# Patient Record
Sex: Female | Born: 1950 | Race: Black or African American | Hispanic: No | Marital: Married | Smoking: Never smoker
Health system: Southern US, Community
[De-identification: ages and names within clinical notes are randomized; demographics above are authoritative.]

## PROBLEM LIST (undated history)

## (undated) DIAGNOSIS — D649 Anemia, unspecified: Secondary | ICD-10-CM

## (undated) DIAGNOSIS — Z5189 Encounter for other specified aftercare: Secondary | ICD-10-CM

## (undated) DIAGNOSIS — L309 Dermatitis, unspecified: Secondary | ICD-10-CM

## (undated) DIAGNOSIS — S22000A Wedge compression fracture of unspecified thoracic vertebra, initial encounter for closed fracture: Secondary | ICD-10-CM

## (undated) DIAGNOSIS — R000 Tachycardia, unspecified: Secondary | ICD-10-CM

## (undated) DIAGNOSIS — Z9481 Bone marrow transplant status: Secondary | ICD-10-CM

## (undated) DIAGNOSIS — IMO0001 Reserved for inherently not codable concepts without codable children: Secondary | ICD-10-CM

## (undated) DIAGNOSIS — I2699 Other pulmonary embolism without acute cor pulmonale: Secondary | ICD-10-CM

## (undated) DIAGNOSIS — C801 Malignant (primary) neoplasm, unspecified: Secondary | ICD-10-CM

## (undated) HISTORY — DX: Reserved for inherently not codable concepts without codable children: IMO0001

## (undated) HISTORY — DX: Tachycardia, unspecified: R00.0

## (undated) HISTORY — DX: Bone marrow transplant status: Z94.81

## (undated) HISTORY — DX: Anemia, unspecified: D64.9

## (undated) HISTORY — DX: Dermatitis, unspecified: L30.9

## (undated) HISTORY — DX: Encounter for other specified aftercare: Z51.89

## (undated) HISTORY — DX: Malignant (primary) neoplasm, unspecified: C80.1

## (undated) HISTORY — DX: Other pulmonary embolism without acute cor pulmonale: I26.99

## (undated) HISTORY — DX: Wedge compression fracture of unspecified thoracic vertebra, initial encounter for closed fracture: S22.000A

---

## 2007-03-06 DIAGNOSIS — C801 Malignant (primary) neoplasm, unspecified: Secondary | ICD-10-CM

## 2007-03-06 HISTORY — DX: Malignant (primary) neoplasm, unspecified: C80.1

## 2007-10-04 DIAGNOSIS — S22000A Wedge compression fracture of unspecified thoracic vertebra, initial encounter for closed fracture: Secondary | ICD-10-CM

## 2007-10-04 HISTORY — DX: Wedge compression fracture of unspecified thoracic vertebra, initial encounter for closed fracture: S22.000A

## 2007-10-30 ENCOUNTER — Ambulatory Visit: Payer: Self-pay | Admitting: Oncology

## 2007-10-30 LAB — CBC WITH DIFFERENTIAL/PLATELET
Basophils Absolute: 0 10*3/uL (ref 0.0–0.1)
Eosinophils Absolute: 0.1 10*3/uL (ref 0.0–0.5)
HGB: 7.6 g/dL — ABNORMAL LOW (ref 11.6–15.9)
LYMPH%: 31.2 % (ref 14.0–48.0)
MCV: 88.7 fL (ref 81.0–101.0)
MONO#: 0.6 10*3/uL (ref 0.1–0.9)
NEUT#: 3.4 10*3/uL (ref 1.5–6.5)
Platelets: 342 10*3/uL (ref 145–400)
RBC: 2.54 10*6/uL — ABNORMAL LOW (ref 3.70–5.32)
RDW: 16.5 % — ABNORMAL HIGH (ref 11.3–14.5)
WBC: 6 10*3/uL (ref 3.9–10.0)

## 2007-10-30 LAB — CHCC SMEAR

## 2007-10-31 ENCOUNTER — Ambulatory Visit (HOSPITAL_COMMUNITY): Admission: RE | Admit: 2007-10-31 | Discharge: 2007-10-31 | Payer: Self-pay | Admitting: Oncology

## 2007-10-31 ENCOUNTER — Other Ambulatory Visit: Admission: RE | Admit: 2007-10-31 | Discharge: 2007-10-31 | Payer: Self-pay | Admitting: Oncology

## 2007-10-31 ENCOUNTER — Encounter: Payer: Self-pay | Admitting: Oncology

## 2007-10-31 LAB — URINALYSIS, MICROSCOPIC - CHCC
Bilirubin (Urine): NEGATIVE
Specific Gravity, Urine: 1.025 (ref 1.003–1.035)
pH: 6 (ref 4.6–8.0)

## 2007-10-31 LAB — COMPREHENSIVE METABOLIC PANEL
Alkaline Phosphatase: 46 U/L (ref 39–117)
Creatinine, Ser: 1.42 mg/dL — ABNORMAL HIGH (ref 0.40–1.20)
Glucose, Bld: 110 mg/dL — ABNORMAL HIGH (ref 70–99)
Sodium: 130 mEq/L — ABNORMAL LOW (ref 135–145)
Total Bilirubin: 0.6 mg/dL (ref 0.3–1.2)
Total Protein: 12.7 g/dL — ABNORMAL HIGH (ref 6.0–8.3)

## 2007-11-04 ENCOUNTER — Encounter (HOSPITAL_COMMUNITY): Admission: RE | Admit: 2007-11-04 | Discharge: 2007-11-22 | Payer: Self-pay | Admitting: Oncology

## 2007-11-04 LAB — TYPE & CROSSMATCH - CHCC

## 2007-11-04 LAB — SPEP & IFE WITH QIG
Alpha-2-Globulin: 7.6 % (ref 7.1–11.8)
Gamma Globulin: 51.9 % — ABNORMAL HIGH (ref 11.1–18.8)
IgG (Immunoglobin G), Serum: 10800 mg/dL — ABNORMAL HIGH (ref 694–1618)
IgM, Serum: 31 mg/dL — ABNORMAL LOW (ref 60–263)
M-Spike, %: 6.62 g/dL

## 2007-11-04 LAB — COMPREHENSIVE METABOLIC PANEL

## 2007-11-04 LAB — KAPPA/LAMBDA LIGHT CHAINS
Kappa free light chain: 141 mg/dL — ABNORMAL HIGH (ref 0.33–1.94)
Kappa:Lambda Ratio: 67.79 — ABNORMAL HIGH (ref 0.26–1.65)

## 2007-11-04 LAB — PREGNANCY, URINE: Preg Test, Ur: NEGATIVE

## 2007-11-04 LAB — BETA 2 MICROGLOBULIN, SERUM: Beta-2 Microglobulin: 4.07 mg/L — ABNORMAL HIGH (ref 1.01–1.73)

## 2007-11-04 LAB — WHOLE BLOOD GLUCOSE: HRS PC: 1 Hours

## 2007-11-13 LAB — CBC WITH DIFFERENTIAL/PLATELET
BASO%: 2.9 % — ABNORMAL HIGH (ref 0.0–2.0)
Basophils Absolute: 0.2 10*3/uL — ABNORMAL HIGH (ref 0.0–0.1)
EOS%: 2.6 % (ref 0.0–7.0)
HGB: 10.2 g/dL — ABNORMAL LOW (ref 11.6–15.9)
MCH: 28.1 pg (ref 26.0–34.0)
MCHC: 32.4 g/dL (ref 32.0–36.0)
MCV: 86.7 fL (ref 81.0–101.0)
MONO%: 9.4 % (ref 0.0–13.0)
NEUT%: 70.6 % (ref 39.6–76.8)
RDW: 16.7 % — ABNORMAL HIGH (ref 11.3–14.5)
lymph#: 1.2 10*3/uL (ref 0.9–3.3)

## 2007-11-20 ENCOUNTER — Ambulatory Visit (HOSPITAL_COMMUNITY): Admission: RE | Admit: 2007-11-20 | Discharge: 2007-11-20 | Payer: Self-pay | Admitting: Oncology

## 2007-11-20 DIAGNOSIS — I2699 Other pulmonary embolism without acute cor pulmonale: Secondary | ICD-10-CM

## 2007-11-20 HISTORY — DX: Other pulmonary embolism without acute cor pulmonale: I26.99

## 2007-11-20 LAB — CBC WITH DIFFERENTIAL/PLATELET
BASO%: 1 % (ref 0.0–2.0)
EOS%: 2.5 % (ref 0.0–7.0)
LYMPH%: 21.6 % (ref 14.0–48.0)
MCHC: 32.8 g/dL (ref 32.0–36.0)
MCV: 86.5 fL (ref 81.0–101.0)
MONO%: 8 % (ref 0.0–13.0)
Platelets: 299 10*3/uL (ref 145–400)
RBC: 3.6 10*6/uL — ABNORMAL LOW (ref 3.70–5.32)
RDW: 16.8 % — ABNORMAL HIGH (ref 11.3–14.5)
WBC: 6 10*3/uL (ref 3.9–10.0)

## 2007-11-20 LAB — COMPREHENSIVE METABOLIC PANEL
ALT: 9 U/L (ref 0–35)
BUN: 11 mg/dL (ref 6–23)
CO2: 20 mEq/L (ref 19–32)
Creatinine, Ser: 0.91 mg/dL (ref 0.40–1.20)
Total Bilirubin: 0.4 mg/dL (ref 0.3–1.2)

## 2007-11-20 LAB — TSH: TSH: 1.32 u[IU]/mL (ref 0.350–4.500)

## 2007-11-21 ENCOUNTER — Encounter: Payer: Self-pay | Admitting: Oncology

## 2007-11-21 ENCOUNTER — Ambulatory Visit: Payer: Self-pay | Admitting: *Deleted

## 2007-11-21 ENCOUNTER — Ambulatory Visit: Admission: RE | Admit: 2007-11-21 | Discharge: 2007-11-21 | Payer: Self-pay | Admitting: Oncology

## 2007-11-25 LAB — CBC WITH DIFFERENTIAL/PLATELET
Basophils Absolute: 0.1 10*3/uL (ref 0.0–0.1)
EOS%: 6 % (ref 0.0–7.0)
Eosinophils Absolute: 0.3 10*3/uL (ref 0.0–0.5)
HCT: 31.1 % — ABNORMAL LOW (ref 34.8–46.6)
HGB: 10.1 g/dL — ABNORMAL LOW (ref 11.6–15.9)
MCH: 28.5 pg (ref 26.0–34.0)
MONO#: 0.4 10*3/uL (ref 0.1–0.9)
NEUT#: 3.4 10*3/uL (ref 1.5–6.5)
NEUT%: 61.1 % (ref 39.6–76.8)
RDW: 16.7 % — ABNORMAL HIGH (ref 11.3–14.5)
lymph#: 1.4 10*3/uL (ref 0.9–3.3)

## 2007-11-25 LAB — PROTIME-INR: INR: 1.2 — ABNORMAL LOW (ref 2.00–3.50)

## 2007-11-27 ENCOUNTER — Encounter: Payer: Self-pay | Admitting: Family Medicine

## 2007-11-27 LAB — PROTIME-INR
INR: 1.8 — ABNORMAL LOW (ref 2.00–3.50)
Protime: 21.6 Seconds — ABNORMAL HIGH (ref 10.6–13.4)

## 2007-11-28 LAB — PROTIME-INR: Protime: 28.8 Seconds — ABNORMAL HIGH (ref 10.6–13.4)

## 2007-12-01 LAB — PROTIME-INR: Protime: 26.4 Seconds — ABNORMAL HIGH (ref 10.6–13.4)

## 2007-12-05 ENCOUNTER — Encounter (INDEPENDENT_AMBULATORY_CARE_PROVIDER_SITE_OTHER): Payer: Self-pay | Admitting: Interventional Radiology

## 2007-12-05 ENCOUNTER — Ambulatory Visit (HOSPITAL_COMMUNITY): Admission: RE | Admit: 2007-12-05 | Discharge: 2007-12-05 | Payer: Self-pay | Admitting: Interventional Radiology

## 2007-12-05 HISTORY — PX: FIXATION KYPHOPLASTY THORACIC SPINE: SHX1643

## 2007-12-05 LAB — PROTIME-INR

## 2007-12-09 LAB — PROTIME-INR

## 2007-12-09 LAB — PREGNANCY, URINE: Preg Test, Ur: NEGATIVE

## 2007-12-11 LAB — COMPREHENSIVE METABOLIC PANEL
ALT: 11 U/L (ref 0–35)
AST: 12 U/L (ref 0–37)
Alkaline Phosphatase: 79 U/L (ref 39–117)
Chloride: 104 mEq/L (ref 96–112)
Creatinine, Ser: 0.81 mg/dL (ref 0.40–1.20)
Total Bilirubin: 0.3 mg/dL (ref 0.3–1.2)

## 2007-12-11 LAB — PROTIME-INR
INR: 1.4 — ABNORMAL LOW (ref 2.00–3.50)
Protime: 16.8 Seconds — ABNORMAL HIGH (ref 10.6–13.4)

## 2007-12-11 LAB — CBC WITH DIFFERENTIAL/PLATELET
BASO%: 1 % (ref 0.0–2.0)
EOS%: 0.7 % (ref 0.0–7.0)
HCT: 35.8 % (ref 34.8–46.6)
MCH: 28.4 pg (ref 26.0–34.0)
MCHC: 32.6 g/dL (ref 32.0–36.0)
NEUT%: 72.6 % (ref 39.6–76.8)
RBC: 4.11 10*6/uL (ref 3.70–5.32)
WBC: 5.5 10*3/uL (ref 3.9–10.0)
lymph#: 1.1 10*3/uL (ref 0.9–3.3)

## 2007-12-17 ENCOUNTER — Ambulatory Visit: Payer: Self-pay | Admitting: Oncology

## 2007-12-18 LAB — CBC WITH DIFFERENTIAL/PLATELET
BASO%: 0.9 % (ref 0.0–2.0)
Basophils Absolute: 0.1 10*3/uL (ref 0.0–0.1)
EOS%: 3.5 % (ref 0.0–7.0)
HCT: 35.7 % (ref 34.8–46.6)
LYMPH%: 29.4 % (ref 14.0–48.0)
MCH: 28.3 pg (ref 26.0–34.0)
MCHC: 32.6 g/dL (ref 32.0–36.0)
MCV: 86.9 fL (ref 81.0–101.0)
NEUT%: 60.3 % (ref 39.6–76.8)
Platelets: 257 10*3/uL (ref 145–400)

## 2007-12-19 ENCOUNTER — Encounter: Payer: Self-pay | Admitting: Interventional Radiology

## 2007-12-25 LAB — CBC WITH DIFFERENTIAL/PLATELET
BASO%: 1.5 % (ref 0.0–2.0)
Basophils Absolute: 0.1 10*3/uL (ref 0.0–0.1)
EOS%: 10.6 % — ABNORMAL HIGH (ref 0.0–7.0)
HCT: 32.9 % — ABNORMAL LOW (ref 34.8–46.6)
HGB: 11.1 g/dL — ABNORMAL LOW (ref 11.6–15.9)
LYMPH%: 32.6 % (ref 14.0–48.0)
MCH: 28.4 pg (ref 26.0–34.0)
MCHC: 33.6 g/dL (ref 32.0–36.0)
MONO#: 0.7 10*3/uL (ref 0.1–0.9)
NEUT%: 41.6 % (ref 39.6–76.8)
Platelets: 310 10*3/uL (ref 145–400)

## 2007-12-25 LAB — PROTIME-INR

## 2007-12-30 LAB — CBC WITH DIFFERENTIAL/PLATELET
BASO%: 0.2 % (ref 0.0–2.0)
Basophils Absolute: 0 10*3/uL (ref 0.0–0.1)
HCT: 32.9 % — ABNORMAL LOW (ref 34.8–46.6)
HGB: 11 g/dL — ABNORMAL LOW (ref 11.6–15.9)
LYMPH%: 27.5 % (ref 14.0–48.0)
MCHC: 33.6 g/dL (ref 32.0–36.0)
MONO#: 0.7 10*3/uL (ref 0.1–0.9)
NEUT%: 55.3 % (ref 39.6–76.8)
Platelets: 259 10*3/uL (ref 145–400)
WBC: 6.1 10*3/uL (ref 3.9–10.0)
lymph#: 1.7 10*3/uL (ref 0.9–3.3)

## 2008-01-01 LAB — PROTEIN ELECTROPHORESIS, SERUM
Albumin ELP: 46.2 % — ABNORMAL LOW (ref 55.8–66.1)
Alpha-1-Globulin: 5.4 % — ABNORMAL HIGH (ref 2.9–4.9)
Alpha-2-Globulin: 13.4 % — ABNORMAL HIGH (ref 7.1–11.8)
M-Spike, %: 2.02 g/dL
Total Protein, Serum Electrophoresis: 7.9 g/dL (ref 6.0–8.3)

## 2008-01-01 LAB — COMPREHENSIVE METABOLIC PANEL
ALT: 11 U/L (ref 0–35)
BUN: 9 mg/dL (ref 6–23)
CO2: 23 mEq/L (ref 19–32)
Calcium: 8.2 mg/dL — ABNORMAL LOW (ref 8.4–10.5)
Chloride: 106 mEq/L (ref 96–112)
Creatinine, Ser: 0.81 mg/dL (ref 0.40–1.20)
Glucose, Bld: 77 mg/dL (ref 70–99)

## 2008-01-01 LAB — KAPPA/LAMBDA LIGHT CHAINS
Kappa free light chain: 14.3 mg/dL — ABNORMAL HIGH (ref 0.33–1.94)
Kappa:Lambda Ratio: 8.72 — ABNORMAL HIGH (ref 0.26–1.65)
Lambda Free Lght Chn: 1.64 mg/dL (ref 0.57–2.63)

## 2008-01-08 LAB — CBC WITH DIFFERENTIAL/PLATELET
BASO%: 2.9 % — ABNORMAL HIGH (ref 0.0–2.0)
EOS%: 1.7 % (ref 0.0–7.0)
HCT: 35.8 % (ref 34.8–46.6)
HGB: 11.6 g/dL (ref 11.6–15.9)
MCH: 28.2 pg (ref 26.0–34.0)
MCHC: 32.5 g/dL (ref 32.0–36.0)
MONO#: 0.7 10*3/uL (ref 0.1–0.9)
RDW: 16.7 % — ABNORMAL HIGH (ref 11.3–14.5)
WBC: 5.6 10*3/uL (ref 3.9–10.0)
lymph#: 1.9 10*3/uL (ref 0.9–3.3)

## 2008-01-08 LAB — PROTIME-INR: INR: 3.3 (ref 2.00–3.50)

## 2008-01-08 LAB — PREGNANCY, URINE: Preg Test, Ur: NEGATIVE

## 2008-01-08 LAB — BASIC METABOLIC PANEL
Calcium: 8.4 mg/dL (ref 8.4–10.5)
Creatinine, Ser: 0.75 mg/dL (ref 0.40–1.20)
Sodium: 137 mEq/L (ref 135–145)

## 2008-01-15 LAB — CBC WITH DIFFERENTIAL/PLATELET
Eosinophils Absolute: 0.3 10*3/uL (ref 0.0–0.5)
HGB: 11.8 g/dL (ref 11.6–15.9)
MONO#: 0.3 10*3/uL (ref 0.1–0.9)
MONO%: 5 % (ref 0.0–13.0)
NEUT#: 3.9 10*3/uL (ref 1.5–6.5)
RBC: 4.2 10*6/uL (ref 3.70–5.32)
RDW: 16.1 % — ABNORMAL HIGH (ref 11.3–14.5)
WBC: 6.2 10*3/uL (ref 3.9–10.0)
lymph#: 1.6 10*3/uL (ref 0.9–3.3)

## 2008-01-22 LAB — CBC WITH DIFFERENTIAL/PLATELET
BASO%: 1 % (ref 0.0–2.0)
EOS%: 4.2 % (ref 0.0–7.0)
HCT: 33.5 % — ABNORMAL LOW (ref 34.8–46.6)
LYMPH%: 17.6 % (ref 14.0–48.0)
MCH: 28.8 pg (ref 26.0–34.0)
MCHC: 33.3 g/dL (ref 32.0–36.0)
MONO%: 7.1 % (ref 0.0–13.0)
NEUT%: 70.2 % (ref 39.6–76.8)
Platelets: 243 10*3/uL (ref 145–400)

## 2008-01-27 LAB — CBC WITH DIFFERENTIAL/PLATELET
BASO%: 0.4 % (ref 0.0–2.0)
EOS%: 7.3 % — ABNORMAL HIGH (ref 0.0–7.0)
HCT: 34.9 % (ref 34.8–46.6)
LYMPH%: 29.3 % (ref 14.0–48.0)
MCH: 29.2 pg (ref 26.0–34.0)
MCHC: 33.3 g/dL (ref 32.0–36.0)
MONO#: 0.6 10*3/uL (ref 0.1–0.9)
NEUT%: 52.5 % (ref 39.6–76.8)
Platelets: 218 10*3/uL (ref 145–400)

## 2008-01-27 LAB — COMPREHENSIVE METABOLIC PANEL
Albumin: 3.7 g/dL (ref 3.5–5.2)
CO2: 25 mEq/L (ref 19–32)
Glucose, Bld: 93 mg/dL (ref 70–99)
Sodium: 142 mEq/L (ref 135–145)
Total Bilirubin: 0.4 mg/dL (ref 0.3–1.2)
Total Protein: 7.4 g/dL (ref 6.0–8.3)

## 2008-02-03 ENCOUNTER — Ambulatory Visit: Payer: Self-pay | Admitting: Oncology

## 2008-02-05 LAB — CBC WITH DIFFERENTIAL/PLATELET
BASO%: 2.3 % — ABNORMAL HIGH (ref 0.0–2.0)
HCT: 35.4 % (ref 34.8–46.6)
HGB: 11.7 g/dL (ref 11.6–15.9)
MCHC: 33.1 g/dL (ref 32.0–36.0)
MONO#: 0.7 10*3/uL (ref 0.1–0.9)
NEUT#: 3.3 10*3/uL (ref 1.5–6.5)
NEUT%: 54.6 % (ref 39.6–76.8)
WBC: 6 10*3/uL (ref 3.9–10.0)
lymph#: 1.7 10*3/uL (ref 0.9–3.3)

## 2008-02-05 LAB — PREGNANCY, URINE: Preg Test, Ur: NEGATIVE

## 2008-02-05 LAB — PROTIME-INR

## 2008-02-12 LAB — CBC WITH DIFFERENTIAL/PLATELET
BASO%: 1.3 % (ref 0.0–2.0)
EOS%: 4.6 % (ref 0.0–7.0)
MCH: 28.4 pg (ref 26.0–34.0)
MCHC: 32.9 g/dL (ref 32.0–36.0)
MCV: 86.3 fL (ref 81.0–101.0)
MONO%: 6 % (ref 0.0–13.0)
RBC: 4.61 10*6/uL (ref 3.70–5.32)
RDW: 16.3 % — ABNORMAL HIGH (ref 11.3–14.5)

## 2008-02-12 LAB — PROTIME-INR
INR: 4.5 — ABNORMAL HIGH (ref 2.00–3.50)
Protime: 54 Seconds — ABNORMAL HIGH (ref 10.6–13.4)

## 2008-02-13 LAB — PROTIME-INR

## 2008-02-16 LAB — PROTIME-INR

## 2008-02-19 LAB — CBC WITH DIFFERENTIAL/PLATELET
BASO%: 1.8 % (ref 0.0–2.0)
HCT: 36.5 % (ref 34.8–46.6)
HGB: 12.1 g/dL (ref 11.6–15.9)
MCHC: 33.1 g/dL (ref 32.0–36.0)
MONO#: 0.7 10*3/uL (ref 0.1–0.9)
NEUT#: 3.4 10*3/uL (ref 1.5–6.5)
NEUT%: 57.8 % (ref 39.6–76.8)
WBC: 5.9 10*3/uL (ref 3.9–10.0)
lymph#: 1.4 10*3/uL (ref 0.9–3.3)

## 2008-02-19 LAB — PROTIME-INR

## 2008-03-11 LAB — CBC WITH DIFFERENTIAL/PLATELET
Basophils Absolute: 0.1 10*3/uL (ref 0.0–0.1)
EOS%: 1.4 % (ref 0.0–7.0)
HCT: 38.4 % (ref 34.8–46.6)
HGB: 12.8 g/dL (ref 11.6–15.9)
LYMPH%: 36.5 % (ref 14.0–48.0)
MCH: 29 pg (ref 26.0–34.0)
MCHC: 33.4 g/dL (ref 32.0–36.0)
NEUT%: 46.9 % (ref 39.6–76.8)
Platelets: 241 10*3/uL (ref 145–400)
lymph#: 1.4 10*3/uL (ref 0.9–3.3)

## 2008-03-11 LAB — COMPREHENSIVE METABOLIC PANEL
Alkaline Phosphatase: 79 U/L (ref 39–117)
BUN: 13 mg/dL (ref 6–23)
Glucose, Bld: 111 mg/dL — ABNORMAL HIGH (ref 70–99)
Sodium: 140 mEq/L (ref 135–145)
Total Bilirubin: 0.5 mg/dL (ref 0.3–1.2)

## 2008-03-11 LAB — PROTIME-INR

## 2008-03-11 LAB — PREGNANCY, URINE: Preg Test, Ur: NEGATIVE

## 2008-03-15 LAB — KAPPA/LAMBDA LIGHT CHAINS
Kappa free light chain: 10.2 mg/dL — ABNORMAL HIGH (ref 0.33–1.94)
Lambda Free Lght Chn: 1.45 mg/dL (ref 0.57–2.63)

## 2008-03-15 LAB — PROTEIN ELECTROPHORESIS, SERUM
Albumin ELP: 53.7 % — ABNORMAL LOW (ref 55.8–66.1)
Alpha-1-Globulin: 5.1 % — ABNORMAL HIGH (ref 2.9–4.9)
Alpha-2-Globulin: 14.6 % — ABNORMAL HIGH (ref 7.1–11.8)
Beta 2: 3.5 % (ref 3.2–6.5)

## 2008-03-15 LAB — IGG, IGA, IGM
IgG (Immunoglobin G), Serum: 1510 mg/dL (ref 694–1618)
IgM, Serum: 69 mg/dL (ref 60–263)

## 2008-03-18 ENCOUNTER — Ambulatory Visit: Payer: Self-pay | Admitting: Oncology

## 2008-03-18 LAB — CBC WITH DIFFERENTIAL/PLATELET
Basophils Absolute: 0.1 10*3/uL (ref 0.0–0.1)
Eosinophils Absolute: 0.2 10*3/uL (ref 0.0–0.5)
HGB: 12.4 g/dL (ref 11.6–15.9)
LYMPH%: 24 % (ref 14.0–48.0)
MCV: 86 fL (ref 81.0–101.0)
MONO%: 5.6 % (ref 0.0–13.0)
NEUT#: 3.8 10*3/uL (ref 1.5–6.5)
NEUT%: 65.7 % (ref 39.6–76.8)
Platelets: 196 10*3/uL (ref 145–400)

## 2008-03-25 LAB — PROTIME-INR

## 2008-04-08 LAB — CBC WITH DIFFERENTIAL/PLATELET
Basophils Absolute: 0 10*3/uL (ref 0.0–0.1)
EOS%: 0.5 % (ref 0.0–7.0)
Eosinophils Absolute: 0 10*3/uL (ref 0.0–0.5)
HCT: 38.8 % (ref 34.8–46.6)
HGB: 13.2 g/dL (ref 11.6–15.9)
MCH: 29 pg (ref 26.0–34.0)
MCV: 85.6 fL (ref 81.0–101.0)
MONO%: 3 % (ref 0.0–13.0)
NEUT#: 4.9 10*3/uL (ref 1.5–6.5)
NEUT%: 81.4 % — ABNORMAL HIGH (ref 39.6–76.8)
RDW: 18 % — ABNORMAL HIGH (ref 11.3–14.5)
lymph#: 0.9 10*3/uL (ref 0.9–3.3)

## 2008-04-08 LAB — PREGNANCY, URINE: Preg Test, Ur: NEGATIVE

## 2008-04-08 LAB — PROTIME-INR: INR: 1.7 — ABNORMAL LOW (ref 2.00–3.50)

## 2008-04-08 LAB — COMPREHENSIVE METABOLIC PANEL
ALT: 12 U/L (ref 0–35)
AST: 13 U/L (ref 0–37)
Albumin: 4.4 g/dL (ref 3.5–5.2)
CO2: 19 mEq/L (ref 19–32)
Calcium: 9.5 mg/dL (ref 8.4–10.5)
Chloride: 105 mEq/L (ref 96–112)
Potassium: 4 mEq/L (ref 3.5–5.3)

## 2008-04-22 LAB — CBC WITH DIFFERENTIAL/PLATELET
BASO%: 1.2 % (ref 0.0–2.0)
Basophils Absolute: 0.1 10*3/uL (ref 0.0–0.1)
EOS%: 3.2 % (ref 0.0–7.0)
HCT: 36.3 % (ref 34.8–46.6)
HGB: 12.4 g/dL (ref 11.6–15.9)
LYMPH%: 19.8 % (ref 14.0–48.0)
MCH: 29.8 pg (ref 26.0–34.0)
MCHC: 34.2 g/dL (ref 32.0–36.0)
MCV: 87.4 fL (ref 81.0–101.0)
MONO%: 7.4 % (ref 0.0–13.0)
NEUT%: 68.4 % (ref 39.6–76.8)
Platelets: 201 10*3/uL (ref 145–400)
lymph#: 1.2 10*3/uL (ref 0.9–3.3)

## 2008-04-22 LAB — PROTIME-INR

## 2008-05-04 ENCOUNTER — Ambulatory Visit: Payer: Self-pay | Admitting: Oncology

## 2008-05-06 LAB — CBC WITH DIFFERENTIAL/PLATELET
BASO%: 1.6 % (ref 0.0–2.0)
EOS%: 1.6 % (ref 0.0–7.0)
HCT: 37.2 % (ref 34.8–46.6)
HGB: 12.3 g/dL (ref 11.6–15.9)
MCHC: 33.1 g/dL (ref 31.5–36.0)
MONO#: 0.8 10*3/uL (ref 0.1–0.9)
NEUT%: 47.7 % (ref 38.4–76.8)
RDW: 16.9 % — ABNORMAL HIGH (ref 11.2–14.5)
WBC: 5 10*3/uL (ref 3.9–10.3)
lymph#: 1.7 10*3/uL (ref 0.9–3.3)

## 2008-05-06 LAB — PROTIME-INR
INR: 2 (ref 2.00–3.50)
Protime: 24 Seconds — ABNORMAL HIGH (ref 10.6–13.4)

## 2008-05-06 LAB — PREGNANCY, URINE: Preg Test, Ur: NEGATIVE

## 2008-05-10 LAB — COMPREHENSIVE METABOLIC PANEL
ALT: 11 U/L (ref 0–35)
AST: 10 U/L (ref 0–37)
Alkaline Phosphatase: 51 U/L (ref 39–117)
BUN: 15 mg/dL (ref 6–23)
Calcium: 9 mg/dL (ref 8.4–10.5)
Chloride: 106 mEq/L (ref 96–112)
Creatinine, Ser: 0.89 mg/dL (ref 0.40–1.20)
Total Bilirubin: 0.5 mg/dL (ref 0.3–1.2)

## 2008-05-10 LAB — PROTEIN ELECTROPHORESIS, SERUM
Alpha-1-Globulin: 5.4 % — ABNORMAL HIGH (ref 2.9–4.9)
Alpha-2-Globulin: 14.4 % — ABNORMAL HIGH (ref 7.1–11.8)
Beta Globulin: 5.4 % (ref 4.7–7.2)
Gamma Globulin: 17.5 % (ref 11.1–18.8)
M-Spike, %: 1 g/dL

## 2008-05-10 LAB — KAPPA/LAMBDA LIGHT CHAINS: Lambda Free Lght Chn: 2 mg/dL (ref 0.57–2.63)

## 2008-05-13 LAB — CBC WITH DIFFERENTIAL/PLATELET
BASO%: 0.4 % (ref 0.0–2.0)
Eosinophils Absolute: 0.2 10*3/uL (ref 0.0–0.5)
HCT: 37.5 % (ref 34.8–46.6)
LYMPH%: 28.5 % (ref 14.0–49.7)
MCHC: 32.5 g/dL (ref 31.5–36.0)
MONO#: 0.3 10*3/uL (ref 0.1–0.9)
NEUT#: 3 10*3/uL (ref 1.5–6.5)
Platelets: 190 10*3/uL (ref 145–400)
RBC: 4.44 10*6/uL (ref 3.70–5.45)
WBC: 4.9 10*3/uL (ref 3.9–10.3)
lymph#: 1.4 10*3/uL (ref 0.9–3.3)
nRBC: 0 % (ref 0–0)

## 2008-05-13 LAB — PROTIME-INR: INR: 1.6 — ABNORMAL LOW (ref 2.00–3.50)

## 2008-05-20 LAB — PROTIME-INR: INR: 2.1 (ref 2.00–3.50)

## 2008-05-20 LAB — CBC WITH DIFFERENTIAL/PLATELET
BASO%: 0.3 % (ref 0.0–2.0)
Basophils Absolute: 0 10*3/uL (ref 0.0–0.1)
EOS%: 2.1 % (ref 0.0–7.0)
HCT: 37.2 % (ref 34.8–46.6)
HGB: 12.1 g/dL (ref 11.6–15.9)
LYMPH%: 15.8 % (ref 14.0–49.7)
MCH: 27.5 pg (ref 25.1–34.0)
MCHC: 32.5 g/dL (ref 31.5–36.0)
MCV: 84.5 fL (ref 79.5–101.0)
MONO%: 13.3 % (ref 0.0–14.0)
NEUT%: 68.5 % (ref 38.4–76.8)
Platelets: 220 10*3/uL (ref 145–400)

## 2008-06-03 LAB — CBC WITH DIFFERENTIAL/PLATELET
BASO%: 1.5 % (ref 0.0–2.0)
HCT: 36.8 % (ref 34.8–46.6)
LYMPH%: 20.6 % (ref 14.0–49.7)
MCH: 27.4 pg (ref 25.1–34.0)
MCHC: 32.3 g/dL (ref 31.5–36.0)
MCV: 84.8 fL (ref 79.5–101.0)
MONO#: 0.5 10*3/uL (ref 0.1–0.9)
MONO%: 7.2 % (ref 0.0–14.0)
NEUT%: 69.7 % (ref 38.4–76.8)
Platelets: 397 10*3/uL (ref 145–400)
RBC: 4.34 10*6/uL (ref 3.70–5.45)
WBC: 7.3 10*3/uL (ref 3.9–10.3)
nRBC: 0 % (ref 0–0)

## 2008-06-03 LAB — PROTIME-INR

## 2008-06-22 ENCOUNTER — Ambulatory Visit: Payer: Self-pay | Admitting: Oncology

## 2008-06-24 LAB — CBC WITH DIFFERENTIAL/PLATELET
BASO%: 0.2 % (ref 0.0–2.0)
Eosinophils Absolute: 0.1 10*3/uL (ref 0.0–0.5)
HCT: 36.3 % (ref 34.8–46.6)
MCHC: 33.1 g/dL (ref 31.5–36.0)
MONO#: 0.6 10*3/uL (ref 0.1–0.9)
NEUT#: 1.9 10*3/uL (ref 1.5–6.5)
RBC: 4.28 10*6/uL (ref 3.70–5.45)
WBC: 4.4 10*3/uL (ref 3.9–10.3)
lymph#: 1.7 10*3/uL (ref 0.9–3.3)
nRBC: 0 % (ref 0–0)

## 2008-06-24 LAB — PROTIME-INR

## 2008-06-28 LAB — CBC WITH DIFFERENTIAL/PLATELET
Basophils Absolute: 0 10*3/uL (ref 0.0–0.1)
EOS%: 1.6 % (ref 0.0–7.0)
HCT: 35.2 % (ref 34.8–46.6)
HGB: 11.8 g/dL (ref 11.6–15.9)
LYMPH%: 41.3 % (ref 14.0–49.7)
MCH: 28.6 pg (ref 25.1–34.0)
MCV: 85.1 fL (ref 79.5–101.0)
MONO%: 11.9 % (ref 0.0–14.0)
NEUT%: 44.7 % (ref 38.4–76.8)
Platelets: 278 10*3/uL (ref 145–400)

## 2008-06-28 LAB — COMPREHENSIVE METABOLIC PANEL
AST: 19 U/L (ref 0–37)
Alkaline Phosphatase: 68 U/L (ref 39–117)
BUN: 13 mg/dL (ref 6–23)
Creatinine, Ser: 0.94 mg/dL (ref 0.40–1.20)
Glucose, Bld: 165 mg/dL — ABNORMAL HIGH (ref 70–99)

## 2008-06-28 LAB — PROTIME-INR
INR: 2 (ref 2.00–3.50)
Protime: 24 s — ABNORMAL HIGH (ref 10.6–13.4)

## 2008-07-01 ENCOUNTER — Other Ambulatory Visit: Admission: RE | Admit: 2008-07-01 | Discharge: 2008-07-01 | Payer: Self-pay | Admitting: Oncology

## 2008-07-01 ENCOUNTER — Encounter: Payer: Self-pay | Admitting: Oncology

## 2008-07-01 LAB — PROTIME-INR: INR: 1.3 — ABNORMAL LOW (ref 2.00–3.50)

## 2008-07-09 LAB — CBC WITH DIFFERENTIAL/PLATELET
BASO%: 0.5 % (ref 0.0–2.0)
Basophils Absolute: 0 10*3/uL (ref 0.0–0.1)
EOS%: 0.7 % (ref 0.0–7.0)
HCT: 35.5 % (ref 34.8–46.6)
HGB: 11.9 g/dL (ref 11.6–15.9)
MCH: 28.6 pg (ref 25.1–34.0)
MCHC: 33.5 g/dL (ref 31.5–36.0)
MCV: 85.5 fL (ref 79.5–101.0)
MONO%: 13.1 % (ref 0.0–14.0)
NEUT%: 43.9 % (ref 38.4–76.8)
RDW: 16.5 % — ABNORMAL HIGH (ref 11.2–14.5)

## 2008-07-09 LAB — BASIC METABOLIC PANEL
BUN: 13 mg/dL (ref 6–23)
CO2: 22 mEq/L (ref 19–32)
Chloride: 108 mEq/L (ref 96–112)
Creatinine, Ser: 0.87 mg/dL (ref 0.40–1.20)

## 2008-08-03 ENCOUNTER — Ambulatory Visit: Payer: Self-pay | Admitting: Oncology

## 2008-08-05 LAB — PROTIME-INR: Protime: 28.8 Seconds — ABNORMAL HIGH (ref 10.6–13.4)

## 2008-08-16 LAB — CBC WITH DIFFERENTIAL/PLATELET
BASO%: 0.5 % (ref 0.0–2.0)
Basophils Absolute: 0 10*3/uL (ref 0.0–0.1)
EOS%: 0.7 % (ref 0.0–7.0)
HCT: 34.9 % (ref 34.8–46.6)
HGB: 11.5 g/dL — ABNORMAL LOW (ref 11.6–15.9)
LYMPH%: 19.6 % (ref 14.0–49.7)
MCH: 27.8 pg (ref 25.1–34.0)
MCHC: 33 g/dL (ref 31.5–36.0)
MCV: 84.3 fL (ref 79.5–101.0)
MONO%: 0.8 % (ref 0.0–14.0)
NEUT%: 78.4 % — ABNORMAL HIGH (ref 38.4–76.8)
Platelets: 100 10*3/uL — ABNORMAL LOW (ref 145–400)
lymph#: 1.2 10*3/uL (ref 0.9–3.3)

## 2008-08-18 LAB — CBC WITH DIFFERENTIAL/PLATELET
EOS%: 4.2 % (ref 0.0–7.0)
Eosinophils Absolute: 0.1 10*3/uL (ref 0.0–0.5)
LYMPH%: 88.9 % — ABNORMAL HIGH (ref 14.0–49.7)
MCH: 28.8 pg (ref 25.1–34.0)
MCV: 85 fL (ref 79.5–101.0)
MONO%: 3.4 % (ref 0.0–14.0)
NEUT#: 0 10*3/uL — CL (ref 1.5–6.5)
Platelets: 49 10*3/uL — ABNORMAL LOW (ref 145–400)
RBC: 4.07 10*6/uL (ref 3.70–5.45)
RDW: 15.4 % — ABNORMAL HIGH (ref 11.2–14.5)

## 2008-08-18 LAB — PROTHROMBIN TIME: Prothrombin Time: 19.9 seconds — ABNORMAL HIGH (ref 11.6–15.2)

## 2008-09-10 DIAGNOSIS — Z9481 Bone marrow transplant status: Secondary | ICD-10-CM

## 2008-09-10 HISTORY — DX: Bone marrow transplant status: Z94.81

## 2008-10-08 ENCOUNTER — Ambulatory Visit: Payer: Self-pay | Admitting: Oncology

## 2008-10-12 LAB — CBC WITH DIFFERENTIAL/PLATELET
Basophils Absolute: 0.1 10*3/uL (ref 0.0–0.1)
Eosinophils Absolute: 0.7 10*3/uL — ABNORMAL HIGH (ref 0.0–0.5)
HCT: 35.2 % (ref 34.8–46.6)
LYMPH%: 25.9 % (ref 14.0–49.7)
MCV: 85.7 fL (ref 79.5–101.0)
MONO#: 1.6 10*3/uL — ABNORMAL HIGH (ref 0.1–0.9)
MONO%: 19.4 % — ABNORMAL HIGH (ref 0.0–14.0)
NEUT#: 3.8 10*3/uL (ref 1.5–6.5)
NEUT%: 45.8 % (ref 38.4–76.8)
Platelets: 183 10*3/uL (ref 145–400)
RBC: 4.11 10*6/uL (ref 3.70–5.45)
WBC: 8.2 10*3/uL (ref 3.9–10.3)

## 2008-10-26 LAB — CBC WITH DIFFERENTIAL/PLATELET
BASO%: 0.5 % (ref 0.0–2.0)
EOS%: 6 % (ref 0.0–7.0)
HCT: 34.6 % — ABNORMAL LOW (ref 34.8–46.6)
LYMPH%: 31.8 % (ref 14.0–49.7)
MCH: 29.4 pg (ref 25.1–34.0)
MCHC: 34.2 g/dL (ref 31.5–36.0)
MCV: 86.1 fL (ref 79.5–101.0)
MONO%: 10.2 % (ref 0.0–14.0)
NEUT%: 51.5 % (ref 38.4–76.8)
Platelets: 216 10*3/uL (ref 145–400)
RBC: 4.02 10*6/uL (ref 3.70–5.45)
WBC: 6.5 10*3/uL (ref 3.9–10.3)

## 2008-10-26 LAB — COMPREHENSIVE METABOLIC PANEL
ALT: 21 U/L (ref 0–35)
Alkaline Phosphatase: 90 U/L (ref 39–117)
CO2: 20 mEq/L (ref 19–32)
Creatinine, Ser: 1.12 mg/dL (ref 0.40–1.20)
Sodium: 140 mEq/L (ref 135–145)
Total Bilirubin: 0.5 mg/dL (ref 0.3–1.2)
Total Protein: 7.1 g/dL (ref 6.0–8.3)

## 2008-10-26 LAB — LACTATE DEHYDROGENASE: LDH: 183 U/L (ref 94–250)

## 2008-11-23 ENCOUNTER — Ambulatory Visit: Payer: Self-pay | Admitting: Oncology

## 2008-11-25 LAB — CBC WITH DIFFERENTIAL/PLATELET
Eosinophils Absolute: 0.1 10*3/uL (ref 0.0–0.5)
MCV: 87 fL (ref 79.5–101.0)
MONO#: 0.5 10*3/uL (ref 0.1–0.9)
MONO%: 9.4 % (ref 0.0–14.0)
NEUT#: 2.1 10*3/uL (ref 1.5–6.5)
RBC: 3.89 10*6/uL (ref 3.70–5.45)
RDW: 16.7 % — ABNORMAL HIGH (ref 11.2–14.5)
WBC: 4.9 10*3/uL (ref 3.9–10.3)

## 2008-12-27 ENCOUNTER — Ambulatory Visit: Payer: Self-pay | Admitting: Oncology

## 2009-01-20 LAB — BASIC METABOLIC PANEL
BUN: 10 mg/dL (ref 6–23)
Calcium: 9.3 mg/dL (ref 8.4–10.5)
Glucose, Bld: 114 mg/dL — ABNORMAL HIGH (ref 70–99)

## 2009-01-26 ENCOUNTER — Ambulatory Visit: Payer: Self-pay | Admitting: Oncology

## 2009-01-26 LAB — PREGNANCY, URINE: Preg Test, Ur: NEGATIVE

## 2009-01-26 LAB — CBC WITH DIFFERENTIAL/PLATELET
BASO%: 1 % (ref 0.0–2.0)
MCHC: 32.7 g/dL (ref 31.5–36.0)
MONO#: 0.5 10*3/uL (ref 0.1–0.9)
RBC: 4.23 10*6/uL (ref 3.70–5.45)
RDW: 13.9 % (ref 11.2–14.5)
WBC: 3.8 10*3/uL — ABNORMAL LOW (ref 3.9–10.3)
lymph#: 1.8 10*3/uL (ref 0.9–3.3)

## 2009-02-02 LAB — CBC WITH DIFFERENTIAL/PLATELET
Basophils Absolute: 0.1 10*3/uL (ref 0.0–0.1)
EOS%: 4.6 % (ref 0.0–7.0)
HGB: 12.9 g/dL (ref 11.6–15.9)
MCH: 28.9 pg (ref 25.1–34.0)
NEUT#: 1.4 10*3/uL — ABNORMAL LOW (ref 1.5–6.5)
RBC: 4.46 10*6/uL (ref 3.70–5.45)
RDW: 13.6 % (ref 11.2–14.5)
lymph#: 2 10*3/uL (ref 0.9–3.3)
nRBC: 0 % (ref 0–0)

## 2009-02-10 LAB — CBC WITH DIFFERENTIAL/PLATELET
BASO%: 2 % (ref 0.0–2.0)
Eosinophils Absolute: 0.3 10*3/uL (ref 0.0–0.5)
MCHC: 33.1 g/dL (ref 31.5–36.0)
MONO#: 0.7 10*3/uL (ref 0.1–0.9)
NEUT#: 1.2 10*3/uL — ABNORMAL LOW (ref 1.5–6.5)
Platelets: 221 10*3/uL (ref 145–400)
RBC: 4.79 10*6/uL (ref 3.70–5.45)
RDW: 13.5 % (ref 11.2–14.5)
WBC: 4.9 10*3/uL (ref 3.9–10.3)
lymph#: 2.7 10*3/uL (ref 0.9–3.3)

## 2009-02-18 LAB — CBC WITH DIFFERENTIAL/PLATELET
BASO%: 1.6 % (ref 0.0–2.0)
Eosinophils Absolute: 0.2 10*3/uL (ref 0.0–0.5)
HCT: 42.5 % (ref 34.8–46.6)
HGB: 14.1 g/dL (ref 11.6–15.9)
LYMPH%: 42.8 % (ref 14.0–49.7)
MONO#: 0.5 10*3/uL (ref 0.1–0.9)
NEUT#: 1.6 10*3/uL (ref 1.5–6.5)
NEUT%: 38.2 % — ABNORMAL LOW (ref 38.4–76.8)
Platelets: 245 10*3/uL (ref 145–400)
WBC: 4.3 10*3/uL (ref 3.9–10.3)
lymph#: 1.8 10*3/uL (ref 0.9–3.3)

## 2009-03-24 ENCOUNTER — Ambulatory Visit: Payer: Self-pay | Admitting: Oncology

## 2009-03-24 LAB — CBC WITH DIFFERENTIAL/PLATELET
BASO%: 1.4 % (ref 0.0–2.0)
EOS%: 7.1 % — ABNORMAL HIGH (ref 0.0–7.0)
LYMPH%: 43.8 % (ref 14.0–49.7)
MCH: 28.3 pg (ref 25.1–34.0)
MCHC: 32.7 g/dL (ref 31.5–36.0)
MONO#: 0.6 10*3/uL (ref 0.1–0.9)
RBC: 4.6 10*6/uL (ref 3.70–5.45)
WBC: 4.2 10*3/uL (ref 3.9–10.3)
lymph#: 1.9 10*3/uL (ref 0.9–3.3)

## 2009-03-24 LAB — PREGNANCY, URINE: Preg Test, Ur: NEGATIVE

## 2009-04-14 LAB — CBC WITH DIFFERENTIAL/PLATELET
BASO%: 1.7 % (ref 0.0–2.0)
EOS%: 9.9 % — ABNORMAL HIGH (ref 0.0–7.0)
HGB: 12.9 g/dL (ref 11.6–15.9)
MCH: 28.1 pg (ref 25.1–34.0)
MCHC: 32.6 g/dL (ref 31.5–36.0)
RDW: 14.7 % — ABNORMAL HIGH (ref 11.2–14.5)
lymph#: 1.6 10*3/uL (ref 0.9–3.3)

## 2009-04-14 LAB — BASIC METABOLIC PANEL
BUN: 11 mg/dL (ref 6–23)
Calcium: 9.3 mg/dL (ref 8.4–10.5)
Creatinine, Ser: 0.9 mg/dL (ref 0.40–1.20)

## 2009-05-18 ENCOUNTER — Ambulatory Visit: Payer: Self-pay | Admitting: Oncology

## 2009-05-20 LAB — CBC WITH DIFFERENTIAL/PLATELET
BASO%: 1.3 % (ref 0.0–2.0)
LYMPH%: 31.5 % (ref 14.0–49.7)
MCHC: 33.2 g/dL (ref 31.5–36.0)
MONO#: 0.5 10*3/uL (ref 0.1–0.9)
MONO%: 11.4 % (ref 0.0–14.0)
Platelets: 277 10*3/uL (ref 145–400)
RBC: 4.28 10*6/uL (ref 3.70–5.45)
WBC: 4.2 10*3/uL (ref 3.9–10.3)

## 2009-05-20 LAB — PREGNANCY, URINE: Preg Test, Ur: NEGATIVE

## 2009-06-16 LAB — CBC WITH DIFFERENTIAL/PLATELET
BASO%: 2.9 % — ABNORMAL HIGH (ref 0.0–2.0)
EOS%: 9.4 % — ABNORMAL HIGH (ref 0.0–7.0)
HCT: 40.1 % (ref 34.8–46.6)
LYMPH%: 51.7 % — ABNORMAL HIGH (ref 14.0–49.7)
MCH: 28.6 pg (ref 25.1–34.0)
MCHC: 32.9 g/dL (ref 31.5–36.0)
MCV: 87 fL (ref 79.5–101.0)
MONO%: 15.3 % — ABNORMAL HIGH (ref 0.0–14.0)
NEUT%: 20.7 % — ABNORMAL LOW (ref 38.4–76.8)
Platelets: 203 10*3/uL (ref 145–400)

## 2009-06-21 ENCOUNTER — Ambulatory Visit: Payer: Self-pay | Admitting: Oncology

## 2009-06-23 LAB — CBC WITH DIFFERENTIAL/PLATELET
EOS%: 10 % — ABNORMAL HIGH (ref 0.0–7.0)
Eosinophils Absolute: 0.3 10*3/uL (ref 0.0–0.5)
LYMPH%: 50.3 % — ABNORMAL HIGH (ref 14.0–49.7)
MCH: 28.4 pg (ref 25.1–34.0)
MCHC: 32.7 g/dL (ref 31.5–36.0)
MCV: 86.7 fL (ref 79.5–101.0)
MONO%: 12.4 % (ref 0.0–14.0)
NEUT#: 0.9 10*3/uL — ABNORMAL LOW (ref 1.5–6.5)
Platelets: 231 10*3/uL (ref 145–400)
RBC: 4.51 10*6/uL (ref 3.70–5.45)

## 2009-07-08 LAB — CBC WITH DIFFERENTIAL/PLATELET
BASO%: 1.2 % (ref 0.0–2.0)
Eosinophils Absolute: 0.3 10*3/uL (ref 0.0–0.5)
HCT: 41.8 % (ref 34.8–46.6)
HGB: 13.7 g/dL (ref 11.6–15.9)
LYMPH%: 49.2 % (ref 14.0–49.7)
MCHC: 32.8 g/dL (ref 31.5–36.0)
MONO#: 0.5 10*3/uL (ref 0.1–0.9)
NEUT#: 0.9 10*3/uL — ABNORMAL LOW (ref 1.5–6.5)
NEUT%: 26.3 % — ABNORMAL LOW (ref 38.4–76.8)
Platelets: 216 10*3/uL (ref 145–400)
WBC: 3.2 10*3/uL — ABNORMAL LOW (ref 3.9–10.3)
lymph#: 1.6 10*3/uL (ref 0.9–3.3)

## 2009-07-08 LAB — BASIC METABOLIC PANEL
BUN: 9 mg/dL (ref 6–23)
CO2: 25 mEq/L (ref 19–32)
Chloride: 108 mEq/L (ref 96–112)
Potassium: 3.6 mEq/L (ref 3.5–5.3)

## 2009-07-21 ENCOUNTER — Ambulatory Visit: Payer: Self-pay | Admitting: Oncology

## 2009-07-21 LAB — CBC WITH DIFFERENTIAL/PLATELET
Basophils Absolute: 0.1 10*3/uL (ref 0.0–0.1)
EOS%: 7.4 % — ABNORMAL HIGH (ref 0.0–7.0)
Eosinophils Absolute: 0.3 10*3/uL (ref 0.0–0.5)
HGB: 12.9 g/dL (ref 11.6–15.9)
LYMPH%: 44.8 % (ref 14.0–49.7)
MCH: 28.6 pg (ref 25.1–34.0)
MCV: 86.5 fL (ref 79.5–101.0)
MONO%: 14.6 % — ABNORMAL HIGH (ref 0.0–14.0)
NEUT#: 1.1 10*3/uL — ABNORMAL LOW (ref 1.5–6.5)
Platelets: 220 10*3/uL (ref 145–400)
RBC: 4.51 10*6/uL (ref 3.70–5.45)

## 2009-08-05 LAB — CBC WITH DIFFERENTIAL/PLATELET
BASO%: 1.4 % (ref 0.0–2.0)
EOS%: 6.5 % (ref 0.0–7.0)
LYMPH%: 42.5 % (ref 14.0–49.7)
MCH: 28.7 pg (ref 25.1–34.0)
MCHC: 33.4 g/dL (ref 31.5–36.0)
MONO#: 0.5 10*3/uL (ref 0.1–0.9)
Platelets: 202 10*3/uL (ref 145–400)
RBC: 4.49 10*6/uL (ref 3.70–5.45)
WBC: 3.7 10*3/uL — ABNORMAL LOW (ref 3.9–10.3)
lymph#: 1.6 10*3/uL (ref 0.9–3.3)

## 2009-08-06 LAB — PREGNANCY, URINE: Preg Test, Ur: NEGATIVE

## 2009-09-01 ENCOUNTER — Ambulatory Visit: Payer: Self-pay | Admitting: Oncology

## 2009-09-06 LAB — CBC WITH DIFFERENTIAL/PLATELET
Basophils Absolute: 0.1 10*3/uL (ref 0.0–0.1)
EOS%: 8 % — ABNORMAL HIGH (ref 0.0–7.0)
HCT: 40 % (ref 34.8–46.6)
HGB: 13.1 g/dL (ref 11.6–15.9)
LYMPH%: 47.5 % (ref 14.0–49.7)
MCH: 28.7 pg (ref 25.1–34.0)
MCV: 87.5 fL (ref 79.5–101.0)
MONO%: 14.6 % — ABNORMAL HIGH (ref 0.0–14.0)
NEUT%: 27.4 % — ABNORMAL LOW (ref 38.4–76.8)

## 2009-09-20 LAB — CBC WITH DIFFERENTIAL/PLATELET
BASO%: 2.1 % — ABNORMAL HIGH (ref 0.0–2.0)
LYMPH%: 49.2 % (ref 14.0–49.7)
MCHC: 33.7 g/dL (ref 31.5–36.0)
MCV: 86.3 fL (ref 79.5–101.0)
MONO%: 13.8 % (ref 0.0–14.0)
Platelets: 209 10*3/uL (ref 145–400)
RBC: 4.68 10*6/uL (ref 3.70–5.45)

## 2009-10-05 ENCOUNTER — Ambulatory Visit: Payer: Self-pay | Admitting: Oncology

## 2009-10-07 LAB — BASIC METABOLIC PANEL
CO2: 22 mEq/L (ref 19–32)
Chloride: 106 mEq/L (ref 96–112)
Glucose, Bld: 98 mg/dL (ref 70–99)
Potassium: 3.9 mEq/L (ref 3.5–5.3)
Sodium: 141 mEq/L (ref 135–145)

## 2009-10-07 LAB — CBC WITH DIFFERENTIAL/PLATELET
BASO%: 1.6 % (ref 0.0–2.0)
HCT: 41 % (ref 34.8–46.6)
LYMPH%: 55.1 % — ABNORMAL HIGH (ref 14.0–49.7)
MCHC: 32.9 g/dL (ref 31.5–36.0)
MONO#: 0.4 10*3/uL (ref 0.1–0.9)
NEUT%: 25.1 % — ABNORMAL LOW (ref 38.4–76.8)
Platelets: 219 10*3/uL (ref 145–400)
WBC: 3.9 10*3/uL (ref 3.9–10.3)

## 2009-11-03 ENCOUNTER — Ambulatory Visit: Payer: Self-pay | Admitting: Oncology

## 2009-11-03 ENCOUNTER — Inpatient Hospital Stay (HOSPITAL_COMMUNITY): Admission: AD | Admit: 2009-11-03 | Discharge: 2009-11-11 | Payer: Self-pay | Admitting: Oncology

## 2009-11-03 ENCOUNTER — Ambulatory Visit: Payer: Self-pay | Admitting: Internal Medicine

## 2009-11-03 LAB — URINALYSIS, MICROSCOPIC - CHCC
Ketones: NEGATIVE mg/dL
Nitrite: NEGATIVE
Protein: 300 mg/dL
Specific Gravity, Urine: 1.02 (ref 1.003–1.035)

## 2009-11-03 LAB — CBC WITH DIFFERENTIAL/PLATELET
Basophils Absolute: 0 10*3/uL (ref 0.0–0.1)
Eosinophils Absolute: 0.1 10*3/uL (ref 0.0–0.5)
HCT: 37.2 % (ref 34.8–46.6)
HGB: 12.3 g/dL (ref 11.6–15.9)
LYMPH%: 20.6 % (ref 14.0–49.7)
MCV: 87.3 fL (ref 79.5–101.0)
MONO#: 0.2 10*3/uL (ref 0.1–0.9)
MONO%: 6 % (ref 0.0–14.0)
NEUT#: 2.7 10*3/uL (ref 1.5–6.5)
NEUT%: 70.6 % (ref 38.4–76.8)
Platelets: 180 10*3/uL (ref 145–400)
RBC: 4.26 10*6/uL (ref 3.70–5.45)
WBC: 3.8 10*3/uL — ABNORMAL LOW (ref 3.9–10.3)
nRBC: 0 % (ref 0–0)

## 2009-11-05 ENCOUNTER — Ambulatory Visit: Payer: Self-pay | Admitting: Infectious Disease

## 2009-11-05 LAB — PREGNANCY, URINE: Preg Test, Ur: NEGATIVE

## 2009-11-05 LAB — URINE CULTURE

## 2009-11-14 ENCOUNTER — Ambulatory Visit: Payer: Self-pay | Admitting: Oncology

## 2009-11-17 LAB — CBC WITH DIFFERENTIAL/PLATELET
Basophils Absolute: 0.1 10*3/uL (ref 0.0–0.1)
Eosinophils Absolute: 0.1 10*3/uL (ref 0.0–0.5)
HCT: 37.1 % (ref 34.8–46.6)
HGB: 11.9 g/dL (ref 11.6–15.9)
LYMPH%: 45 % (ref 14.0–49.7)
MONO#: 0.5 10*3/uL (ref 0.1–0.9)
NEUT#: 1.9 10*3/uL (ref 1.5–6.5)
NEUT%: 41.8 % (ref 38.4–76.8)
Platelets: 466 10*3/uL — ABNORMAL HIGH (ref 145–400)
WBC: 4.5 10*3/uL (ref 3.9–10.3)
nRBC: 0 % (ref 0–0)

## 2009-12-09 LAB — CBC WITH DIFFERENTIAL/PLATELET
Basophils Absolute: 0 10*3/uL (ref 0.0–0.1)
EOS%: 3.1 % (ref 0.0–7.0)
HGB: 11.6 g/dL (ref 11.6–15.9)
MCH: 28.2 pg (ref 25.1–34.0)
MCV: 87.1 fL (ref 79.5–101.0)
MONO%: 10.3 % (ref 0.0–14.0)
NEUT#: 1.1 10*3/uL — ABNORMAL LOW (ref 1.5–6.5)
RBC: 4.12 10*6/uL (ref 3.70–5.45)
RDW: 16.2 % — ABNORMAL HIGH (ref 11.2–14.5)
lymph#: 3.1 10*3/uL (ref 0.9–3.3)

## 2009-12-20 ENCOUNTER — Ambulatory Visit: Payer: Self-pay | Admitting: Oncology

## 2009-12-22 LAB — CBC WITH DIFFERENTIAL/PLATELET
Basophils Absolute: 0 10*3/uL (ref 0.0–0.1)
Eosinophils Absolute: 0.2 10*3/uL (ref 0.0–0.5)
HGB: 12.7 g/dL (ref 11.6–15.9)
MCV: 87.6 fL (ref 79.5–101.0)
MONO#: 0.4 10*3/uL (ref 0.1–0.9)
MONO%: 7.7 % (ref 0.0–14.0)
NEUT#: 1.3 10*3/uL — ABNORMAL LOW (ref 1.5–6.5)
RBC: 4.5 10*6/uL (ref 3.70–5.45)
RDW: 16.9 % — ABNORMAL HIGH (ref 11.2–14.5)
WBC: 4.8 10*3/uL (ref 3.9–10.3)
lymph#: 2.9 10*3/uL (ref 0.9–3.3)

## 2010-01-12 LAB — PREGNANCY, URINE: Preg Test, Ur: NEGATIVE

## 2010-01-18 LAB — PROTEIN ELECTROPHORESIS, SERUM
Alpha-2-Globulin: 12.3 % — ABNORMAL HIGH (ref 7.1–11.8)
Gamma Globulin: 17.7 % (ref 11.1–18.8)
Total Protein, Serum Electrophoresis: 7.4 g/dL (ref 6.0–8.3)

## 2010-01-18 LAB — BASIC METABOLIC PANEL
BUN: 11 mg/dL (ref 6–23)
Calcium: 9.2 mg/dL (ref 8.4–10.5)
Creatinine, Ser: 0.92 mg/dL (ref 0.40–1.20)
Glucose, Bld: 112 mg/dL — ABNORMAL HIGH (ref 70–99)
Potassium: 3.8 mEq/L (ref 3.5–5.3)

## 2010-01-18 LAB — KAPPA/LAMBDA LIGHT CHAINS
Kappa free light chain: 1.71 mg/dL (ref 0.33–1.94)
Lambda Free Lght Chn: 2.4 mg/dL (ref 0.57–2.63)

## 2010-02-10 LAB — CBC WITH DIFFERENTIAL/PLATELET
BASO%: 1.3 % (ref 0.0–2.0)
Eosinophils Absolute: 0.2 10*3/uL (ref 0.0–0.5)
LYMPH%: 56.5 % — ABNORMAL HIGH (ref 14.0–49.7)
MCHC: 33.2 g/dL (ref 31.5–36.0)
MONO#: 0.4 10*3/uL (ref 0.1–0.9)
MONO%: 9.4 % (ref 0.0–14.0)
NEUT#: 1.1 10*3/uL — ABNORMAL LOW (ref 1.5–6.5)
RBC: 4.79 10*6/uL (ref 3.70–5.45)
RDW: 15.4 % — ABNORMAL HIGH (ref 11.2–14.5)
WBC: 4 10*3/uL (ref 3.9–10.3)

## 2010-02-10 LAB — PREGNANCY, URINE: Preg Test, Ur: NEGATIVE

## 2010-02-15 ENCOUNTER — Ambulatory Visit (HOSPITAL_BASED_OUTPATIENT_CLINIC_OR_DEPARTMENT_OTHER): Payer: BC Managed Care – PPO | Admitting: Oncology

## 2010-03-09 LAB — CBC WITH DIFFERENTIAL/PLATELET
BASO%: 1.4 % (ref 0.0–2.0)
Basophils Absolute: 0.1 10*3/uL (ref 0.0–0.1)
EOS%: 5.6 % (ref 0.0–7.0)
Eosinophils Absolute: 0.2 10*3/uL (ref 0.0–0.5)
HCT: 40.7 % (ref 34.8–46.6)
HGB: 13.6 g/dL (ref 11.6–15.9)
LYMPH%: 53.4 % — ABNORMAL HIGH (ref 14.0–49.7)
MCH: 29.1 pg (ref 25.1–34.0)
MCHC: 33.4 g/dL (ref 31.5–36.0)
MCV: 87.2 fL (ref 79.5–101.0)
MONO#: 0.4 10*3/uL (ref 0.1–0.9)
MONO%: 9.8 % (ref 0.0–14.0)
NEUT#: 1.1 10*3/uL — ABNORMAL LOW (ref 1.5–6.5)
NEUT%: 29.8 % — ABNORMAL LOW (ref 38.4–76.8)
Platelets: 240 10*3/uL (ref 145–400)
RBC: 4.67 10*6/uL (ref 3.70–5.45)
RDW: 15 % — ABNORMAL HIGH (ref 11.2–14.5)
WBC: 3.6 10*3/uL — ABNORMAL LOW (ref 3.9–10.3)
lymph#: 1.9 10*3/uL (ref 0.9–3.3)
nRBC: 0 % (ref 0–0)

## 2010-03-09 LAB — PREGNANCY, URINE: Preg Test, Ur: NEGATIVE

## 2010-04-06 ENCOUNTER — Ambulatory Visit (HOSPITAL_BASED_OUTPATIENT_CLINIC_OR_DEPARTMENT_OTHER): Payer: BC Managed Care – PPO | Admitting: Oncology

## 2010-04-06 DIAGNOSIS — D638 Anemia in other chronic diseases classified elsewhere: Secondary | ICD-10-CM

## 2010-04-06 DIAGNOSIS — Z86718 Personal history of other venous thrombosis and embolism: Secondary | ICD-10-CM

## 2010-04-06 DIAGNOSIS — C9000 Multiple myeloma not having achieved remission: Secondary | ICD-10-CM

## 2010-04-06 DIAGNOSIS — D709 Neutropenia, unspecified: Secondary | ICD-10-CM

## 2010-04-06 LAB — CBC WITH DIFFERENTIAL/PLATELET
BASO%: 2 % (ref 0.0–2.0)
EOS%: 13.8 % — ABNORMAL HIGH (ref 0.0–7.0)
HGB: 14.3 g/dL (ref 11.6–15.9)
MCH: 28.8 pg (ref 25.1–34.0)
MCHC: 33.3 g/dL (ref 31.5–36.0)
MONO#: 0.4 10*3/uL (ref 0.1–0.9)
RDW: 14.4 % (ref 11.2–14.5)
WBC: 4 10*3/uL (ref 3.9–10.3)
lymph#: 2 10*3/uL (ref 0.9–3.3)

## 2010-04-06 LAB — BASIC METABOLIC PANEL
Chloride: 105 mEq/L (ref 96–112)
Potassium: 4 mEq/L (ref 3.5–5.3)
Sodium: 138 mEq/L (ref 135–145)

## 2010-05-04 ENCOUNTER — Encounter (HOSPITAL_BASED_OUTPATIENT_CLINIC_OR_DEPARTMENT_OTHER): Payer: BC Managed Care – PPO | Admitting: Oncology

## 2010-05-04 ENCOUNTER — Other Ambulatory Visit: Payer: Self-pay | Admitting: Oncology

## 2010-05-04 DIAGNOSIS — D709 Neutropenia, unspecified: Secondary | ICD-10-CM

## 2010-05-04 DIAGNOSIS — C9000 Multiple myeloma not having achieved remission: Secondary | ICD-10-CM

## 2010-05-04 DIAGNOSIS — Z86718 Personal history of other venous thrombosis and embolism: Secondary | ICD-10-CM

## 2010-05-04 DIAGNOSIS — D638 Anemia in other chronic diseases classified elsewhere: Secondary | ICD-10-CM

## 2010-05-04 LAB — CBC WITH DIFFERENTIAL/PLATELET
BASO%: 2 % (ref 0.0–2.0)
EOS%: 7.7 % — ABNORMAL HIGH (ref 0.0–7.0)
MCH: 28.6 pg (ref 25.1–34.0)
MCHC: 33.2 g/dL (ref 31.5–36.0)
MONO#: 0.6 10*3/uL (ref 0.1–0.9)
NEUT%: 23.3 % — ABNORMAL LOW (ref 38.4–76.8)
RBC: 4.79 10*6/uL (ref 3.70–5.45)
RDW: 14.8 % — ABNORMAL HIGH (ref 11.2–14.5)
WBC: 4.4 10*3/uL (ref 3.9–10.3)
lymph#: 2.4 10*3/uL (ref 0.9–3.3)
nRBC: 0 % (ref 0–0)

## 2010-05-04 LAB — PREGNANCY, URINE: Preg Test, Ur: NEGATIVE

## 2010-05-18 LAB — GLUCOSE, CAPILLARY
Glucose-Capillary: 117 mg/dL — ABNORMAL HIGH (ref 70–99)
Glucose-Capillary: 119 mg/dL — ABNORMAL HIGH (ref 70–99)
Glucose-Capillary: 119 mg/dL — ABNORMAL HIGH (ref 70–99)
Glucose-Capillary: 119 mg/dL — ABNORMAL HIGH (ref 70–99)
Glucose-Capillary: 120 mg/dL — ABNORMAL HIGH (ref 70–99)
Glucose-Capillary: 135 mg/dL — ABNORMAL HIGH (ref 70–99)
Glucose-Capillary: 136 mg/dL — ABNORMAL HIGH (ref 70–99)
Glucose-Capillary: 140 mg/dL — ABNORMAL HIGH (ref 70–99)
Glucose-Capillary: 142 mg/dL — ABNORMAL HIGH (ref 70–99)
Glucose-Capillary: 143 mg/dL — ABNORMAL HIGH (ref 70–99)
Glucose-Capillary: 144 mg/dL — ABNORMAL HIGH (ref 70–99)
Glucose-Capillary: 145 mg/dL — ABNORMAL HIGH (ref 70–99)
Glucose-Capillary: 145 mg/dL — ABNORMAL HIGH (ref 70–99)
Glucose-Capillary: 147 mg/dL — ABNORMAL HIGH (ref 70–99)
Glucose-Capillary: 158 mg/dL — ABNORMAL HIGH (ref 70–99)
Glucose-Capillary: 159 mg/dL — ABNORMAL HIGH (ref 70–99)
Glucose-Capillary: 166 mg/dL — ABNORMAL HIGH (ref 70–99)
Glucose-Capillary: 167 mg/dL — ABNORMAL HIGH (ref 70–99)
Glucose-Capillary: 173 mg/dL — ABNORMAL HIGH (ref 70–99)
Glucose-Capillary: 179 mg/dL — ABNORMAL HIGH (ref 70–99)
Glucose-Capillary: 179 mg/dL — ABNORMAL HIGH (ref 70–99)
Glucose-Capillary: 192 mg/dL — ABNORMAL HIGH (ref 70–99)
Glucose-Capillary: 201 mg/dL — ABNORMAL HIGH (ref 70–99)

## 2010-05-18 LAB — BLOOD GAS, ARTERIAL
Acid-base deficit: 7.3 mmol/L — ABNORMAL HIGH (ref 0.0–2.0)
Acid-base deficit: 8 mmol/L — ABNORMAL HIGH (ref 0.0–2.0)
Bicarbonate: 15.9 mEq/L — ABNORMAL LOW (ref 20.0–24.0)
Bicarbonate: 16.1 mEq/L — ABNORMAL LOW (ref 20.0–24.0)
Bicarbonate: 16.1 mEq/L — ABNORMAL LOW (ref 20.0–24.0)
Drawn by: 129801
Drawn by: 326301
FIO2: 0.4 %
FIO2: 1 %
MECHVT: 490 mL
MECHVT: 490 mL
PEEP: 5 cmH2O
PEEP: 5 cmH2O
Patient temperature: 103
Patient temperature: 98.6
RATE: 20 resp/min
TCO2: 14.4 mmol/L (ref 0–100)
TCO2: 14.9 mmol/L (ref 0–100)
TCO2: 14.9 mmol/L (ref 0–100)
pCO2 arterial: 24.6 mmHg — ABNORMAL LOW (ref 35.0–45.0)
pCO2 arterial: 28.2 mmHg — ABNORMAL LOW (ref 35.0–45.0)
pH, Arterial: 7.319 — ABNORMAL LOW (ref 7.350–7.400)
pH, Arterial: 7.4 (ref 7.350–7.400)
pH, Arterial: 7.432 — ABNORMAL HIGH (ref 7.350–7.400)
pO2, Arterial: 115 mmHg — ABNORMAL HIGH (ref 80.0–100.0)
pO2, Arterial: 59.3 mmHg — ABNORMAL LOW (ref 80.0–100.0)

## 2010-05-18 LAB — COMPREHENSIVE METABOLIC PANEL
ALT: 34 U/L (ref 0–35)
ALT: 45 U/L — ABNORMAL HIGH (ref 0–35)
AST: 80 U/L — ABNORMAL HIGH (ref 0–37)
Albumin: 1.7 g/dL — ABNORMAL LOW (ref 3.5–5.2)
Albumin: 1.7 g/dL — ABNORMAL LOW (ref 3.5–5.2)
Albumin: 2.8 g/dL — ABNORMAL LOW (ref 3.5–5.2)
Alkaline Phosphatase: 39 U/L (ref 39–117)
Alkaline Phosphatase: 54 U/L (ref 39–117)
Alkaline Phosphatase: 63 U/L (ref 39–117)
Alkaline Phosphatase: 80 U/L (ref 39–117)
Alkaline Phosphatase: 97 U/L (ref 39–117)
BUN: 10 mg/dL (ref 6–23)
BUN: 11 mg/dL (ref 6–23)
BUN: 16 mg/dL (ref 6–23)
BUN: 17 mg/dL (ref 6–23)
CO2: 18 mEq/L — ABNORMAL LOW (ref 19–32)
CO2: 22 mEq/L (ref 19–32)
Calcium: 6.2 mg/dL — CL (ref 8.4–10.5)
Chloride: 112 mEq/L (ref 96–112)
Chloride: 99 mEq/L (ref 96–112)
Creatinine, Ser: 0.88 mg/dL (ref 0.4–1.2)
GFR calc Af Amer: 51 mL/min — ABNORMAL LOW (ref >60)
GFR calc non Af Amer: 42 mL/min — ABNORMAL LOW (ref >60)
GFR calc non Af Amer: >60 mL/min (ref >60)
Glucose, Bld: 122 mg/dL — ABNORMAL HIGH (ref 70–99)
Glucose, Bld: 133 mg/dL — ABNORMAL HIGH (ref 70–99)
Glucose, Bld: 175 mg/dL — ABNORMAL HIGH (ref 70–99)
Glucose, Bld: 197 mg/dL — ABNORMAL HIGH (ref 70–99)
Potassium: 3.1 mEq/L — ABNORMAL LOW (ref 3.5–5.1)
Potassium: 3.2 mEq/L — ABNORMAL LOW (ref 3.5–5.1)
Potassium: 3.5 mEq/L (ref 3.5–5.1)
Potassium: 3.5 mEq/L (ref 3.5–5.1)
Potassium: 3.7 mEq/L (ref 3.5–5.1)
Sodium: 131 mEq/L — ABNORMAL LOW (ref 135–145)
Sodium: 139 mEq/L (ref 135–145)
Total Bilirubin: 1.6 mg/dL — ABNORMAL HIGH (ref 0.3–1.2)
Total Bilirubin: 1.8 mg/dL — ABNORMAL HIGH (ref 0.3–1.2)
Total Bilirubin: 3.5 mg/dL — ABNORMAL HIGH (ref 0.3–1.2)
Total Protein: 5.4 g/dL — ABNORMAL LOW (ref 6.0–8.3)
Total Protein: 5.6 g/dL — ABNORMAL LOW (ref 6.0–8.3)
Total Protein: 6 g/dL (ref 6.0–8.3)

## 2010-05-18 LAB — CULTURE, BAL-QUANTITATIVE W GRAM STAIN
Colony Count: NO GROWTH
Culture: NO GROWTH
Culture: NO GROWTH

## 2010-05-18 LAB — URINE CULTURE
Culture: NO GROWTH
Special Requests: NEGATIVE

## 2010-05-18 LAB — EHRLICHIA ANTIBODY PANEL
E chaffeensis (HGE) Ab, IgG: <1:64 {titer}
E chaffeensis (HGE) Ab, IgM: <1:20 {titer}

## 2010-05-18 LAB — URINALYSIS, ROUTINE W REFLEX MICROSCOPIC
Ketones, ur: NEGATIVE mg/dL
Nitrite: NEGATIVE
Protein, ur: 100 mg/dL — AB
Urobilinogen, UA: 0.2 mg/dL (ref 0.0–1.0)

## 2010-05-18 LAB — CBC
HCT: 25.8 % — ABNORMAL LOW (ref 36.0–46.0)
HCT: 27.1 % — ABNORMAL LOW (ref 36.0–46.0)
HCT: 27.7 % — ABNORMAL LOW (ref 36.0–46.0)
HCT: 27.8 % — ABNORMAL LOW (ref 36.0–46.0)
HCT: 31.3 % — ABNORMAL LOW (ref 36.0–46.0)
Hemoglobin: 10.6 g/dL — ABNORMAL LOW (ref 12.0–15.0)
Hemoglobin: 9.5 g/dL — ABNORMAL LOW (ref 12.0–15.0)
Hemoglobin: 9.7 g/dL — ABNORMAL LOW (ref 12.0–15.0)
MCH: 29.6 pg (ref 26.0–34.0)
MCH: 29.6 pg (ref 26.0–34.0)
MCH: 29.8 pg (ref 26.0–34.0)
MCHC: 33.7 g/dL (ref 30.0–36.0)
MCHC: 33.9 g/dL (ref 30.0–36.0)
MCHC: 33.9 g/dL (ref 30.0–36.0)
MCV: 87.3 fL (ref 78.0–100.0)
MCV: 87.6 fL (ref 78.0–100.0)
MCV: 88.1 fL (ref 78.0–100.0)
MCV: 88.6 fL (ref 78.0–100.0)
Platelets: 279 10*3/uL (ref 150–400)
RBC: 3.19 MIL/uL — ABNORMAL LOW (ref 3.87–5.11)
RBC: 3.26 MIL/uL — ABNORMAL LOW (ref 3.87–5.11)
RDW: 16.2 % — ABNORMAL HIGH (ref 11.5–15.5)
RDW: 17.2 % — ABNORMAL HIGH (ref 11.5–15.5)
RDW: 17.3 % — ABNORMAL HIGH (ref 11.5–15.5)
RDW: 17.5 % — ABNORMAL HIGH (ref 11.5–15.5)
WBC: 2.8 10*3/uL — ABNORMAL LOW (ref 4.0–10.5)
WBC: 3.1 10*3/uL — ABNORMAL LOW (ref 4.0–10.5)
WBC: 3.4 10*3/uL — ABNORMAL LOW (ref 4.0–10.5)
WBC: 4.3 10*3/uL (ref 4.0–10.5)

## 2010-05-18 LAB — CLOSTRIDIUM DIFFICILE EIA
C difficile Toxins A+B, EIA: NEGATIVE
C difficile Toxins A+B, EIA: NEGATIVE
C difficile Toxins A+B, EIA: NEGATIVE

## 2010-05-18 LAB — BASIC METABOLIC PANEL
BUN: 16 mg/dL (ref 6–23)
CO2: 24 mEq/L (ref 19–32)
Calcium: 6.8 mg/dL — ABNORMAL LOW (ref 8.4–10.5)
Calcium: 7.1 mg/dL — ABNORMAL LOW (ref 8.4–10.5)
Chloride: 110 mEq/L (ref 96–112)
Creatinine, Ser: 0.83 mg/dL (ref 0.4–1.2)
GFR calc non Af Amer: 32 mL/min — ABNORMAL LOW (ref >60)
Glucose, Bld: 143 mg/dL — ABNORMAL HIGH (ref 70–99)
Glucose, Bld: 178 mg/dL — ABNORMAL HIGH (ref 70–99)
Potassium: 3.1 mEq/L — ABNORMAL LOW (ref 3.5–5.1)
Sodium: 136 mEq/L (ref 135–145)
Sodium: 141 mEq/L (ref 135–145)

## 2010-05-18 LAB — FUNGUS CULTURE W SMEAR

## 2010-05-18 LAB — STOOL CULTURE

## 2010-05-18 LAB — AFB CULTURE WITH SMEAR (NOT AT ARMC): Acid Fast Smear: NONE SEEN

## 2010-05-18 LAB — DIFFERENTIAL
Basophils Absolute: 0 10*3/uL (ref 0.0–0.1)
Basophils Absolute: 0 10*3/uL (ref 0.0–0.1)
Basophils Relative: 0 % (ref 0–1)
Basophils Relative: 0 % (ref 0–1)
Basophils Relative: 0 % (ref 0–1)
Eosinophils Absolute: 0.3 10*3/uL (ref 0.0–0.7)
Eosinophils Relative: 5 % (ref 0–5)
Lymphocytes Relative: 21 % (ref 12–46)
Lymphs Abs: 0.3 10*3/uL — ABNORMAL LOW (ref 0.7–4.0)
Monocytes Relative: 3 % (ref 3–12)
Monocytes Relative: 5 % (ref 3–12)
Neutro Abs: 2.1 10*3/uL (ref 1.7–7.7)
Neutro Abs: 3 10*3/uL (ref 1.7–7.7)
Neutrophils Relative %: 71 % (ref 43–77)
Neutrophils Relative %: 75 % (ref 43–77)
Neutrophils Relative %: 85 % — ABNORMAL HIGH (ref 43–77)

## 2010-05-18 LAB — CORTISOL: Cortisol, Plasma: 55.9 ug/dL

## 2010-05-18 LAB — MRSA PCR SCREENING: MRSA by PCR: NEGATIVE

## 2010-05-18 LAB — VIRUS CULTURE

## 2010-05-18 LAB — PROTIME-INR
INR: 1.49 (ref 0.00–1.49)
Prothrombin Time: 17.6 seconds — ABNORMAL HIGH (ref 11.6–15.2)

## 2010-05-18 LAB — HEPATIC FUNCTION PANEL
ALT: 37 U/L — ABNORMAL HIGH (ref 0–35)
AST: 86 U/L — ABNORMAL HIGH (ref 0–37)
Albumin: 2.1 g/dL — ABNORMAL LOW (ref 3.5–5.2)
Indirect Bilirubin: 1.1 mg/dL — ABNORMAL HIGH (ref 0.3–0.9)

## 2010-05-18 LAB — URINE MICROSCOPIC-ADD ON

## 2010-05-18 LAB — CLOSTRIDIUM DIFFICILE BY PCR: Toxigenic C. Difficile by PCR: NOT DETECTED

## 2010-05-18 LAB — MAGNESIUM: Magnesium: 1.8 mg/dL (ref 1.5–2.5)

## 2010-05-18 LAB — ROCKY MTN SPOTTED FVR AB, IGM-BLOOD: RMSF IgM: 0.07 IV (ref 0.00–0.89)

## 2010-05-18 LAB — CULTURE, BLOOD (ROUTINE X 2): Culture: NO GROWTH

## 2010-05-18 LAB — PNEUMOCYSTIS JIROVECI SMEAR BY DFA: Pneumocystis jiroveci Ag: NOT DETECTED

## 2010-05-18 LAB — CREATININE, URINE, RANDOM
Creatinine, Urine: 115.35 mg/dL
Creatinine, Urine: 119.8 mg/dL

## 2010-05-18 LAB — ROCKY MTN SPOTTED FVR AB, IGG-BLOOD: RMSF IgG: 0.06 IV

## 2010-05-18 LAB — APTT: aPTT: 43 seconds — ABNORMAL HIGH (ref 24–37)

## 2010-05-18 LAB — PROCALCITONIN: Procalcitonin: 19.58 ng/mL

## 2010-05-18 LAB — BRAIN NATRIURETIC PEPTIDE: Pro B Natriuretic peptide (BNP): <30 pg/mL (ref 0.0–100.0)

## 2010-05-18 LAB — SODIUM, URINE, RANDOM: Sodium, Ur: <10 mEq/L

## 2010-06-01 ENCOUNTER — Encounter (HOSPITAL_BASED_OUTPATIENT_CLINIC_OR_DEPARTMENT_OTHER): Payer: BC Managed Care – PPO | Admitting: Oncology

## 2010-06-01 ENCOUNTER — Other Ambulatory Visit: Payer: Self-pay | Admitting: Oncology

## 2010-06-01 DIAGNOSIS — D638 Anemia in other chronic diseases classified elsewhere: Secondary | ICD-10-CM

## 2010-06-01 DIAGNOSIS — D709 Neutropenia, unspecified: Secondary | ICD-10-CM

## 2010-06-01 DIAGNOSIS — Z86718 Personal history of other venous thrombosis and embolism: Secondary | ICD-10-CM

## 2010-06-01 DIAGNOSIS — C9000 Multiple myeloma not having achieved remission: Secondary | ICD-10-CM

## 2010-06-01 LAB — CBC WITH DIFFERENTIAL/PLATELET
BASO%: 1.3 % (ref 0.0–2.0)
EOS%: 8.9 % — ABNORMAL HIGH (ref 0.0–7.0)
HGB: 12.8 g/dL (ref 11.6–15.9)
MCH: 29.2 pg (ref 25.1–34.0)
MCHC: 33.9 g/dL (ref 31.5–36.0)
MONO#: 0.4 10*3/uL (ref 0.1–0.9)
RDW: 15.2 % — ABNORMAL HIGH (ref 11.2–14.5)
WBC: 3.8 10*3/uL — ABNORMAL LOW (ref 3.9–10.3)
lymph#: 1.7 10*3/uL (ref 0.9–3.3)

## 2010-06-01 LAB — PREGNANCY, URINE: Preg Test, Ur: NEGATIVE

## 2010-06-14 LAB — BONE MARROW EXAM

## 2010-06-29 ENCOUNTER — Other Ambulatory Visit: Payer: Self-pay | Admitting: Oncology

## 2010-06-29 ENCOUNTER — Encounter (HOSPITAL_BASED_OUTPATIENT_CLINIC_OR_DEPARTMENT_OTHER): Payer: BC Managed Care – PPO | Admitting: Oncology

## 2010-06-29 DIAGNOSIS — C9000 Multiple myeloma not having achieved remission: Secondary | ICD-10-CM

## 2010-06-29 DIAGNOSIS — D709 Neutropenia, unspecified: Secondary | ICD-10-CM

## 2010-06-29 DIAGNOSIS — D638 Anemia in other chronic diseases classified elsewhere: Secondary | ICD-10-CM

## 2010-06-29 DIAGNOSIS — D649 Anemia, unspecified: Secondary | ICD-10-CM

## 2010-06-29 DIAGNOSIS — Z86718 Personal history of other venous thrombosis and embolism: Secondary | ICD-10-CM

## 2010-06-29 LAB — CBC WITH DIFFERENTIAL/PLATELET
BASO%: 1.6 % (ref 0.0–2.0)
EOS%: 8.1 % — ABNORMAL HIGH (ref 0.0–7.0)
MCH: 28.5 pg (ref 25.1–34.0)
MCHC: 33.1 g/dL (ref 31.5–36.0)
RBC: 4.81 10*6/uL (ref 3.70–5.45)
RDW: 15.2 % — ABNORMAL HIGH (ref 11.2–14.5)
lymph#: 1.8 10*3/uL (ref 0.9–3.3)

## 2010-06-29 LAB — PREGNANCY, URINE: Preg Test, Ur: NEGATIVE

## 2010-07-03 LAB — BASIC METABOLIC PANEL
BUN: 11 mg/dL (ref 6–23)
Potassium: 3.8 mEq/L (ref 3.5–5.3)
Sodium: 140 mEq/L (ref 135–145)

## 2010-07-03 LAB — KAPPA/LAMBDA LIGHT CHAINS
Kappa free light chain: 2.78 mg/dL — ABNORMAL HIGH (ref 0.33–1.94)
Kappa:Lambda Ratio: 0.72 (ref 0.26–1.65)
Lambda Free Lght Chn: 3.88 mg/dL — ABNORMAL HIGH (ref 0.57–2.63)

## 2010-07-03 LAB — PROTEIN ELECTROPHORESIS, SERUM
Albumin ELP: 49.9 % — ABNORMAL LOW (ref 55.8–66.1)
Beta 2: 7.4 % — ABNORMAL HIGH (ref 3.2–6.5)
Total Protein, Serum Electrophoresis: 7.6 g/dL (ref 6.0–8.3)

## 2010-07-18 NOTE — Consult Note (Signed)
NAMEJASPER, RUMINSKI               ACCOUNT NO.:  [id number]   MEDICAL RECORD NO.:  [id number]          PATIENT TYPE:  OUT   LOCATION:  XRAY                         FACILITY:  [facility]   PHYSICIAN:  Sanjeev K. Deveshwar, M.D.DATE OF BIRTH:  05-24-1950   DATE OF CONSULTATION:  12/19/2007  DATE OF DISCHARGE:                                 CONSULTATION   CHIEF COMPLAINT:  Status post kyphoplasties at the T9 and T11 levels  performed on December 05, 2007.   BRIEF HISTORY:  This is a very pleasant 60 year old female, who was  referred to Dr. Corliss Skains through the courtesy of Dr. Eula Listen  for evaluation of multiple compression fractures.  The patient does have  a diagnosis of multiple myeloma and is currently undergoing  chemotherapy.  An MRI had been attempted, although the patient was  unable to lie flat secondary to her pain.  A CT scan was performed on  October 29, 2007, that showed innumerable lytic lesions with multiple  pathologic compression fractures at T3, T7, T8, T9, and T11.  The  patient was experiencing severe pain and spasm which significantly  limited her mobility.  She was having great difficulty even walking  short distances.  She was unable to care for herself.  Her husband had  to do much of her self-care.  The patient had been wearing a back brace  with little relief.   The patient did meet with Dr. Corliss Skains on November 27, 2007, treatment  options were discussed and on December 05, 2007, the patient underwent a  kyphoplasty at the T9 and T11 levels.  Anesthesia was standing by in  case the patient would require general anesthesia.  However, she was  able to have the procedures performed under conscious sedation.  The  patient returns today accompanied by her husband to be seen in followup.   PAST MEDICAL HISTORY:  Significant for anxiety and claustrophobia.  She  has also had some tachycardia and labile blood pressure.  She was  diagnosed as having multiple  myeloma and is currently receiving  chemotherapy.  Prior to this most recent event, the patient had been  very healthy.   SURGICAL HISTORY:  The patient has had no major surgeries.   ALLERGIES:  The patient developed a rash with RELAFEN.   CURRENT MEDICATIONS:  Coumadin as DVT prophylaxis.  She is on Decadron  40 mg each week and Revlimid and Velcade as her chemotherapy.  She is  not taking any pain medications at this time.   SOCIAL HISTORY:  The patient is married.  She and her husband have three  children.  The patient and her husband live in [place].  She has  never been a smoker.  She does not use alcohol to any significant  degree.  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  She has not been to work since November 24, 2007, due to her pain.   FAMILY HISTORY:  The patient's mother is alive and well at age 38.  Her  father died at age 36  from cardiovascular disease.  He was a heavy  smoker.   IMPRESSION AND PLAN:  As noted, the patient returns today to be seen in  followup after kyphoplasties at the T9 and T11 levels performed on  December 05, 2007, by Dr. Corliss Skains.  She is currently being treated for  multiple myeloma, which was felt to be the cause of the fractures.  The  patient was very pleased to report that she has had no further pain or  spasm.  She is now able to ambulate with a walker and she is thinking  about resuming physical therapy.  As noted, she has not been back to  work since November 24, 2007.  She would like to return to work in  early November 2009.  We did fill out a leave of absence form to allow  her to return to work on January 05, 2008, per her request.   As noted, the patient has at least three other fractures that were not  treated, although her pain has significantly improved.  Dr. Corliss Skains  did caution her to not do anything strenuous as the other fractures may  become worse and might require  treatment in the future.  He did  recommend that the patient use a walker or a cane for as long as she  will tolerate.  She may resume physical therapy, although she should not  do anything strenuous and she should avoid extreme bending or twisting  of her spine.  Dr. Corliss Skains also felt that she might benefit from water  aerobics.  It was also recommended that she participate in some of these  support groups offered to the cancer center.   Overall, the patient appears to be doing well at this time.  Dr.  Corliss Skains did review the previous CT scan images with the patient and  her husband.  All of their questions were answered.   Greater than 25 minutes was spent on this followup visit.  Further  followup would be on a p.r.n. basis.      Delton See, P.A.    ______________________________  Grandville Silos. Corliss Skains, M.D.    DR/MEDQ  D:  12/19/2007  T:  12/20/2007  Job:  [id number]   cc:   Merlene Laughter. Renae Gloss, M.D.  Leighton Roach. Truett Perna, M.D.  Otho Darner, M.D.

## 2010-07-18 NOTE — Consult Note (Signed)
Carrie Huber, Carrie Huber               ACCOUNT NO.:  [id number]   MEDICAL RECORD NO.:  [id number]          PATIENT TYPE:  OUT   LOCATION:  XRAY                         FACILITY:  [facility]   PHYSICIAN:  Sanjeev K. Deveshwar, M.D.DATE OF BIRTH:  May 19, 1950   DATE OF CONSULTATION:  11/27/2007  DATE OF DISCHARGE:                                 CONSULTATION   CHIEF COMPLAINT:  Multiple compression fractures.   HISTORY OF PRESENT ILLNESS:  This is a very pleasant 60 year old female  who was referred to Dr. Corliss Skains through the courtesy of Dr. Eula Listen for evaluation of compression fractures.  The patient developed  back pain in June of this year after participating in a yoga class.  At  first, she thought she had just drained her back.  She was evaluated by  Dr. Eula Listen and treated for her back pain.  An MRI was  attempted, however, the patient was unable to lie flat for the MRI.  A  CT scan was performed of the thoracic spine on October 29, 2007 at [facility] which showed innumerable lytic lesions with multiple pathologic  compression fractures including T3, T7, T8, T9, and T11.  The patient  has had significant problem with back pain and spasm since that time.  She has been very sedentary due to the discomfort.  She has been  participating in physical therapy.  She is also wearing a back brace and  taking muscle relaxants; however, there has been no significant  improvement in her symptoms.  She presents today accompanied by her  husband to discuss further treatment options.   Past medical history is significant for anxiety and claustrophobia.  She  reports a recent tachycardia.  She has labile blood pressure.  She has  been diagnosed as having multiple myeloma and is receiving chemotherapy.  Prior to this most recent event, she had been very healthy.   SURGICAL HISTORY:  The patient has had no major surgeries.   ALLERGIES:  She developed a rash with RELAFEN.   Current medications include Flexeril.  She is on Coumadin as DVT  prophylaxis.  She is on Decadron 40 mg each week.  She is on Revlimid  and Velcade.   SOCIAL HISTORY:  The patient is married.  She has 3 children.  The  patient and her husband live in [place].  She has never been a  smoker.  She does not use any significant amount of alcohol.  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   FAMILY HISTORY:  Her mother is alive and well at age 24.  Her father  died at age 55 from cardiovascular disease, he was also a heavy smoker.   IMPRESSION AND PLAN:  As noted, the patient presents today for further  evaluation of her compression fractures and to discuss treatment  options.  Compression fractures were described in detail as well as  vertebroplasties, kyphoplasties, and a coblation procedure.  The patient  and her husband were also shown a video describing compression fractures  and the kyphoplasty procedure.  The procedures were described in detail  along with risks and benefits.   Dr. Corliss Skains reviewed the results of the CT scan with the patient and  her husband.  He recommended that treatment be performed under general  anesthesia as the procedure would no doubt be very uncomfortable for the  patient as she probably does not have severe osteoporosis.  Dr.  Corliss Skains recommended treating T9 and T11 first as these seem to be the  most severe fractures.  He felt that this might lead to significant  improvement where she would not have to have further treatment.  If she  continued to have symptoms after treatment at these 2 levels, further  intervention could be considered.   As noted, the patient is on Coumadin.  The Coumadin would need to be  stopped at least 5 days prior to the procedure.  All of the patient's  and her husband's questions were answered.  Greater than 1 hour was  spent on this consult.  We plan to schedule her procedure as  soon as  anesthesia is available, and as noted, she will need to come off the  Coumadin.      Delton See, P.A.    ______________________________  Grandville Silos. Corliss Skains, M.D.    DR/MEDQ  D:  11/28/2007  T:  11/28/2007  Job:  [id number]   cc:   Jillyn Hidden B. Truett Perna, M.D.  Otho Darner, M.D.  Merlene Laughter. Renae Gloss, M.D.

## 2010-07-27 ENCOUNTER — Other Ambulatory Visit: Payer: Self-pay | Admitting: Oncology

## 2010-07-27 ENCOUNTER — Encounter (HOSPITAL_BASED_OUTPATIENT_CLINIC_OR_DEPARTMENT_OTHER): Payer: BC Managed Care – PPO | Admitting: Oncology

## 2010-07-27 DIAGNOSIS — D709 Neutropenia, unspecified: Secondary | ICD-10-CM

## 2010-07-27 DIAGNOSIS — Z86718 Personal history of other venous thrombosis and embolism: Secondary | ICD-10-CM

## 2010-07-27 DIAGNOSIS — D638 Anemia in other chronic diseases classified elsewhere: Secondary | ICD-10-CM

## 2010-07-27 DIAGNOSIS — C9000 Multiple myeloma not having achieved remission: Secondary | ICD-10-CM

## 2010-07-27 LAB — CBC WITH DIFFERENTIAL/PLATELET
BASO%: 1.1 % (ref 0.0–2.0)
Basophils Absolute: 0 10*3/uL (ref 0.0–0.1)
EOS%: 6.3 % (ref 0.0–7.0)
HGB: 13.4 g/dL (ref 11.6–15.9)
MCH: 28.6 pg (ref 25.1–34.0)
MCHC: 33.3 g/dL (ref 31.5–36.0)
RDW: 15 % — ABNORMAL HIGH (ref 11.2–14.5)
lymph#: 2 10*3/uL (ref 0.9–3.3)

## 2010-07-27 LAB — PREGNANCY, URINE: Preg Test, Ur: NEGATIVE

## 2010-08-24 ENCOUNTER — Other Ambulatory Visit: Payer: Self-pay | Admitting: Oncology

## 2010-08-24 ENCOUNTER — Encounter (HOSPITAL_BASED_OUTPATIENT_CLINIC_OR_DEPARTMENT_OTHER): Payer: BC Managed Care – PPO | Admitting: Oncology

## 2010-08-24 DIAGNOSIS — Z86718 Personal history of other venous thrombosis and embolism: Secondary | ICD-10-CM

## 2010-08-24 DIAGNOSIS — D709 Neutropenia, unspecified: Secondary | ICD-10-CM

## 2010-08-24 DIAGNOSIS — C9000 Multiple myeloma not having achieved remission: Secondary | ICD-10-CM

## 2010-08-24 DIAGNOSIS — D638 Anemia in other chronic diseases classified elsewhere: Secondary | ICD-10-CM

## 2010-08-24 LAB — CBC WITH DIFFERENTIAL/PLATELET
BASO%: 1.7 % (ref 0.0–2.0)
MCHC: 33.1 g/dL (ref 31.5–36.0)
MONO#: 0.3 10*3/uL (ref 0.1–0.9)
RBC: 4.8 10*6/uL (ref 3.70–5.45)
RDW: 14.4 % (ref 11.2–14.5)
WBC: 3.5 10*3/uL — ABNORMAL LOW (ref 3.9–10.3)
lymph#: 1.7 10*3/uL (ref 0.9–3.3)
nRBC: 0 % (ref 0–0)

## 2010-09-21 ENCOUNTER — Encounter (HOSPITAL_BASED_OUTPATIENT_CLINIC_OR_DEPARTMENT_OTHER): Payer: BC Managed Care – PPO | Admitting: Oncology

## 2010-09-21 ENCOUNTER — Other Ambulatory Visit: Payer: Self-pay | Admitting: Oncology

## 2010-09-21 DIAGNOSIS — Z86718 Personal history of other venous thrombosis and embolism: Secondary | ICD-10-CM

## 2010-09-21 DIAGNOSIS — C9000 Multiple myeloma not having achieved remission: Secondary | ICD-10-CM

## 2010-09-21 DIAGNOSIS — Z7901 Long term (current) use of anticoagulants: Secondary | ICD-10-CM

## 2010-09-21 DIAGNOSIS — D709 Neutropenia, unspecified: Secondary | ICD-10-CM

## 2010-09-21 LAB — CBC WITH DIFFERENTIAL/PLATELET
BASO%: 1.6 % (ref 0.0–2.0)
Eosinophils Absolute: 0.2 10*3/uL (ref 0.0–0.5)
MONO#: 0.5 10*3/uL (ref 0.1–0.9)
MONO%: 10.9 % (ref 0.0–14.0)
NEUT#: 1.3 10*3/uL — ABNORMAL LOW (ref 1.5–6.5)
RBC: 4.91 10*6/uL (ref 3.70–5.45)
RDW: 14.7 % — ABNORMAL HIGH (ref 11.2–14.5)
WBC: 4.4 10*3/uL (ref 3.9–10.3)
nRBC: 0 % (ref 0–0)

## 2010-09-21 LAB — PREGNANCY, URINE: Preg Test, Ur: NEGATIVE

## 2010-11-23 ENCOUNTER — Other Ambulatory Visit: Payer: Self-pay | Admitting: Oncology

## 2010-11-23 ENCOUNTER — Encounter (HOSPITAL_BASED_OUTPATIENT_CLINIC_OR_DEPARTMENT_OTHER): Payer: BC Managed Care – PPO | Admitting: Oncology

## 2010-11-23 DIAGNOSIS — D709 Neutropenia, unspecified: Secondary | ICD-10-CM

## 2010-11-23 DIAGNOSIS — D649 Anemia, unspecified: Secondary | ICD-10-CM

## 2010-11-23 DIAGNOSIS — D638 Anemia in other chronic diseases classified elsewhere: Secondary | ICD-10-CM

## 2010-11-23 DIAGNOSIS — C9000 Multiple myeloma not having achieved remission: Secondary | ICD-10-CM

## 2010-11-23 LAB — CBC WITH DIFFERENTIAL/PLATELET
BASO%: 0.9 % (ref 0.0–2.0)
Eosinophils Absolute: 0.1 10*3/uL (ref 0.0–0.5)
MCHC: 33.5 g/dL (ref 31.5–36.0)
MONO#: 0.5 10*3/uL (ref 0.1–0.9)
NEUT#: 1.9 10*3/uL (ref 1.5–6.5)
Platelets: 220 10*3/uL (ref 145–400)
RBC: 4.45 10*6/uL (ref 3.70–5.45)
RDW: 14.8 % — ABNORMAL HIGH (ref 11.2–14.5)
WBC: 4.2 10*3/uL (ref 3.9–10.3)
lymph#: 1.7 10*3/uL (ref 0.9–3.3)
nRBC: 0 % (ref 0–0)

## 2010-12-05 LAB — CBC
MCV: 87.4
RBC: 3.69 — ABNORMAL LOW
WBC: 6.5

## 2010-12-05 LAB — DIFFERENTIAL
Eosinophils Absolute: 0
Lymphocytes Relative: 14
Lymphs Abs: 0.9
Monocytes Relative: 9
Neutrophils Relative %: 77

## 2010-12-05 LAB — APTT: aPTT: 24

## 2010-12-05 LAB — BASIC METABOLIC PANEL
Chloride: 104
GFR calc Af Amer: >60
GFR calc non Af Amer: >60
Potassium: 3.5
Sodium: 132 — ABNORMAL LOW

## 2010-12-05 LAB — PROTIME-INR: INR: 1.1

## 2010-12-06 LAB — CROSSMATCH
ABO/RH(D): A POS
Antibody Screen: NEGATIVE

## 2010-12-06 LAB — ABO/RH: ABO/RH(D): A POS

## 2010-12-07 ENCOUNTER — Ambulatory Visit (HOSPITAL_COMMUNITY)
Admission: RE | Admit: 2010-12-07 | Discharge: 2010-12-07 | Disposition: A | Discharge status: Home / Self Care | Payer: BC Managed Care – PPO | Source: Ambulatory Visit | Attending: Oncology | Admitting: Oncology

## 2010-12-07 DIAGNOSIS — M5030 Other cervical disc degeneration, unspecified cervical region: Secondary | ICD-10-CM | POA: Insufficient documentation

## 2010-12-07 DIAGNOSIS — C9000 Multiple myeloma not having achieved remission: Secondary | ICD-10-CM

## 2010-12-07 DIAGNOSIS — M948X9 Other specified disorders of cartilage, unspecified sites: Secondary | ICD-10-CM | POA: Insufficient documentation

## 2010-12-14 ENCOUNTER — Other Ambulatory Visit: Payer: Self-pay | Admitting: Oncology

## 2010-12-14 ENCOUNTER — Encounter (HOSPITAL_BASED_OUTPATIENT_CLINIC_OR_DEPARTMENT_OTHER): Payer: BC Managed Care – PPO | Admitting: Oncology

## 2010-12-14 DIAGNOSIS — C9000 Multiple myeloma not having achieved remission: Secondary | ICD-10-CM

## 2010-12-14 DIAGNOSIS — D638 Anemia in other chronic diseases classified elsewhere: Secondary | ICD-10-CM

## 2010-12-14 DIAGNOSIS — D649 Anemia, unspecified: Secondary | ICD-10-CM

## 2010-12-14 DIAGNOSIS — D709 Neutropenia, unspecified: Secondary | ICD-10-CM

## 2010-12-14 LAB — COMPREHENSIVE METABOLIC PANEL
AST: 22 U/L (ref 0–37)
Albumin: 3.7 g/dL (ref 3.5–5.2)
Alkaline Phosphatase: 86 U/L (ref 39–117)
BUN: 10 mg/dL (ref 6–23)
Glucose, Bld: 85 mg/dL (ref 70–99)
Potassium: 3.6 mEq/L (ref 3.5–5.3)
Sodium: 137 mEq/L (ref 135–145)
Total Bilirubin: 0.5 mg/dL (ref 0.3–1.2)
Total Protein: 8.3 g/dL (ref 6.0–8.3)

## 2010-12-14 LAB — CBC WITH DIFFERENTIAL/PLATELET
BASO%: 0.8 % (ref 0.0–2.0)
EOS%: 3.8 % (ref 0.0–7.0)
MCH: 28.9 pg (ref 25.1–34.0)
MCHC: 33.3 g/dL (ref 31.5–36.0)
MCV: 86.8 fL (ref 79.5–101.0)
MONO%: 10.8 % (ref 0.0–14.0)
RBC: 4.78 10*6/uL (ref 3.70–5.45)
RDW: 14.7 % — ABNORMAL HIGH (ref 11.2–14.5)
lymph#: 1.6 10*3/uL (ref 0.9–3.3)

## 2010-12-18 LAB — PROTEIN ELECTROPHORESIS, SERUM
Albumin ELP: 49.2 % — ABNORMAL LOW (ref 55.8–66.1)
Alpha-1-Globulin: 4.9 % (ref 2.9–4.9)
Beta 2: 7.5 % — ABNORMAL HIGH (ref 3.2–6.5)
Beta Globulin: 5.9 % (ref 4.7–7.2)
Gamma Globulin: 19.1 % — ABNORMAL HIGH (ref 11.1–18.8)

## 2010-12-18 LAB — KAPPA/LAMBDA LIGHT CHAINS: Kappa:Lambda Ratio: 1.08 (ref 0.26–1.65)

## 2011-01-08 ENCOUNTER — Telehealth: Payer: Self-pay | Admitting: *Deleted

## 2011-01-08 NOTE — Telephone Encounter (Signed)
RECEIVED A FAX FROM [facility] CONCERNING A PRESCRIPTION REFILL REQUEST FOR REVLIMID. THIS REQUEST WAS GIVEN TO DR.SHERRILL'S NURSE, SUSAN COWARD,RN. 

## 2011-01-09 ENCOUNTER — Encounter: Payer: Self-pay | Admitting: *Deleted

## 2011-01-09 NOTE — Progress Notes (Signed)
RECEIVED A FAX FROM [facility] CONCERNING A CONFIRMATION OF FACSIMILE RECEIPT. THIS NOTICE WAS GIVEN TO DR.SHERRILL'S NURSE, SUSAN COWARD,RN. 

## 2011-01-13 ENCOUNTER — Other Ambulatory Visit: Payer: Self-pay | Admitting: *Deleted

## 2011-01-13 ENCOUNTER — Encounter: Payer: Self-pay | Admitting: *Deleted

## 2011-01-13 MED ORDER — LENALIDOMIDE 5 MG PO CAPS: 5.0000 mg | ORAL_CAPSULE | Freq: Every day | ORAL | Status: DC

## 2011-01-22 ENCOUNTER — Other Ambulatory Visit: Payer: Self-pay | Admitting: Oncology

## 2011-01-22 ENCOUNTER — Other Ambulatory Visit (HOSPITAL_BASED_OUTPATIENT_CLINIC_OR_DEPARTMENT_OTHER): Payer: BC Managed Care – PPO | Admitting: Lab

## 2011-01-22 DIAGNOSIS — D709 Neutropenia, unspecified: Secondary | ICD-10-CM

## 2011-01-22 DIAGNOSIS — D649 Anemia, unspecified: Secondary | ICD-10-CM

## 2011-01-22 DIAGNOSIS — C9000 Multiple myeloma not having achieved remission: Secondary | ICD-10-CM

## 2011-01-22 DIAGNOSIS — D638 Anemia in other chronic diseases classified elsewhere: Secondary | ICD-10-CM

## 2011-01-22 LAB — CBC WITH DIFFERENTIAL/PLATELET
Basophils Absolute: 0 10*3/uL (ref 0.0–0.1)
Eosinophils Absolute: 0.2 10*3/uL (ref 0.0–0.5)
HCT: 40.5 % (ref 34.8–46.6)
HGB: 13.5 g/dL (ref 11.6–15.9)
LYMPH%: 44.6 % (ref 14.0–49.7)
MONO#: 0.4 10*3/uL (ref 0.1–0.9)
NEUT%: 41.1 % (ref 38.4–76.8)
Platelets: 231 10*3/uL (ref 145–400)
WBC: 4.2 10*3/uL (ref 3.9–10.3)
lymph#: 1.9 10*3/uL (ref 0.9–3.3)

## 2011-01-29 ENCOUNTER — Telehealth: Payer: Self-pay | Admitting: Oncology

## 2011-01-29 NOTE — Telephone Encounter (Signed)
Pt called  To schedule her jan2013 appts

## 2011-02-05 ENCOUNTER — Other Ambulatory Visit: Payer: Self-pay | Admitting: *Deleted

## 2011-02-05 NOTE — Telephone Encounter (Signed)
RECEIVED A FAX FROM [facility] CONCERNING A PRESCRIPTION REFILL REQUEST FOR REVLIMID. THIS REQUEST WAS GIVEN TO DR.SHERRILL'S NURSE, SUSAN COWARD,RN. 

## 2011-02-06 ENCOUNTER — Other Ambulatory Visit: Payer: Self-pay | Admitting: *Deleted

## 2011-02-06 MED ORDER — LENALIDOMIDE 5 MG PO CAPS
5.0000 mg | ORAL_CAPSULE | Freq: Every day | ORAL | Status: DC
Start: 2011-02-06 — End: 2011-03-12

## 2011-03-05 ENCOUNTER — Encounter: Payer: Self-pay | Admitting: *Deleted

## 2011-03-05 NOTE — Progress Notes (Signed)
RX refill request from [facility] for Revlimid given to Dr. Truett Perna for signature.

## 2011-03-12 ENCOUNTER — Other Ambulatory Visit: Payer: Self-pay | Admitting: *Deleted

## 2011-03-12 MED ORDER — LENALIDOMIDE 5 MG PO CAPS: 5.0000 mg | ORAL_CAPSULE | Freq: Every day | ORAL | Status: DC

## 2011-03-26 ENCOUNTER — Other Ambulatory Visit: Payer: Self-pay | Admitting: *Deleted

## 2011-03-26 DIAGNOSIS — C9000 Multiple myeloma not having achieved remission: Secondary | ICD-10-CM | POA: Insufficient documentation

## 2011-03-27 ENCOUNTER — Ambulatory Visit (HOSPITAL_BASED_OUTPATIENT_CLINIC_OR_DEPARTMENT_OTHER): Payer: BC Managed Care – PPO

## 2011-03-27 ENCOUNTER — Ambulatory Visit (HOSPITAL_BASED_OUTPATIENT_CLINIC_OR_DEPARTMENT_OTHER): Payer: BC Managed Care – PPO | Admitting: Oncology

## 2011-03-27 ENCOUNTER — Other Ambulatory Visit: Payer: Self-pay | Admitting: Oncology

## 2011-03-27 ENCOUNTER — Other Ambulatory Visit: Payer: BC Managed Care – PPO | Admitting: Lab

## 2011-03-27 DIAGNOSIS — C9000 Multiple myeloma not having achieved remission: Secondary | ICD-10-CM

## 2011-03-27 DIAGNOSIS — D709 Neutropenia, unspecified: Secondary | ICD-10-CM

## 2011-03-27 LAB — CBC WITH DIFFERENTIAL/PLATELET
Basophils Absolute: 0.1 10*3/uL (ref 0.0–0.1)
Eosinophils Absolute: 0.2 10*3/uL (ref 0.0–0.5)
HCT: 40.7 % (ref 34.8–46.6)
HGB: 13.6 g/dL (ref 11.6–15.9)
LYMPH%: 52.4 % — ABNORMAL HIGH (ref 14.0–49.7)
MCH: 28.9 pg (ref 25.1–34.0)
MCV: 86.6 fL (ref 79.5–101.0)
MONO%: 8.3 % (ref 0.0–14.0)
NEUT#: 1.5 10*3/uL (ref 1.5–6.5)
NEUT%: 33 % — ABNORMAL LOW (ref 38.4–76.8)
Platelets: 224 10*3/uL (ref 145–400)
RDW: 14.9 % — ABNORMAL HIGH (ref 11.2–14.5)

## 2011-03-27 MED ORDER — SODIUM CHLORIDE 0.9 % IV SOLN
4.0000 mg | Freq: Once | INTRAVENOUS | Status: AC
  Administered 2011-03-27: 4 mg via INTRAVENOUS
  Filled 2011-03-27: qty 5

## 2011-03-27 MED ORDER — SODIUM CHLORIDE 0.9 % IV SOLN
Freq: Once | INTRAVENOUS | Status: AC
  Administered 2011-03-27: 11:00:00 via INTRAVENOUS

## 2011-03-27 NOTE — Patient Instructions (Signed)
Pt aware of future appts, no complaints, ambulatory.  SLJ 

## 2011-03-27 NOTE — Progress Notes (Signed)
Declines flu vaccine.

## 2011-03-27 NOTE — Progress Notes (Signed)
OFFICE PROGRESS NOTE   INTERVAL HISTORY:   She returns as scheduled. She continues maintenance Revlimid. She reports tolerating the Revlimid well. She denies neuropathy symptoms. She reports no recent infection. She denies back pain.  Objective:  Vital signs in last 24 hours:  Blood pressure 146/90, pulse 94, temperature 98 F (36.7 C), temperature source Oral, height 5' 5.5" (1.664 m), weight 208 lb 8 oz (94.575 kg).    HEENT: No thrush Resp: Lungs clear bilaterally Cardio: Regular rate and rhythm GI: No hepatosplenomegaly Vascular: No leg edema    Lab Results:  Lab Results  Component Value Date   WBC 4.6 03/27/2011   HGB 13.6 03/27/2011   HCT 40.7 03/27/2011   MCV 86.6 03/27/2011   PLT 224 03/27/2011   ANC 1.5   Medications: I have reviewed the patient's current medications.  Assessment/Plan: 1. Multiple myeloma, status post 7 cycles of Revlimid/Decadron and Velcade with marked clinical and laboratory improvement.  She is status post melphalan conditioning, followed by an autologous stem cell infusion 09/10/2008.  She began maintenance Revlimid 01/04/2009.  The serum M-spike was not detected on an SPEP in October of 2012.  She continues maintenance Revlimid.  2. Neutropenia while on Revlimid at a dose of 10 mg daily.  The Revlimid dose was reduced to 5 mg in September 2011 due to persistent neutropenia and a hospital admission with pneumonia/sepsis.  3. Admission 11/03/2009 with left lung pneumonia and sepsis syndrome.  No specific organism was cultured during the hospital admission.  She completed outpatient Levaquin therapy.  4. 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  5. Thoracic spine compression fracture, status post a kyphoplasty 12/05/2007. A metastatic bone survey was unchanged in October of 2012. 6. Tiny pulmonary embolism on a CT 11/20/2007, now maintained off of Coumadin.  7. Chronic tachycardia on  repeat office visits at the [facility].    Disposition:  She appears stable. She will continue maintenance Revlimid and every three-month Zometa. We will check a serum protein electrophoresis and serum free light chain analysis today. She is scheduled to see Dr. Vicente Serene in February.  She will return for an office visit and Zometa in 3 months. Ms. Kettlewell declined an influenza vaccine today.   Lucile Shutters, MD  03/27/2011  10:15 AM

## 2011-04-04 ENCOUNTER — Encounter: Payer: Self-pay | Admitting: *Deleted

## 2011-04-04 ENCOUNTER — Other Ambulatory Visit: Payer: Self-pay

## 2011-04-04 MED ORDER — LENALIDOMIDE 5 MG PO CAPS: 5.0000 mg | ORAL_CAPSULE | Freq: Every day | ORAL | Status: DC

## 2011-04-04 NOTE — Progress Notes (Signed)
[facility] faxed Revlimid refill request.  Request to MF for review.

## 2011-04-04 NOTE — Progress Notes (Signed)
Received fax from [facility] stating that pt's Revlimid script was received and delivery arrangements will be made pending insurance verification. dph

## 2011-05-02 ENCOUNTER — Encounter: Payer: Self-pay | Admitting: *Deleted

## 2011-05-02 NOTE — Progress Notes (Signed)
[facility] faxed Revlimid refill request.  Request to MD for review.  

## 2011-05-03 ENCOUNTER — Other Ambulatory Visit: Payer: Self-pay

## 2011-05-03 MED ORDER — LENALIDOMIDE 5 MG PO CAPS
5.0000 mg | ORAL_CAPSULE | Freq: Every day | ORAL | Status: DC
Start: 2011-05-03 — End: 2011-05-29

## 2011-05-29 ENCOUNTER — Other Ambulatory Visit: Payer: Self-pay

## 2011-05-29 MED ORDER — LENALIDOMIDE 5 MG PO CAPS: 5.0000 mg | ORAL_CAPSULE | Freq: Every day | ORAL | Status: DC

## 2011-06-18 ENCOUNTER — Other Ambulatory Visit: Payer: Self-pay | Admitting: Oncology

## 2011-06-19 ENCOUNTER — Ambulatory Visit: Payer: BC Managed Care – PPO | Admitting: Nurse Practitioner

## 2011-06-19 ENCOUNTER — Other Ambulatory Visit (HOSPITAL_BASED_OUTPATIENT_CLINIC_OR_DEPARTMENT_OTHER): Payer: BC Managed Care – PPO

## 2011-06-19 ENCOUNTER — Ambulatory Visit (HOSPITAL_BASED_OUTPATIENT_CLINIC_OR_DEPARTMENT_OTHER): Payer: BC Managed Care – PPO

## 2011-06-19 ENCOUNTER — Telehealth: Payer: Self-pay | Admitting: Oncology

## 2011-06-19 DIAGNOSIS — C9000 Multiple myeloma not having achieved remission: Secondary | ICD-10-CM

## 2011-06-19 LAB — CBC WITH DIFFERENTIAL/PLATELET
BASO%: 0.8 % (ref 0.0–2.0)
Eosinophils Absolute: 0.2 10*3/uL (ref 0.0–0.5)
HCT: 41.6 % (ref 34.8–46.6)
LYMPH%: 46.8 % (ref 14.0–49.7)
MCHC: 33.2 g/dL (ref 31.5–36.0)
MCV: 87.2 fL (ref 79.5–101.0)
MONO#: 0.3 10*3/uL (ref 0.1–0.9)
MONO%: 8.4 % (ref 0.0–14.0)
NEUT%: 39.5 % (ref 38.4–76.8)
Platelets: 254 10*3/uL (ref 145–400)
WBC: 3.8 10*3/uL — ABNORMAL LOW (ref 3.9–10.3)

## 2011-06-19 LAB — BASIC METABOLIC PANEL
CO2: 25 mEq/L (ref 19–32)
Calcium: 9.7 mg/dL (ref 8.4–10.5)
Creatinine, Ser: 0.95 mg/dL (ref 0.50–1.10)
Glucose, Bld: 92 mg/dL (ref 70–99)

## 2011-06-19 MED ORDER — ZOLEDRONIC ACID 4 MG/100ML IV SOLN
4.0000 mg | Freq: Once | INTRAVENOUS | Status: AC
  Administered 2011-06-19: 4 mg via INTRAVENOUS
  Filled 2011-06-19: qty 100

## 2011-06-19 MED ORDER — SODIUM CHLORIDE 0.9 % IV SOLN
Freq: Once | INTRAVENOUS | Status: AC
  Administered 2011-06-19: 11:00:00 via INTRAVENOUS

## 2011-06-19 NOTE — Progress Notes (Signed)
OFFICE PROGRESS NOTE  Interval history:  Carrie Huber returns as scheduled. She continues Revlimid 5 mg daily. She feels well. No interim illnesses or infections. She denies pain. No fevers or sweats. No cough or shortness of breath. She has a good appetite.   Objective: Blood pressure 144/92, pulse 84, temperature 98.2 F (36.8 C), temperature source Oral, height 5' 5.5" (1.664 m), weight 208 lb 14.4 oz (94.756 kg).  Oropharynx is without thrush or ulceration. No palpable cervical, supraclavicular or axillary lymph nodes. Lungs are clear. No wheezes or rales. Regular cardiac rhythm. Abdomen is soft and nontender. No organomegaly. Extremities are without edema.  Lab Results: Lab Results  Component Value Date   WBC 3.8* 06/19/2011   HGB 13.8 06/19/2011   HCT 41.6 06/19/2011   MCV 87.2 06/19/2011   PLT 254 06/19/2011    Chemistry:    Chemistry      Component Value Date/Time   NA 137 12/14/2010 1147   K 3.6 12/14/2010 1147   CL 103 12/14/2010 1147   CO2 26 12/14/2010 1147   BUN 10 12/14/2010 1147   CREATININE 0.86 12/14/2010 1147   GLU 143* 11/04/2007 0918      Component Value Date/Time   CALCIUM 9.7 12/14/2010 1147   ALKPHOS 86 12/14/2010 1147   AST 22 12/14/2010 1147   ALT 22 12/14/2010 1147   BILITOT 0.5 12/14/2010 1147       Studies/Results: No results found.  Medications: I have reviewed the patient's current medications.  Assessment/Plan:  1. Multiple myeloma, status post 7 cycles of Revlimid/Decadron and Velcade with marked clinical and laboratory improvement. She is status post melphalan conditioning, followed by an autologous stem cell infusion 09/10/2008. She began maintenance Revlimid 01/04/2009. The serum M-spike was not detected on an SPEP in October of 2012. She continues maintenance Revlimid.  2. Neutropenia while on Revlimid at a dose of 10 mg daily. The Revlimid dose was reduced to 5 mg in September 2011 due to persistent neutropenia and a hospital admission  with pneumonia/sepsis.  3. Admission 11/03/2009 with left lung pneumonia and sepsis syndrome. No specific organism was cultured during the hospital admission. She completed outpatient Levaquin therapy.  4. 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  5. Thoracic spine compression fracture, status post a kyphoplasty 12/05/2007. A metastatic bone survey was unchanged in October of 2012. 6. Tiny pulmonary embolism on a CT 11/20/2007, now maintained off of Coumadin.  7. Chronic tachycardia on repeat office visits at the [facility].   Disposition-[name] appears stable. She will continue maintenance Revlimid and every 3 month Zometa. We are obtaining a serum protein electrophoresis and serum free light chain analysis today. We will contact her with the results. She will return for an office visit and Zometa in 3 months. She will contact the office in the interim with any problems.  Plan reviewed with Dr. Truett Huber.  Carrie Huber ANP/GNP-BC

## 2011-06-19 NOTE — Telephone Encounter (Signed)
Gv pt appt for july2013.  pt stated that she will not be able to schedule appt with Dr. Felicity Coyer until this summer. I provided phone number to pt to call and scheduled

## 2011-06-21 LAB — PROTEIN ELECTROPHORESIS, SERUM
Albumin ELP: 48.7 % — ABNORMAL LOW (ref 55.8–66.1)
Alpha-1-Globulin: 4.8 % (ref 2.9–4.9)
Beta 2: 7.7 % — ABNORMAL HIGH (ref 3.2–6.5)
Gamma Globulin: 19.5 % — ABNORMAL HIGH (ref 11.1–18.8)

## 2011-06-21 LAB — KAPPA/LAMBDA LIGHT CHAINS
Kappa free light chain: 3.06 mg/dL — ABNORMAL HIGH (ref 0.33–1.94)
Kappa:Lambda Ratio: 0.87 (ref 0.26–1.65)

## 2011-06-25 ENCOUNTER — Telehealth: Payer: Self-pay | Admitting: *Deleted

## 2011-06-25 NOTE — Telephone Encounter (Signed)
Patient notified via VM that No  M-spike and light chains are stable.

## 2011-06-25 NOTE — Telephone Encounter (Signed)
Message copied by Wandalee Ferdinand on Mon Jun 25, 2011  6:35 PM ------      Message from: Thornton Papas B      Created: Thu Jun 21, 2011  8:58 PM       Please call patient, no m-spike, light chains stable

## 2011-06-27 ENCOUNTER — Other Ambulatory Visit: Payer: Self-pay | Admitting: *Deleted

## 2011-06-27 MED ORDER — LENALIDOMIDE 5 MG PO CAPS: 5.0000 mg | ORAL_CAPSULE | Freq: Every day | ORAL | Status: DC

## 2011-06-29 ENCOUNTER — Encounter: Payer: Self-pay | Admitting: *Deleted

## 2011-06-29 NOTE — Progress Notes (Signed)
RECEIVED A FAX FROM [facility] CONCERNING A CONFIRMATION OF PRESCRIPTION SHIPMENT FOR REVLIMID. 

## 2011-07-25 ENCOUNTER — Telehealth: Payer: Self-pay | Admitting: *Deleted

## 2011-07-25 NOTE — Telephone Encounter (Signed)
[facility] faxed request for revlimid refill.  Request to Md for review.

## 2011-07-26 ENCOUNTER — Other Ambulatory Visit: Payer: Self-pay | Admitting: *Deleted

## 2011-07-26 ENCOUNTER — Encounter: Payer: Self-pay | Admitting: *Deleted

## 2011-07-26 MED ORDER — LENALIDOMIDE 5 MG PO CAPS: 5.0000 mg | ORAL_CAPSULE | Freq: Every day | ORAL | Status: DC

## 2011-07-26 NOTE — Progress Notes (Signed)
Fax confirmation from [facility] that referral has been received and in insurance verification process.

## 2011-07-27 ENCOUNTER — Encounter: Payer: Self-pay | Admitting: *Deleted

## 2011-07-27 NOTE — Progress Notes (Signed)
Fax confirmation that Revlimid was shipped on 07/26/11

## 2011-08-22 ENCOUNTER — Telehealth: Payer: Self-pay | Admitting: *Deleted

## 2011-08-22 ENCOUNTER — Other Ambulatory Visit: Payer: Self-pay | Admitting: *Deleted

## 2011-08-22 DIAGNOSIS — C9000 Multiple myeloma not having achieved remission: Secondary | ICD-10-CM

## 2011-08-22 MED ORDER — LENALIDOMIDE 5 MG PO CAPS: 5.0000 mg | ORAL_CAPSULE | Freq: Every day | ORAL | Status: DC

## 2011-08-22 NOTE — Telephone Encounter (Signed)
[facility] faxed Revlimid refill request.  This facsimile given to MD to review.

## 2011-08-24 ENCOUNTER — Encounter: Payer: Self-pay | Admitting: *Deleted

## 2011-08-24 NOTE — Progress Notes (Signed)
RECEIVED A FAX FROM [facility] CONCERNING A CONFIRMATION OF PRESCRIPTION SHIPMENT FOR REVLIMID. 

## 2011-09-18 ENCOUNTER — Other Ambulatory Visit (HOSPITAL_BASED_OUTPATIENT_CLINIC_OR_DEPARTMENT_OTHER): Payer: BC Managed Care – PPO

## 2011-09-18 ENCOUNTER — Telehealth: Payer: Self-pay | Admitting: Oncology

## 2011-09-18 ENCOUNTER — Ambulatory Visit (HOSPITAL_BASED_OUTPATIENT_CLINIC_OR_DEPARTMENT_OTHER): Payer: BC Managed Care – PPO | Admitting: Oncology

## 2011-09-18 ENCOUNTER — Ambulatory Visit (HOSPITAL_BASED_OUTPATIENT_CLINIC_OR_DEPARTMENT_OTHER): Payer: BC Managed Care – PPO

## 2011-09-18 VITALS — BP 144/86 | HR 86 | Temp 98.3°F | Ht 65.5 in | Wt 211.6 lb

## 2011-09-18 DIAGNOSIS — C9000 Multiple myeloma not having achieved remission: Secondary | ICD-10-CM

## 2011-09-18 DIAGNOSIS — D709 Neutropenia, unspecified: Secondary | ICD-10-CM

## 2011-09-18 DIAGNOSIS — Z86718 Personal history of other venous thrombosis and embolism: Secondary | ICD-10-CM

## 2011-09-18 LAB — CBC WITH DIFFERENTIAL/PLATELET
Eosinophils Absolute: 0.2 10*3/uL (ref 0.0–0.5)
HCT: 39.9 % (ref 34.8–46.6)
LYMPH%: 44.9 % (ref 14.0–49.7)
MCHC: 33.6 g/dL (ref 31.5–36.0)
MCV: 86.9 fL (ref 79.5–101.0)
MONO%: 14.2 % — ABNORMAL HIGH (ref 0.0–14.0)
NEUT%: 33.9 % — ABNORMAL LOW (ref 38.4–76.8)
Platelets: 202 10*3/uL (ref 145–400)
RBC: 4.59 10*6/uL (ref 3.70–5.45)
nRBC: 0 % (ref 0–0)

## 2011-09-18 LAB — BASIC METABOLIC PANEL
BUN: 11 mg/dL (ref 6–23)
CO2: 23 mEq/L (ref 19–32)
Chloride: 103 mEq/L (ref 96–112)
Glucose, Bld: 98 mg/dL (ref 70–99)
Potassium: 3.4 mEq/L — ABNORMAL LOW (ref 3.5–5.3)
Sodium: 137 mEq/L (ref 135–145)

## 2011-09-18 MED ORDER — ZOLEDRONIC ACID 4 MG/100ML IV SOLN
4.0000 mg | Freq: Once | INTRAVENOUS | Status: AC
  Administered 2011-09-18: 4 mg via INTRAVENOUS
  Filled 2011-09-18: qty 100

## 2011-09-18 MED ORDER — SODIUM CHLORIDE 0.9 % IV SOLN
Freq: Once | INTRAVENOUS | Status: AC
  Administered 2011-09-18: 10:00:00 via INTRAVENOUS

## 2011-09-18 NOTE — Progress Notes (Signed)
   [facility]    OFFICE PROGRESS NOTE   INTERVAL HISTORY:   She returns as scheduled. She continues Revlimid. No recent infection. No pain. No complaint. She reports receiving an influenza vaccine at [facility] in February of this year. She tolerated the vaccine well.  Objective:  Vital signs in last 24 hours:  Blood pressure 144/86, pulse 86, temperature 98.3 F (36.8 C), temperature source Oral, height 5' 5.5" (1.664 m), weight 211 lb 9.6 oz (95.981 kg).    HEENT: No thrush or ulcers Resp: Lungs clear bilaterally Cardio: Regular rate and rhythm GI: No hepatosplenic Vascular: The left lower leg is larger than the right side, no edema or erythema   Lab Results:  Lab Results  Component Value Date   WBC 4.0 09/18/2011   HGB 13.4 09/18/2011   HCT 39.9 09/18/2011   MCV 86.9 09/18/2011   PLT 202 09/18/2011   ANC 1.4 Kappa free light chains 3.06 on 06/19/2011 Serum protein electrophoresis-no M spike detected on 06/19/2011 Medications: I have reviewed the patient's current medications.  Assessment/Plan: 1. Multiple myeloma, status post 7 cycles of Revlimid/Decadron and Velcade with marked clinical and laboratory improvement. She is status post melphalan conditioning, followed by an autologous stem cell infusion 09/10/2008. She began maintenance Revlimid 01/04/2009. The serum M-spike was not detected on an SPEP in April of 2013. She continues maintenance Revlimid.  2. Neutropenia while on Revlimid at a dose of 10 mg daily. The Revlimid dose was reduced to 5 mg in September 2011 due to persistent neutropenia and a hospital admission with pneumonia/sepsis. Mild neutropenia persists. 3. Admission 11/03/2009 with left lung pneumonia and sepsis syndrome. No specific organism was cultured during the hospital admission. She completed outpatient Levaquin therapy.  4. 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  5. Thoracic  spine compression fracture, status post a kyphoplasty 12/05/2007. A metastatic bone survey was unchanged in October of 2012. 6. Tiny pulmonary embolism on a CT 11/20/2007, now maintained off of Coumadin.  7. Chronic tachycardia on repeat office visits at the [facility]    Disposition:  She appears stable. She will continue maintenance Revlimid and every 3 month Zometa. She will return for an office visit in 3 months. She will receive an influenza vaccine at the next office visit.   Thornton Papas, MD  09/18/2011  3:04 PM

## 2011-09-18 NOTE — Telephone Encounter (Signed)
appts made and printed for pt aom °

## 2011-09-18 NOTE — Patient Instructions (Signed)
[facility] Discharge Instructions for Patients Receiving Chemotherapy  Today you received the following chemotherapy agents: Zometa  To help prevent nausea and vomiting after your treatment, we encourage you to take your nausea medication. Take it as often as prescribed for the next 48-72 hours.   If you develop nausea and vomiting that is not controlled by your nausea medication, call the clinic. If it is after clinic hours your family physician or the after hours number for the clinic or go to the Emergency Department.   BELOW ARE SYMPTOMS THAT SHOULD BE REPORTED IMMEDIATELY:  *FEVER GREATER THAN 100.5 F  *CHILLS WITH OR WITHOUT FEVER  NAUSEA AND VOMITING THAT IS NOT CONTROLLED WITH YOUR NAUSEA MEDICATION  *UNUSUAL SHORTNESS OF BREATH  *UNUSUAL BRUISING OR BLEEDING  TENDERNESS IN MOUTH AND THROAT WITH OR WITHOUT PRESENCE OF ULCERS  *URINARY PROBLEMS  *BOWEL PROBLEMS  UNUSUAL RASH Items with * indicate a potential emergency and should be followed up as soon as possible.  Feel free to call the clinic you have any questions or concerns. The clinic phone number is [phone].   I have been informed and understand all the instructions given to me. I know to contact the clinic, my physician, or go to the Emergency Department if any problems should occur. I do not have any questions at this time, but understand that I may call the clinic during office hours   should I have any questions or need assistance in obtaining follow up care.    __________________________________________  _____________  __________ Signature of Patient or Authorized Representative            Date                   Time    __________________________________________ Nurse's Signature    

## 2011-09-19 ENCOUNTER — Other Ambulatory Visit: Payer: Self-pay | Admitting: *Deleted

## 2011-09-19 ENCOUNTER — Telehealth: Payer: Self-pay | Admitting: *Deleted

## 2011-09-19 DIAGNOSIS — C9000 Multiple myeloma not having achieved remission: Secondary | ICD-10-CM

## 2011-09-19 MED ORDER — LENALIDOMIDE 5 MG PO CAPS: 5.0000 mg | ORAL_CAPSULE | Freq: Every day | ORAL | Status: DC

## 2011-09-19 NOTE — Telephone Encounter (Signed)
[facility] faxed Revlimid refill request.  Request to MD for review.  

## 2011-09-20 ENCOUNTER — Telehealth: Payer: Self-pay | Admitting: *Deleted

## 2011-09-20 ENCOUNTER — Encounter: Payer: Self-pay | Admitting: *Deleted

## 2011-09-20 LAB — PROTEIN ELECTROPHORESIS, SERUM
Alpha-1-Globulin: 4.6 % (ref 2.9–4.9)
Alpha-2-Globulin: 12.4 % — ABNORMAL HIGH (ref 7.1–11.8)
Beta 2: 8.3 % — ABNORMAL HIGH (ref 3.2–6.5)
Gamma Globulin: 19.4 % — ABNORMAL HIGH (ref 11.1–18.8)

## 2011-09-20 LAB — KAPPA/LAMBDA LIGHT CHAINS: Kappa free light chain: 2.17 mg/dL — ABNORMAL HIGH (ref 0.33–1.94)

## 2011-09-20 NOTE — Telephone Encounter (Signed)
Called pt with light chains results. She voiced understanding.

## 2011-09-20 NOTE — Progress Notes (Signed)
RECEIVED A FAX FROM [facility] CONCERNING A CONFIRMATION OF FACSIMILE RECEIPT FOR PT.'S REFERRAL. 

## 2011-09-20 NOTE — Telephone Encounter (Signed)
Message copied by Caleb Popp on Thu Sep 20, 2011 12:53 PM ------      Message from: Thornton Papas B      Created: Wed Sep 19, 2011  7:52 PM       Please call patient, light chains are stable, f/u as scheduled

## 2011-09-21 NOTE — Progress Notes (Signed)
RECEIVED A FAX FROM [facility] CONCERNING A CONFIRMATION OF PRESCRIPTION SHIPMENT FOR REVLIMID. 

## 2011-09-26 ENCOUNTER — Encounter: Payer: Self-pay | Admitting: Oncology

## 2011-09-26 NOTE — Progress Notes (Signed)
[facility] @ [id number] approved revlimid 5mg  from 09/26/11-09/25/12.

## 2011-10-17 ENCOUNTER — Other Ambulatory Visit: Payer: Self-pay

## 2011-10-17 DIAGNOSIS — C9000 Multiple myeloma not having achieved remission: Secondary | ICD-10-CM

## 2011-10-17 MED ORDER — LENALIDOMIDE 5 MG PO CAPS: 5.0000 mg | ORAL_CAPSULE | Freq: Every day | ORAL | Status: DC

## 2011-10-24 NOTE — Telephone Encounter (Signed)
[facility] faxed confirmation of prescription shipment.  Revlimid was shipped 10-23-2011 with next business day delivery.

## 2011-11-15 ENCOUNTER — Other Ambulatory Visit: Payer: Self-pay | Admitting: *Deleted

## 2011-11-15 DIAGNOSIS — C9000 Multiple myeloma not having achieved remission: Secondary | ICD-10-CM

## 2011-11-15 MED ORDER — LENALIDOMIDE 5 MG PO CAPS: 5.0000 mg | ORAL_CAPSULE | Freq: Every day | ORAL | Status: DC

## 2011-11-15 NOTE — Addendum Note (Signed)
Addended by: Arvilla Meres on: 11/15/2011 05:28 PM   Modules accepted: Orders

## 2011-11-15 NOTE — Telephone Encounter (Signed)
THIS REFILL REQUEST FOR REVLIMID WAS GIVEN TO DR.SHERRILL'S NURSE, AMY HORTON,RN. 

## 2011-11-20 NOTE — Telephone Encounter (Signed)
RECEIVED A FAX FROM [facility] CONCERNING A CONFIRMATION OF PRESCRIPTION SHIPMENT FOR REVLIMID ON 11/19/11.

## 2011-12-12 ENCOUNTER — Telehealth: Payer: Self-pay | Admitting: *Deleted

## 2011-12-12 NOTE — Telephone Encounter (Signed)
Refill request received from [facility] for revlimid 5mg  capsules.  Request to MD for review.

## 2011-12-14 ENCOUNTER — Other Ambulatory Visit: Payer: Self-pay | Admitting: *Deleted

## 2011-12-14 DIAGNOSIS — C9000 Multiple myeloma not having achieved remission: Secondary | ICD-10-CM

## 2011-12-14 MED ORDER — LENALIDOMIDE 5 MG PO CAPS: 5.0000 mg | ORAL_CAPSULE | Freq: Every day | ORAL | Status: DC

## 2011-12-17 ENCOUNTER — Other Ambulatory Visit: Payer: Self-pay | Admitting: *Deleted

## 2011-12-17 NOTE — Telephone Encounter (Addendum)
RECEIVED A CONFIRMATION OF FACSIMILE RECEIPT FOR PT.'S REFERRAL.

## 2011-12-19 NOTE — Telephone Encounter (Signed)
[facility] faxed confirmation of prescription shipment.  Revlimid was shipped 12-18-2011 with next business day delivery.

## 2011-12-20 ENCOUNTER — Ambulatory Visit (HOSPITAL_BASED_OUTPATIENT_CLINIC_OR_DEPARTMENT_OTHER): Payer: BC Managed Care – PPO

## 2011-12-20 ENCOUNTER — Ambulatory Visit (HOSPITAL_BASED_OUTPATIENT_CLINIC_OR_DEPARTMENT_OTHER): Payer: BC Managed Care – PPO | Admitting: Nurse Practitioner

## 2011-12-20 ENCOUNTER — Other Ambulatory Visit (HOSPITAL_BASED_OUTPATIENT_CLINIC_OR_DEPARTMENT_OTHER): Payer: BC Managed Care – PPO

## 2011-12-20 ENCOUNTER — Telehealth: Payer: Self-pay | Admitting: Oncology

## 2011-12-20 VITALS — BP 155/99 | HR 85 | Temp 97.7°F | Resp 20 | Ht 65.5 in | Wt 204.8 lb

## 2011-12-20 DIAGNOSIS — Z23 Encounter for immunization: Secondary | ICD-10-CM

## 2011-12-20 DIAGNOSIS — C9000 Multiple myeloma not having achieved remission: Secondary | ICD-10-CM

## 2011-12-20 DIAGNOSIS — R000 Tachycardia, unspecified: Secondary | ICD-10-CM

## 2011-12-20 DIAGNOSIS — Z86711 Personal history of pulmonary embolism: Secondary | ICD-10-CM

## 2011-12-20 DIAGNOSIS — D702 Other drug-induced agranulocytosis: Secondary | ICD-10-CM

## 2011-12-20 LAB — CBC WITH DIFFERENTIAL/PLATELET
EOS%: 5.1 % (ref 0.0–7.0)
MCH: 29.5 pg (ref 25.1–34.0)
MCV: 89.2 fL (ref 79.5–101.0)
MONO%: 11.4 % (ref 0.0–14.0)
NEUT#: 1.2 10*3/uL — ABNORMAL LOW (ref 1.5–6.5)
RBC: 4.65 10*6/uL (ref 3.70–5.45)
RDW: 14.8 % — ABNORMAL HIGH (ref 11.2–14.5)
lymph#: 1.7 10*3/uL (ref 0.9–3.3)
nRBC: 0 % (ref 0–0)

## 2011-12-20 LAB — BASIC METABOLIC PANEL (CC13)
BUN: 11 mg/dL (ref 7.0–26.0)
Calcium: 9.5 mg/dL (ref 8.4–10.4)
Chloride: 108 mEq/L — ABNORMAL HIGH (ref 98–107)
Creatinine: 0.9 mg/dL (ref 0.6–1.1)

## 2011-12-20 MED ORDER — INFLUENZA VIRUS VACC SPLIT PF IM SUSP
0.5000 mL | Freq: Once | INTRAMUSCULAR | Status: AC
Start: 2011-12-20 — End: 2011-12-20
  Administered 2011-12-20: 0.5 mL via INTRAMUSCULAR
  Filled 2011-12-20: qty 0.5

## 2011-12-20 MED ORDER — ZOLEDRONIC ACID 4 MG/5ML IV CONC
4.0000 mg | Freq: Once | INTRAVENOUS | Status: AC
  Administered 2011-12-20: 4 mg via INTRAVENOUS
  Filled 2011-12-20: qty 5

## 2011-12-20 MED ORDER — SODIUM CHLORIDE 0.9 % IV SOLN
Freq: Once | INTRAVENOUS | Status: AC
  Administered 2011-12-20: 10:00:00 via INTRAVENOUS

## 2011-12-20 NOTE — Telephone Encounter (Signed)
gv pt appt schedule for January 2014.  °

## 2011-12-20 NOTE — Patient Instructions (Addendum)
Influenza Virus Trivalent Vaccine injection (Fluzone/FluLaval/Fluvirin/Agriflu) What is this medicine? INFLUENZA VIRUS VACCINE (in floo EN zuh VAHY ruhs vak SEEN) helps to reduce the risk of getting influenza also known as the flu. The vaccine only helps protect you against some strains of the flu. This medicine may be used for other purposes; ask your health care provider or pharmacist if you have questions. What should I tell my health care provider before I take this medicine? They need to know if you have any of these conditions: -bleeding disorder like hemophilia -fever or infection -Guillain-Barre syndrome or other neurological problems -immune system problems -infection with the human immunodeficiency virus (HIV) or AIDS -low blood platelet counts -multiple sclerosis -an unusual or allergic reaction to influenza virus vaccine, eggs, chicken proteins, thimerosal, other medicines, foods, dyes or preservatives -pregnant or trying to get pregnant -breast-feeding How should I use this medicine? This vaccine is for injection into a muscle or under the skin. It is given by a health care professional. A copy of Vaccine Information Statements will be given before each vaccination. Read this sheet carefully each time. The sheet may change frequently. Talk to your pediatrician regarding the use of this medicine in children. Special care may be needed. While some brands of this drug may be prescribed for children as young as 6 months of age for selected conditions, precautions do apply. Overdosage: If you think you have taken too much of this medicine contact a poison control center or emergency room at once. NOTE: This medicine is only for you. Do not share this medicine with others. What if I miss a dose? This does not apply. What may interact with this medicine? -chemotherapy or radiation therapy -medicines that lower your immune system like etanercept, anakinra, infliximab, and  adalimumab -medicines that treat or prevent blood clots like warfarin -phenytoin -steroid medicines like prednisone or cortisone -theophylline -vaccines This list may not describe all possible interactions. Give your health care provider a list of all the medicines, herbs, non-prescription drugs, or dietary supplements you use. Also tell them if you smoke, drink alcohol, or use illegal drugs. Some items may interact with your medicine. What should I watch for while using this medicine? Report any side effects that do not go away within 3 days to your doctor or health care professional. Call your health care provider if any unusual symptoms occur within 6 weeks of receiving this vaccine. You may still catch the flu, but the illness is not usually as bad. You cannot get the flu from the vaccine. The vaccine will not protect against colds or other illnesses that may cause fever. The vaccine is needed every year. What side effects may I notice from receiving this medicine? Side effects that you should report to your doctor or health care professional as soon as possible: -allergic reactions like skin rash, itching or hives, swelling of the face, lips, or tongue Side effects that usually do not require medical attention (report to your doctor or health care professional if they continue or are bothersome): -fever -headache -muscle aches and pains -pain, tenderness, redness, or swelling at the injection site -tiredness This list may not describe all possible side effects. Call your doctor for medical advice about side effects. You may report side effects to FDA at 1-800-FDA-1088. Where should I keep my medicine? The vaccine will be given by a health care professional in a clinic, pharmacy, doctor's office, or other health care setting. You will not be given vaccine doses to store   at home. NOTE: This sheet is a summary. It may not cover all possible information. If you have questions about this  medicine, talk to your doctor, pharmacist, or health care provider.  2012, Elsevier/Gold Standard. (07/20/2009 10:47:37 AM)Zoledronic Acid injection (Hypercalcemia, Oncology) What is this medicine? ZOLEDRONIC ACID (ZOE le dron ik AS id) lowers the amount of calcium loss from bone. It is used to treat too much calcium in your blood from cancer. It is also used to prevent complications of cancer that has spread to the bone. This medicine may be used for other purposes; ask your health care provider or pharmacist if you have questions. What should I tell my health care provider before I take this medicine? They need to know if you have any of these conditions: -aspirin-sensitive asthma -dental disease -kidney disease -an unusual or allergic reaction to zoledronic acid, other medicines, foods, dyes, or preservatives -pregnant or trying to get pregnant -breast-feeding How should I use this medicine? This medicine is for infusion into a vein. It is given by a health care professional in a hospital or clinic setting. Talk to your pediatrician regarding the use of this medicine in children. Special care may be needed. Overdosage: If you think you have taken too much of this medicine contact a poison control center or emergency room at once. NOTE: This medicine is only for you. Do not share this medicine with others. What if I miss a dose? It is important not to miss your dose. Call your doctor or health care professional if you are unable to keep an appointment. What may interact with this medicine? -certain antibiotics given by injection -NSAIDs, medicines for pain and inflammation, like ibuprofen or naproxen -some diuretics like bumetanide, furosemide -teriparatide -thalidomide This list may not describe all possible interactions. Give your health care provider a list of all the medicines, herbs, non-prescription drugs, or dietary supplements you use. Also tell them if you smoke, drink alcohol,  or use illegal drugs. Some items may interact with your medicine. What should I watch for while using this medicine? Visit your doctor or health care professional for regular checkups. It may be some time before you see the benefit from this medicine. Do not stop taking your medicine unless your doctor tells you to. Your doctor may order blood tests or other tests to see how you are doing. Women should inform their doctor if they wish to become pregnant or think they might be pregnant. There is a potential for serious side effects to an unborn child. Talk to your health care professional or pharmacist for more information. You should make sure that you get enough calcium and vitamin D while you are taking this medicine. Discuss the foods you eat and the vitamins you take with your health care professional. Some people who take this medicine have severe bone, joint, and/or muscle pain. This medicine may also increase your risk for a broken thigh bone. Tell your doctor right away if you have pain in your upper leg or groin. Tell your doctor if you have any pain that does not go away or that gets worse. What side effects may I notice from receiving this medicine? Side effects that you should report to your doctor or health care professional as soon as possible: -allergic reactions like skin rash, itching or hives, swelling of the face, lips, or tongue -anxiety, confusion, or depression -breathing problems -changes in vision -feeling faint or lightheaded, falls -jaw burning, cramping, pain -muscle cramps, stiffness, or weakness -  trouble passing urine or change in the amount of urine Side effects that usually do not require medical attention (report to your doctor or health care professional if they continue or are bothersome): -bone, joint, or muscle pain -fever -hair loss -irritation at site where injected -loss of appetite -nausea, vomiting -stomach upset -tired This list may not describe all  possible side effects. Call your doctor for medical advice about side effects. You may report side effects to FDA at 1-800-FDA-1088. Where should I keep my medicine? This drug is given in a hospital or clinic and will not be stored at home. NOTE: This sheet is a summary. It may not cover all possible information. If you have questions about this medicine, talk to your doctor, pharmacist, or health care provider.  2012, Elsevier/Gold Standard. (08/18/2010 9:06:58 AM)

## 2011-12-20 NOTE — Progress Notes (Signed)
OFFICE PROGRESS NOTE  Interval history:  Carrie Huber returns as scheduled. She continues Revlimid. She feels well. No interim illnesses or infections. She has a good appetite. She reports intentional weight loss by changing her diet and exercising. She denies pain. No fever, cough or shortness of breath. No bowel or bladder problems.   Objective: Blood pressure 155/99, pulse 85, temperature 97.7 F (36.5 C), temperature source Oral, resp. rate 20, height 5' 5.5" (1.664 m), weight 204 lb 12.8 oz (92.897 kg).  Oropharynx is without thrush or ulceration. Lungs are clear. No wheezes or rales. Regular cardiac rhythm. Abdomen is soft and nontender. No organomegaly. Extremities are without edema. Calves are soft and nontender.  Lab Results: Lab Results  Component Value Date   WBC 3.5* 12/20/2011   HGB 13.7 12/20/2011   HCT 41.5 12/20/2011   MCV 89.2 12/20/2011   PLT 214 12/20/2011    Chemistry:    Chemistry      Component Value Date/Time   NA 137 09/18/2011 0813   K 3.4* 09/18/2011 0813   CL 103 09/18/2011 0813   CO2 23 09/18/2011 0813   BUN 11 09/18/2011 0813   CREATININE 0.97 09/18/2011 0813   GLU 143* 11/04/2007 0918      Component Value Date/Time   CALCIUM 9.2 09/18/2011 0813   ALKPHOS 86 12/14/2010 1147   AST 22 12/14/2010 1147   ALT 22 12/14/2010 1147   BILITOT 0.5 12/14/2010 1147     Labs 09/18/2011-serum protein electrophoresis with no serum M spike detected. Kappa free light chains 2.17  Studies/Results: No results found.  Medications: I have reviewed the patient's current medications.  Assessment/Plan:  1. Multiple myeloma, status post 7 cycles of Revlimid/Decadron and Velcade with marked clinical and laboratory improvement. She is status post melphalan conditioning, followed by an autologous stem cell infusion 09/10/2008. She began maintenance Revlimid 01/04/2009. The serum M-spike was not detected on an SPEP in July of 2013. She continues maintenance Revlimid.   2. Neutropenia while on Revlimid at a dose of 10 mg daily. The Revlimid dose was reduced to 5 mg in September 2011 due to persistent neutropenia and a hospital admission with pneumonia/sepsis. Mild neutropenia persists. 3. Admission 11/03/2009 with left lung pneumonia and sepsis syndrome. No specific organism was cultured during the hospital admission. She completed outpatient Levaquin therapy.  4. 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  5. Thoracic spine compression fracture, status post a kyphoplasty 12/05/2007. A metastatic bone survey was unchanged in October of 2012. 6. Tiny pulmonary embolism on a CT 11/20/2007, now maintained off of Coumadin.  7. Chronic tachycardia on repeat office visits at the [facility].   Disposition-Carrie Huber appears stable. She will continue maintenance Revlimid and every 3 month Zometa. We will followup on the serum protein electrophoresis and free light chain analysis from today. She will return for a followup visit in 3 months. She will contact the office prior to her next visit with any problems.  We are administering the influenza vaccine today.  Lonna Cobb ANP/GNP-BC

## 2011-12-24 LAB — PROTEIN ELECTROPHORESIS, SERUM
Albumin ELP: 51.4 % — ABNORMAL LOW (ref 55.8–66.1)
Total Protein, Serum Electrophoresis: 7.7 g/dL (ref 6.0–8.3)

## 2011-12-24 LAB — KAPPA/LAMBDA LIGHT CHAINS
Kappa free light chain: 4.06 mg/dL — ABNORMAL HIGH (ref 0.33–1.94)
Lambda Free Lght Chn: 3.8 mg/dL — ABNORMAL HIGH (ref 0.57–2.63)

## 2012-01-09 ENCOUNTER — Other Ambulatory Visit: Payer: Self-pay | Admitting: *Deleted

## 2012-01-09 DIAGNOSIS — C9000 Multiple myeloma not having achieved remission: Secondary | ICD-10-CM

## 2012-01-09 MED ORDER — LENALIDOMIDE 5 MG PO CAPS: 5.0000 mg | ORAL_CAPSULE | Freq: Every day | ORAL | Status: DC

## 2012-01-09 NOTE — Telephone Encounter (Signed)
[facility] faxed confirmation of facsimile receipt for this referral.  [facility] will verify insurance and contact patient for delivery.

## 2012-01-15 NOTE — Telephone Encounter (Signed)
RECEIVED A FAX FROM [facility] CONCERNING A CONFIRMATION OF PRESCRIPTION SHIPMENT FOR REVLIMID ON 01/14/12. 

## 2012-02-06 ENCOUNTER — Telehealth: Payer: Self-pay | Admitting: *Deleted

## 2012-02-06 NOTE — Telephone Encounter (Signed)
[facility] faxed refill request for Revlimid.  Request to MD for review.

## 2012-02-07 ENCOUNTER — Other Ambulatory Visit: Payer: Self-pay | Admitting: *Deleted

## 2012-02-07 DIAGNOSIS — C9000 Multiple myeloma not having achieved remission: Secondary | ICD-10-CM

## 2012-02-07 MED ORDER — LENALIDOMIDE 5 MG PO CAPS: 5.0000 mg | ORAL_CAPSULE | Freq: Every day | ORAL | Status: DC

## 2012-02-07 NOTE — Telephone Encounter (Signed)
[facility] faxed confirmation of facsimile receipt for revlimid prescription.  Will verify insurance coverage and make delivery arrangements with patient.  

## 2012-02-13 NOTE — Telephone Encounter (Signed)
Rec'd fax that Revlimid shipped on 02/12/12 for next day delivery.

## 2012-02-29 ENCOUNTER — Other Ambulatory Visit: Payer: Self-pay | Admitting: *Deleted

## 2012-02-29 NOTE — Telephone Encounter (Signed)
THIS REFILL REQUEST FOR REVLIMID WAS GIVEN TO DR.SHERRILL'S NURSE, SUSAN COWARD,RN. 

## 2012-03-04 ENCOUNTER — Other Ambulatory Visit: Payer: Self-pay | Admitting: *Deleted

## 2012-03-04 DIAGNOSIS — C9000 Multiple myeloma not having achieved remission: Secondary | ICD-10-CM

## 2012-03-04 MED ORDER — LENALIDOMIDE 5 MG PO CAPS: 5.0000 mg | ORAL_CAPSULE | Freq: Every day | ORAL | Status: DC

## 2012-03-06 ENCOUNTER — Encounter: Payer: Self-pay | Admitting: *Deleted

## 2012-03-06 NOTE — Progress Notes (Signed)
Faxed notification from [employer] that she has been enrolled in [employer], occupational for 2014. [employer] will provide co-pay assist to reduce co-pay to $25 per prescription for Revlimid. Maximum benefit 2014= $10,000 Must maintain private insurance to continue to receive the assistance. Forwarded form to managed care department.

## 2012-03-11 NOTE — Progress Notes (Signed)
RECEIVED A FAX FROM [facility] CONCERNING A CONFIRMATION OF PRESCRIPTION SHIPMENT FOR REVLIMID ON 03/10/12.

## 2012-03-13 ENCOUNTER — Ambulatory Visit (HOSPITAL_BASED_OUTPATIENT_CLINIC_OR_DEPARTMENT_OTHER): Payer: BC Managed Care – PPO | Admitting: Oncology

## 2012-03-13 ENCOUNTER — Telehealth: Payer: Self-pay | Admitting: Oncology

## 2012-03-13 ENCOUNTER — Other Ambulatory Visit (HOSPITAL_BASED_OUTPATIENT_CLINIC_OR_DEPARTMENT_OTHER): Payer: BC Managed Care – PPO

## 2012-03-13 ENCOUNTER — Ambulatory Visit (HOSPITAL_BASED_OUTPATIENT_CLINIC_OR_DEPARTMENT_OTHER): Payer: BC Managed Care – PPO

## 2012-03-13 VITALS — BP 154/94 | HR 82 | Temp 97.2°F | Resp 20 | Ht 65.5 in | Wt 202.1 lb

## 2012-03-13 DIAGNOSIS — D649 Anemia, unspecified: Secondary | ICD-10-CM

## 2012-03-13 DIAGNOSIS — C9000 Multiple myeloma not having achieved remission: Secondary | ICD-10-CM

## 2012-03-13 DIAGNOSIS — D702 Other drug-induced agranulocytosis: Secondary | ICD-10-CM

## 2012-03-13 DIAGNOSIS — I2699 Other pulmonary embolism without acute cor pulmonale: Secondary | ICD-10-CM

## 2012-03-13 LAB — CBC WITH DIFFERENTIAL/PLATELET
Eosinophils Absolute: 0.2 10*3/uL (ref 0.0–0.5)
HCT: 41.3 % (ref 34.8–46.6)
HGB: 13.5 g/dL (ref 11.6–15.9)
LYMPH%: 45.7 % (ref 14.0–49.7)
MONO#: 0.4 10*3/uL (ref 0.1–0.9)
NEUT#: 1.3 10*3/uL — ABNORMAL LOW (ref 1.5–6.5)
NEUT%: 34.7 % — ABNORMAL LOW (ref 38.4–76.8)
Platelets: 224 10*3/uL (ref 145–400)
WBC: 3.6 10*3/uL — ABNORMAL LOW (ref 3.9–10.3)

## 2012-03-13 LAB — COMPREHENSIVE METABOLIC PANEL (CC13)
ALT: 8 U/L (ref 0–55)
AST: 13 U/L (ref 5–34)
Albumin: 3.2 g/dL — ABNORMAL LOW (ref 3.5–5.0)
Alkaline Phosphatase: 72 U/L (ref 40–150)
BUN: 14 mg/dL (ref 7.0–26.0)
Calcium: 9.1 mg/dL (ref 8.4–10.4)
Chloride: 107 mEq/L (ref 98–107)
Potassium: 3.7 mEq/L (ref 3.5–5.1)
Sodium: 139 mEq/L (ref 136–145)
Total Protein: 7 g/dL (ref 6.4–8.3)

## 2012-03-13 MED ORDER — SODIUM CHLORIDE 0.9 % IV SOLN
Freq: Once | INTRAVENOUS | Status: AC
  Administered 2012-03-13: 10:00:00 via INTRAVENOUS

## 2012-03-13 MED ORDER — ZOLEDRONIC ACID 4 MG/100ML IV SOLN
4.0000 mg | Freq: Once | INTRAVENOUS | Status: AC
  Administered 2012-03-13: 4 mg via INTRAVENOUS
  Filled 2012-03-13: qty 100

## 2012-03-13 NOTE — Telephone Encounter (Signed)
gv and printed appt schedule for pt for April...the patient aware central scheduling will contact with d/t of bone scan

## 2012-03-13 NOTE — Patient Instructions (Addendum)
Zoledronic Acid injection (Hypercalcemia, Oncology) What is this medicine? ZOLEDRONIC ACID (ZOE le dron ik AS id) lowers the amount of calcium loss from bone. It is used to treat too much calcium in your blood from cancer. It is also used to prevent complications of cancer that has spread to the bone. This medicine may be used for other purposes; ask your health care provider or pharmacist if you have questions. What should I tell my health care provider before I take this medicine? They need to know if you have any of these conditions: -aspirin-sensitive asthma -dental disease -kidney disease -an unusual or allergic reaction to zoledronic acid, other medicines, foods, dyes, or preservatives -pregnant or trying to get pregnant -breast-feeding How should I use this medicine? This medicine is for infusion into a vein. It is given by a health care professional in a hospital or clinic setting. Talk to your pediatrician regarding the use of this medicine in children. Special care may be needed. Overdosage: If you think you have taken too much of this medicine contact a poison control center or emergency room at once. NOTE: This medicine is only for you. Do not share this medicine with others. What if I miss a dose? It is important not to miss your dose. Call your doctor or health care professional if you are unable to keep an appointment. What may interact with this medicine? -certain antibiotics given by injection -NSAIDs, medicines for pain and inflammation, like ibuprofen or naproxen -some diuretics like bumetanide, furosemide -teriparatide -thalidomide This list may not describe all possible interactions. Give your health care provider a list of all the medicines, herbs, non-prescription drugs, or dietary supplements you use. Also tell them if you smoke, drink alcohol, or use illegal drugs. Some items may interact with your medicine. What should I watch for while using this medicine? Visit  your doctor or health care professional for regular checkups. It may be some time before you see the benefit from this medicine. Do not stop taking your medicine unless your doctor tells you to. Your doctor may order blood tests or other tests to see how you are doing. Women should inform their doctor if they wish to become pregnant or think they might be pregnant. There is a potential for serious side effects to an unborn child. Talk to your health care professional or pharmacist for more information. You should make sure that you get enough calcium and vitamin D while you are taking this medicine. Discuss the foods you eat and the vitamins you take with your health care professional. Some people who take this medicine have severe bone, joint, and/or muscle pain. This medicine may also increase your risk for a broken thigh bone. Tell your doctor right away if you have pain in your upper leg or groin. Tell your doctor if you have any pain that does not go away or that gets worse. What side effects may I notice from receiving this medicine? Side effects that you should report to your doctor or health care professional as soon as possible: -allergic reactions like skin rash, itching or hives, swelling of the face, lips, or tongue -anxiety, confusion, or depression -breathing problems -changes in vision -feeling faint or lightheaded, falls -jaw burning, cramping, pain -muscle cramps, stiffness, or weakness -trouble passing urine or change in the amount of urine Side effects that usually do not require medical attention (report to your doctor or health care professional if they continue or are bothersome): -bone, joint, or muscle pain -  fever -hair loss -irritation at site where injected -loss of appetite -nausea, vomiting -stomach upset -tired This list may not describe all possible side effects. Call your doctor for medical advice about side effects. You may report side effects to FDA at  1-800-FDA-1088. Where should I keep my medicine? This drug is given in a hospital or clinic and will not be stored at home. NOTE: This sheet is a summary. It may not cover all possible information. If you have questions about this medicine, talk to your doctor, pharmacist, or health care provider.  2012, Elsevier/Gold Standard. (08/18/2010 9:06:58 AM) 

## 2012-03-13 NOTE — Progress Notes (Signed)
   [facility]    OFFICE PROGRESS NOTE   INTERVAL HISTORY:   She returns as scheduled. She continues daily Revlimid. Occasional diarrhea and to numbness. The symptoms are not consistent. No back pain. No recent infection.  Objective:  Vital signs in last 24 hours:  Blood pressure 154/94, pulse 82, temperature 97.2 F (36.2 C), temperature source Oral, resp. rate 20, height 5' 5.5" (1.664 m), weight 202 lb 1.6 oz (91.672 kg).    Resp: Lungs clear bilaterally Cardio: Regular rate and rhythm GI: No hepatomegaly Vascular: No leg edema   Lab Results:  Lab Results  Component Value Date   WBC 3.6* 03/13/2012   HGB 13.5 03/13/2012   HCT 41.3 03/13/2012   MCV 88.6 03/13/2012   PLT 224 03/13/2012   ANC 1.3  BUN 14, creatinine 1.0, calcium 9.1, potassium 3.7  12/20/2011-serum M spike-not detected, kappa light chains 4.06, lambda light chain 3.8  Medications: I have reviewed the patient's current medications.  Assessment/Plan: 1. Multiple myeloma, status post 7 cycles of Revlimid/Decadron and Velcade with marked clinical and laboratory improvement. She is status post melphalan conditioning, followed by an autologous stem cell infusion 09/10/2008. She began maintenance Revlimid 01/04/2009. The serum M-spike was not detected on an SPEP in October of 2013. She continues maintenance Revlimid.  2. Neutropenia while on Revlimid at a dose of 10 mg daily. The Revlimid dose was reduced to 5 mg in September 2011 due to persistent neutropenia and a hospital admission with pneumonia/sepsis. Mild neutropenia persists. 3. Admission 11/03/2009 with left lung pneumonia and sepsis syndrome. No specific organism was cultured during the hospital admission. She completed outpatient Levaquin therapy.  4. 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  5. Thoracic spine compression fracture, status post a kyphoplasty 12/05/2007. A metastatic  bone survey was unchanged in October of 2012. 6. Tiny pulmonary embolism on a CT 11/20/2007, now maintained off of Coumadin.  7. Chronic tachycardia on repeat office visits at the [facility].  8. Hypertension-I recommended she schedule upon the with her primary physician to evaluate the hypertension      Disposition:  Ms. Flud appears stable. She will receive Zometa today. She will continue maintenance Revlimid. She will be scheduled for an office visit, Zometa, and a bone survey in 3 months.   Thornton Papas, MD  03/13/2012  2:10 PM

## 2012-03-17 LAB — PROTEIN ELECTROPHORESIS, SERUM
Beta 2: 7.7 % — ABNORMAL HIGH (ref 3.2–6.5)
Gamma Globulin: 20.3 % — ABNORMAL HIGH (ref 11.1–18.8)

## 2012-03-17 LAB — KAPPA/LAMBDA LIGHT CHAINS: Kappa free light chain: 3.71 mg/dL — ABNORMAL HIGH (ref 0.33–1.94)

## 2012-03-19 ENCOUNTER — Encounter: Payer: Self-pay | Admitting: *Deleted

## 2012-04-01 ENCOUNTER — Other Ambulatory Visit: Payer: Self-pay | Admitting: *Deleted

## 2012-04-01 NOTE — Telephone Encounter (Signed)
THIS REFILL REQUEST FOR REVLIMID WAS GIVEN TO DR.SHERRILL'S NURSE, AMY HORTON,RN. 

## 2012-04-02 ENCOUNTER — Other Ambulatory Visit: Payer: Self-pay | Admitting: *Deleted

## 2012-04-02 DIAGNOSIS — C9000 Multiple myeloma not having achieved remission: Secondary | ICD-10-CM

## 2012-04-02 MED ORDER — LENALIDOMIDE 5 MG PO CAPS: 5.0000 mg | ORAL_CAPSULE | Freq: Every day | ORAL | Status: DC

## 2012-04-08 NOTE — Telephone Encounter (Signed)
RECEIVED A FAX FROM [facility] CONCERNING A CONFIRMATION OF PRESCRIPTION SHIPMENT FOR REVLIMID ON 04/07/12.

## 2012-04-30 ENCOUNTER — Other Ambulatory Visit: Payer: Self-pay | Admitting: *Deleted

## 2012-04-30 DIAGNOSIS — C9000 Multiple myeloma not having achieved remission: Secondary | ICD-10-CM

## 2012-04-30 MED ORDER — LENALIDOMIDE 5 MG PO CAPS: 5.0000 mg | ORAL_CAPSULE | Freq: Every day | ORAL | Status: DC

## 2012-05-07 NOTE — Telephone Encounter (Signed)
[facility] faxed confirmation of prescription shipment.  Revlimid was shipped 05-06-2012 with next business day delivery.

## 2012-05-28 ENCOUNTER — Other Ambulatory Visit: Payer: Self-pay | Admitting: *Deleted

## 2012-05-28 DIAGNOSIS — C9000 Multiple myeloma not having achieved remission: Secondary | ICD-10-CM

## 2012-05-28 MED ORDER — LENALIDOMIDE 5 MG PO CAPS: 5.0000 mg | ORAL_CAPSULE | Freq: Every day | ORAL | Status: DC

## 2012-05-28 NOTE — Telephone Encounter (Signed)
Refill request from [facility] for Revlimid left with Dr Truett Perna for approval.

## 2012-05-28 NOTE — Addendum Note (Signed)
Addended by: Laroy Apple E on: 05/28/2012 04:00 PM   Modules accepted: Orders

## 2012-06-11 ENCOUNTER — Ambulatory Visit (HOSPITAL_COMMUNITY)
Admission: RE | Admit: 2012-06-11 | Discharge: 2012-06-11 | Disposition: A | Discharge status: Home / Self Care | Payer: BC Managed Care – PPO | Source: Ambulatory Visit | Attending: Oncology | Admitting: Oncology

## 2012-06-11 DIAGNOSIS — C9000 Multiple myeloma not having achieved remission: Secondary | ICD-10-CM

## 2012-06-12 ENCOUNTER — Ambulatory Visit (HOSPITAL_BASED_OUTPATIENT_CLINIC_OR_DEPARTMENT_OTHER): Payer: BC Managed Care – PPO

## 2012-06-12 ENCOUNTER — Other Ambulatory Visit: Payer: BC Managed Care – PPO | Admitting: Lab

## 2012-06-12 ENCOUNTER — Telehealth: Payer: Self-pay | Admitting: *Deleted

## 2012-06-12 ENCOUNTER — Other Ambulatory Visit (HOSPITAL_BASED_OUTPATIENT_CLINIC_OR_DEPARTMENT_OTHER): Payer: BC Managed Care – PPO | Admitting: Lab

## 2012-06-12 ENCOUNTER — Ambulatory Visit (HOSPITAL_BASED_OUTPATIENT_CLINIC_OR_DEPARTMENT_OTHER): Payer: BC Managed Care – PPO | Admitting: Oncology

## 2012-06-12 VITALS — BP 132/71 | HR 70 | Temp 97.4°F | Resp 18 | Ht 65.5 in | Wt 200.8 lb

## 2012-06-12 DIAGNOSIS — C9000 Multiple myeloma not having achieved remission: Secondary | ICD-10-CM

## 2012-06-12 DIAGNOSIS — D702 Other drug-induced agranulocytosis: Secondary | ICD-10-CM

## 2012-06-12 DIAGNOSIS — R000 Tachycardia, unspecified: Secondary | ICD-10-CM

## 2012-06-12 DIAGNOSIS — Z862 Personal history of diseases of the blood and blood-forming organs and certain disorders involving the immune mechanism: Secondary | ICD-10-CM

## 2012-06-12 LAB — BASIC METABOLIC PANEL (CC13)
BUN: 11.5 mg/dL (ref 7.0–26.0)
Chloride: 110 mEq/L — ABNORMAL HIGH (ref 98–107)
Potassium: 3.8 mEq/L (ref 3.5–5.1)

## 2012-06-12 LAB — CBC WITH DIFFERENTIAL/PLATELET
BASO%: 1 % (ref 0.0–2.0)
Eosinophils Absolute: 0.2 10*3/uL (ref 0.0–0.5)
LYMPH%: 43.7 % (ref 14.0–49.7)
MCHC: 32.5 g/dL (ref 31.5–36.0)
MONO#: 0.4 10*3/uL (ref 0.1–0.9)
NEUT#: 1.1 10*3/uL — ABNORMAL LOW (ref 1.5–6.5)
Platelets: 236 10*3/uL (ref 145–400)
RBC: 4.19 10*6/uL (ref 3.70–5.45)
RDW: 14.9 % — ABNORMAL HIGH (ref 11.2–14.5)
WBC: 3 10*3/uL — ABNORMAL LOW (ref 3.9–10.3)
lymph#: 1.3 10*3/uL (ref 0.9–3.3)

## 2012-06-12 MED ORDER — ZOLEDRONIC ACID 4 MG/100ML IV SOLN
4.0000 mg | Freq: Once | INTRAVENOUS | Status: AC
  Administered 2012-06-12: 4 mg via INTRAVENOUS
  Filled 2012-06-12: qty 100

## 2012-06-12 NOTE — Progress Notes (Signed)
   [facility]    OFFICE PROGRESS NOTE   INTERVAL HISTORY:   She returns as scheduled. She continues maintenance Revlimid. Occasional tingling in the toes. No other neuropathy symptoms. No pain. No recent infection. She saw Dr. Vicente Serene last month.  Objective:  Vital signs in last 24 hours:  Blood pressure 132/71, pulse 70, temperature 97.4 F (36.3 C), temperature source Oral, resp. rate 18, height 5' 5.5" (1.664 m), weight 200 lb 12.8 oz (91.082 kg).    HEENT: no thrush or ulcers Resp: lungs clear bilaterally Cardio: regular rate and rhythm GI: no hepatosplenomegaly Vascular: the left lower leg is slightly larger than the right side   Lab Results:  Lab Results  Component Value Date   WBC 3.0* 06/12/2012   HGB 12.2 06/12/2012   HCT 37.5 06/12/2012   MCV 89.5 06/12/2012   PLT 236 06/12/2012  ANC 1.1  X-rays:bone survey 06/11/2012-no interval change in scattered multifocal lytic lesions. No new lytic lesions.  Medications: I have reviewed the patient's current medications.  Assessment/Plan: 1. Multiple myeloma, status post 7 cycles of Revlimid/Decadron and Velcade with marked clinical and laboratory improvement. She is status post melphalan conditioning, followed by an autologous stem cell infusion 09/10/2008. She began maintenance Revlimid 01/04/2009. The serum M-spike was not detected on an SPEP in January 2014. She continues maintenance Revlimid.  2. Neutropenia while on Revlimid at a dose of 10 mg daily. The Revlimid dose was reduced to 5 mg in September 2011 due to persistent neutropenia and a hospital admission with pneumonia/sepsis. Mild neutropenia persists. 3. Admission 11/03/2009 with left lung pneumonia and sepsis syndrome. No specific organism was cultured during the hospital admission. She completed outpatient Levaquin therapy.  4. 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  5. Thoracic  spine compression fracture, status post a kyphoplasty 12/05/2007. A metastatic bone survey was unchanged on 06/11/2012 6. Tiny pulmonary embolism on a CT 11/20/2007, now maintained off of Coumadin.  7. Chronic tachycardia on repeat office visits at the [facility].        8.   History ofHypertension    Disposition:  She appears stable. She will receive Zometa today. The plan is to continue maintenance Revlimid and every 3 month Zometa. We will followup on the serum protein electrophoresis and serum light chains from today. Ms. Besser will return for Zometa in 3 months. She is scheduled for an office visit in 6 months.   Thornton Papas, MD  06/12/2012  10:19 AM

## 2012-06-12 NOTE — Telephone Encounter (Signed)
Per staff message and POF I have scheduled appts.  JMW  

## 2012-06-12 NOTE — Patient Instructions (Addendum)
[facility] Discharge Instructions for Patients  Today you received the following: Zometa    Any questions or concerns call [phone], call [phone] for emergencies.   BELOW ARE SYMPTOMS THAT SHOULD BE REPORTED IMMEDIATELY:  *FEVER GREATER THAN 100.5 F  *CHILLS WITH OR WITHOUT FEVER  NAUSEA AND VOMITING THAT IS NOT CONTROLLED WITH YOUR NAUSEA MEDICATION  *UNUSUAL SHORTNESS OF BREATH  *UNUSUAL BRUISING OR BLEEDING  TENDERNESS IN MOUTH AND THROAT WITH OR WITHOUT PRESENCE OF ULCERS  *URINARY PROBLEMS  *BOWEL PROBLEMS  UNUSUAL RASH Items with * indicate a potential emergency and should be followed up as soon as possible.  One of the nurses will contact you 24 hours after your treatment. Please let the nurse know about any problems that you may have experienced. Feel free to call the clinic you have any questions or concerns. The clinic phone number is [phone].   I have been informed and understand all the instructions given to me. I know to contact the clinic, my physician, or go to the Emergency Department if any problems should occur. I do not have any questions at this time, but understand that I may call the clinic during office hours   should I have any questions or need assistance in obtaining follow up care.    __________________________________________  _____________  __________ Signature of Patient or Authorized Representative            Date                   Time    __________________________________________ Nurse's Signature

## 2012-06-16 LAB — KAPPA/LAMBDA LIGHT CHAINS
Kappa free light chain: 4.99 mg/dL — ABNORMAL HIGH (ref 0.33–1.94)
Kappa:Lambda Ratio: 1.44 (ref 0.26–1.65)
Lambda Free Lght Chn: 3.46 mg/dL — ABNORMAL HIGH (ref 0.57–2.63)

## 2012-06-16 LAB — PROTEIN ELECTROPHORESIS, SERUM
Albumin ELP: 49 % — ABNORMAL LOW (ref 55.8–66.1)
Alpha-1-Globulin: 4.8 % (ref 2.9–4.9)
Alpha-2-Globulin: 13.2 % — ABNORMAL HIGH (ref 7.1–11.8)
Beta Globulin: 6.1 % (ref 4.7–7.2)
Total Protein, Serum Electrophoresis: 6.8 g/dL (ref 6.0–8.3)

## 2012-06-17 ENCOUNTER — Telehealth: Payer: Self-pay | Admitting: *Deleted

## 2012-06-17 NOTE — Telephone Encounter (Signed)
Left VM with M-spike and light chain results.

## 2012-06-17 NOTE — Telephone Encounter (Signed)
Message copied by Wandalee Ferdinand on Tue Jun 17, 2012 11:22 AM ------      Message from: Thornton Papas B      Created: Mon Jun 16, 2012  8:56 PM       Please call patient, m-spike is negative, light chains are stable ------

## 2012-07-02 ENCOUNTER — Other Ambulatory Visit: Payer: Self-pay | Admitting: *Deleted

## 2012-07-02 DIAGNOSIS — C9000 Multiple myeloma not having achieved remission: Secondary | ICD-10-CM

## 2012-07-02 MED ORDER — LENALIDOMIDE 5 MG PO CAPS: 5.0000 mg | ORAL_CAPSULE | Freq: Every day | ORAL | Status: DC

## 2012-07-04 NOTE — Telephone Encounter (Signed)
RECEIVED A FAX FROM [facility] CONCERNING A CONFIRMATION OF PRESCRIPTION SHIPMENT FOR REVLIMID ON 07/03/12.

## 2012-07-25 ENCOUNTER — Other Ambulatory Visit: Payer: Self-pay | Admitting: *Deleted

## 2012-07-25 DIAGNOSIS — C9000 Multiple myeloma not having achieved remission: Secondary | ICD-10-CM

## 2012-07-25 MED ORDER — LENALIDOMIDE 5 MG PO CAPS: 5.0000 mg | ORAL_CAPSULE | Freq: Every day | ORAL | Status: DC

## 2012-07-31 NOTE — Telephone Encounter (Signed)
RECEIVED A FAX FROM [facility] CONCERNING A CONFIRMATION OF PRESCRIPTION SHIPMENT FOR REVLIMID ON 07/30/12.

## 2012-08-12 ENCOUNTER — Other Ambulatory Visit: Payer: BC Managed Care – PPO | Admitting: Lab

## 2012-08-22 ENCOUNTER — Other Ambulatory Visit: Payer: Self-pay | Admitting: *Deleted

## 2012-08-22 NOTE — Telephone Encounter (Signed)
Received refill request for revlimid & placed on Dr Kalman Drape desk.

## 2012-08-25 ENCOUNTER — Telehealth: Payer: Self-pay | Admitting: *Deleted

## 2012-08-25 ENCOUNTER — Other Ambulatory Visit: Payer: Self-pay | Admitting: *Deleted

## 2012-08-25 DIAGNOSIS — C9000 Multiple myeloma not having achieved remission: Secondary | ICD-10-CM

## 2012-08-25 MED ORDER — LENALIDOMIDE 5 MG PO CAPS: 5.0000 mg | ORAL_CAPSULE | Freq: Every day | ORAL | Status: DC

## 2012-08-25 NOTE — Telephone Encounter (Signed)
Notified patient that she needs to complete her 6 month survey with [facility].

## 2012-08-25 NOTE — Telephone Encounter (Signed)
[facility] faxed Revlimid refill request.  Request to provider for review.

## 2012-08-28 NOTE — Telephone Encounter (Signed)
[facility] sent facsimile confirmation of Revlimid prescription shipment.  Was shipped 08-26-2012 with next business day delivery.

## 2012-09-11 ENCOUNTER — Ambulatory Visit (HOSPITAL_BASED_OUTPATIENT_CLINIC_OR_DEPARTMENT_OTHER): Payer: BC Managed Care – PPO

## 2012-09-11 ENCOUNTER — Ambulatory Visit (HOSPITAL_BASED_OUTPATIENT_CLINIC_OR_DEPARTMENT_OTHER): Payer: BC Managed Care – PPO | Admitting: Lab

## 2012-09-11 VITALS — BP 143/85 | HR 78 | Temp 98.1°F | Resp 20

## 2012-09-11 DIAGNOSIS — C9000 Multiple myeloma not having achieved remission: Secondary | ICD-10-CM

## 2012-09-11 DIAGNOSIS — D649 Anemia, unspecified: Secondary | ICD-10-CM

## 2012-09-11 LAB — CBC WITH DIFFERENTIAL/PLATELET
Basophils Absolute: 0 10*3/uL (ref 0.0–0.1)
EOS%: 5.1 % (ref 0.0–7.0)
Eosinophils Absolute: 0.2 10*3/uL (ref 0.0–0.5)
HCT: 39.8 % (ref 34.8–46.6)
HGB: 13.1 g/dL (ref 11.6–15.9)
MCH: 29.2 pg (ref 25.1–34.0)
MCV: 88.6 fL (ref 79.5–101.0)
MONO%: 12.6 % (ref 0.0–14.0)
NEUT#: 1.2 10*3/uL — ABNORMAL LOW (ref 1.5–6.5)
NEUT%: 37.1 % — ABNORMAL LOW (ref 38.4–76.8)
lymph#: 1.5 10*3/uL (ref 0.9–3.3)

## 2012-09-11 LAB — BASIC METABOLIC PANEL (CC13)
CO2: 23 mEq/L (ref 22–29)
Chloride: 109 mEq/L (ref 98–109)
Creatinine: 1 mg/dL (ref 0.6–1.1)
Potassium: 3.7 mEq/L (ref 3.5–5.1)

## 2012-09-11 MED ORDER — ZOLEDRONIC ACID 4 MG/100ML IV SOLN
4.0000 mg | Freq: Once | INTRAVENOUS | Status: AC
  Administered 2012-09-11: 4 mg via INTRAVENOUS
  Filled 2012-09-11: qty 100

## 2012-09-11 MED ORDER — SODIUM CHLORIDE 0.9 % IJ SOLN
3.0000 mL | Freq: Once | INTRAMUSCULAR | Status: DC | PRN
  Filled 2012-09-11: qty 10

## 2012-09-11 MED ORDER — SODIUM CHLORIDE 0.9 % IV SOLN
Freq: Once | INTRAVENOUS | Status: AC
  Administered 2012-09-11: 09:00:00 via INTRAVENOUS

## 2012-09-11 NOTE — Patient Instructions (Addendum)
Zoledronic Acid injection (Paget's Disease, Osteoporosis) What is this medicine? ZOLEDRONIC ACID (ZOE le dron ik AS id) lowers the amount of calcium loss from bone. It is used to treat Paget's disease and osteoporosis in women. This medicine may be used for other purposes; ask your health care provider or pharmacist if you have questions. What should I tell my health care provider before I take this medicine? They need to know if you have any of these conditions: -aspirin-sensitive asthma -dental disease -kidney disease -low levels of calcium in the blood -past surgery on the parathyroid gland or intestines -an unusual or allergic reaction to zoledronic acid, other medicines, foods, dyes, or preservatives -pregnant or trying to get pregnant -breast-feeding How should I use this medicine? This medicine is for infusion into a vein. It is given by a health care professional in a hospital or clinic setting. Talk to your pediatrician regarding the use of this medicine in children. This medicine is not approved for use in children. Overdosage: If you think you have taken too much of this medicine contact a poison control center or emergency room at once. NOTE: This medicine is only for you. Do not share this medicine with others. What if I miss a dose? It is important not to miss your dose. Call your doctor or health care professional if you are unable to keep an appointment. What may interact with this medicine? -certain antibiotics given by injection -NSAIDs, medicines for pain and inflammation, like ibuprofen or naproxen -some diuretics like bumetanide, furosemide -teriparatide This list may not describe all possible interactions. Give your health care provider a list of all the medicines, herbs, non-prescription drugs, or dietary supplements you use. Also tell them if you smoke, drink alcohol, or use illegal drugs. Some items may interact with your medicine. What should I watch for while  using this medicine? Visit your doctor or health care professional for regular checkups. It may be some time before you see the benefit from this medicine. Do not stop taking your medicine unless your doctor tells you to. Your doctor may order blood tests or other tests to see how you are doing. Women should inform their doctor if they wish to become pregnant or think they might be pregnant. There is a potential for serious side effects to an unborn child. Talk to your health care professional or pharmacist for more information. You should make sure that you get enough calcium and vitamin D while you are taking this medicine. Discuss the foods you eat and the vitamins you take with your health care professional. Some people who take this medicine have severe bone, joint, and/or muscle pain. This medicine may also increase your risk for a broken thigh bone. Tell your doctor right away if you have pain in your upper leg or groin. Tell your doctor if you have any pain that does not go away or that gets worse. What side effects may I notice from receiving this medicine? Side effects that you should report to your doctor or health care professional as soon as possible: -allergic reactions like skin rash, itching or hives, swelling of the face, lips, or tongue -breathing problems -changes in vision -feeling faint or lightheaded, falls -jaw burning, cramping, or pain -muscle cramps, stiffness, or weakness -trouble passing urine or change in the amount of urine Side effects that usually do not require medical attention (report to your doctor or health care professional if they continue or are bothersome): -bone, joint, or muscle pain -fever -  irritation at site where injected -loss of appetite -nausea, vomiting -stomach upset -tired This list may not describe all possible side effects. Call your doctor for medical advice about side effects. You may report side effects to FDA at 1-800-FDA-1088. Where  should I keep my medicine? This drug is given in a hospital or clinic and will not be stored at home. NOTE: This sheet is a summary. It may not cover all possible information. If you have questions about this medicine, talk to your doctor, pharmacist, or health care provider.  2013, Elsevier/Gold Standard. (08/18/2010 9:08:15 AM)  

## 2012-09-15 LAB — PROTEIN ELECTROPHORESIS, SERUM, WITH REFLEX
Albumin ELP: 49.4 % — ABNORMAL LOW (ref 55.8–66.1)
Alpha-2-Globulin: 11.4 % (ref 7.1–11.8)
Beta 2: 7.1 % — ABNORMAL HIGH (ref 3.2–6.5)
Beta Globulin: 6.5 % (ref 4.7–7.2)

## 2012-09-15 LAB — IGG, IGA, IGM
IgA: 677 mg/dL — ABNORMAL HIGH (ref 69–380)
IgG (Immunoglobin G), Serum: 1480 mg/dL (ref 690–1700)
IgM, Serum: 29 mg/dL — ABNORMAL LOW (ref 52–322)

## 2012-09-15 LAB — KAPPA/LAMBDA LIGHT CHAINS
Kappa:Lambda Ratio: 1.06 (ref 0.26–1.65)
Lambda Free Lght Chn: 3.9 mg/dL — ABNORMAL HIGH (ref 0.57–2.63)

## 2012-09-22 ENCOUNTER — Other Ambulatory Visit: Payer: Self-pay | Admitting: *Deleted

## 2012-09-22 DIAGNOSIS — C9000 Multiple myeloma not having achieved remission: Secondary | ICD-10-CM

## 2012-09-22 NOTE — Telephone Encounter (Signed)
THIS REFILL REQUEST FOR REVLIMID WAS GIVEN TO DR.SHERRILL'S NURSE, AMY HORTON,RN. 

## 2012-09-23 MED ORDER — LENALIDOMIDE 5 MG PO CAPS: 5.0000 mg | ORAL_CAPSULE | Freq: Every day | ORAL | Status: DC

## 2012-09-23 NOTE — Addendum Note (Signed)
Addended by: Arvilla Meres on: 09/23/2012 12:03 PM   Modules accepted: Orders

## 2012-09-26 NOTE — Telephone Encounter (Signed)
RECEIVED A FAX FROM [facility]ERNING A CONFIRMATION OF PRESCRIPTION SHIPMENT FOR REVLIMID ON 09/25/12.

## 2012-10-20 ENCOUNTER — Other Ambulatory Visit: Payer: Self-pay | Admitting: *Deleted

## 2012-10-20 DIAGNOSIS — C9000 Multiple myeloma not having achieved remission: Secondary | ICD-10-CM

## 2012-10-20 NOTE — Telephone Encounter (Signed)
THIS REFILL REQUEST FOR REVLIMID WAS GIVEN TO DR.SHERRILL'S NURSE, TANYA WHITLOCK,RN. 

## 2012-10-21 MED ORDER — LENALIDOMIDE 5 MG PO CAPS: 5.0000 mg | ORAL_CAPSULE | Freq: Every day | ORAL | Status: DC

## 2012-10-21 NOTE — Addendum Note (Signed)
Addended by: Arvilla Meres on: 10/21/2012 12:19 PM   Modules accepted: Orders

## 2012-10-22 ENCOUNTER — Telehealth: Payer: Self-pay | Admitting: *Deleted

## 2012-10-22 ENCOUNTER — Encounter: Payer: Self-pay | Admitting: Oncology

## 2012-10-22 NOTE — Telephone Encounter (Signed)
Facsimile received from [facility] that patient's Revlimid has been approved effective 09-21-2012 through 10-21-2012.

## 2012-10-22 NOTE — Progress Notes (Signed)
[facility], [id number], approved revlimid from 09/21/12-10/21/13.

## 2012-10-27 NOTE — Telephone Encounter (Signed)
RECEIVED A FAX FROM [facility]ERNING A CONFIRMATION OF PRESCRIPTION SHIPMENT FOR REVLIMID ON 10/23/12.

## 2012-11-18 ENCOUNTER — Other Ambulatory Visit: Payer: Self-pay | Admitting: *Deleted

## 2012-11-18 DIAGNOSIS — C9000 Multiple myeloma not having achieved remission: Secondary | ICD-10-CM

## 2012-11-18 MED ORDER — LENALIDOMIDE 5 MG PO CAPS: 5.0000 mg | ORAL_CAPSULE | Freq: Every day | ORAL | Status: DC

## 2012-11-18 NOTE — Telephone Encounter (Signed)
THIS REFILL REQUEST FOR REVLIMID WAS PLACED ON DR.SHERRILL'S DESK. 

## 2012-11-19 NOTE — Telephone Encounter (Signed)
Faxed confirmation from [facility] that Revlimid script was received and being processed.

## 2012-11-21 NOTE — Telephone Encounter (Signed)
RECEIVED A FAX FROM [facility] CON[facility]ERNING A CONFIRMATION OF PRESCRIPTION SHIPMENT FOR REVLIMID ON 11/20/12.

## 2012-12-11 ENCOUNTER — Other Ambulatory Visit: Payer: Self-pay | Admitting: *Deleted

## 2012-12-12 ENCOUNTER — Other Ambulatory Visit (HOSPITAL_BASED_OUTPATIENT_CLINIC_OR_DEPARTMENT_OTHER): Payer: BC Managed Care – PPO

## 2012-12-12 ENCOUNTER — Other Ambulatory Visit: Payer: Self-pay | Admitting: Oncology

## 2012-12-12 ENCOUNTER — Ambulatory Visit (HOSPITAL_BASED_OUTPATIENT_CLINIC_OR_DEPARTMENT_OTHER): Payer: BC Managed Care – PPO

## 2012-12-12 ENCOUNTER — Telehealth: Payer: Self-pay | Admitting: *Deleted

## 2012-12-12 ENCOUNTER — Ambulatory Visit (HOSPITAL_BASED_OUTPATIENT_CLINIC_OR_DEPARTMENT_OTHER): Payer: BC Managed Care – PPO | Admitting: Nurse Practitioner

## 2012-12-12 ENCOUNTER — Telehealth: Payer: Self-pay | Admitting: Oncology

## 2012-12-12 VITALS — BP 161/81 | HR 83 | Temp 98.2°F | Resp 19 | Ht 65.5 in | Wt 203.4 lb

## 2012-12-12 DIAGNOSIS — R000 Tachycardia, unspecified: Secondary | ICD-10-CM

## 2012-12-12 DIAGNOSIS — C9000 Multiple myeloma not having achieved remission: Secondary | ICD-10-CM

## 2012-12-12 DIAGNOSIS — Z86711 Personal history of pulmonary embolism: Secondary | ICD-10-CM

## 2012-12-12 DIAGNOSIS — Z23 Encounter for immunization: Secondary | ICD-10-CM

## 2012-12-12 DIAGNOSIS — D709 Neutropenia, unspecified: Secondary | ICD-10-CM

## 2012-12-12 DIAGNOSIS — I10 Essential (primary) hypertension: Secondary | ICD-10-CM

## 2012-12-12 DIAGNOSIS — D649 Anemia, unspecified: Secondary | ICD-10-CM

## 2012-12-12 LAB — BASIC METABOLIC PANEL (CC13)
CO2: 22 mEq/L (ref 22–29)
Calcium: 9.6 mg/dL (ref 8.4–10.4)
Chloride: 108 mEq/L (ref 98–109)
Glucose: 92 mg/dl (ref 70–140)
Sodium: 140 mEq/L (ref 136–145)

## 2012-12-12 LAB — CBC WITH DIFFERENTIAL/PLATELET
Basophils Absolute: 0 10*3/uL (ref 0.0–0.1)
EOS%: 5.1 % (ref 0.0–7.0)
Eosinophils Absolute: 0.2 10*3/uL (ref 0.0–0.5)
HCT: 41.8 % (ref 34.8–46.6)
HGB: 14 g/dL (ref 11.6–15.9)
MCH: 29.9 pg (ref 25.1–34.0)
MCV: 89.1 fL (ref 79.5–101.0)
MONO%: 12.9 % (ref 0.0–14.0)
NEUT%: 32.5 % — ABNORMAL LOW (ref 38.4–76.8)
Platelets: 217 10*3/uL (ref 145–400)

## 2012-12-12 MED ORDER — INFLUENZA VAC SPLIT QUAD 0.5 ML IM SUSP
0.5000 mL | Freq: Once | INTRAMUSCULAR | Status: AC
  Administered 2012-12-12: 0.5 mL via INTRAMUSCULAR
  Filled 2012-12-12: qty 0.5

## 2012-12-12 MED ORDER — SODIUM CHLORIDE 0.9 % IV SOLN
Freq: Once | INTRAVENOUS | Status: AC
  Administered 2012-12-12: 11:00:00 via INTRAVENOUS

## 2012-12-12 MED ORDER — ZOLEDRONIC ACID 4 MG/100ML IV SOLN
4.0000 mg | Freq: Once | INTRAVENOUS | Status: AC
  Administered 2012-12-12: 4 mg via INTRAVENOUS
  Filled 2012-12-12: qty 100

## 2012-12-12 NOTE — Patient Instructions (Signed)
Zoledronic Acid injection (Hypercalcemia, Oncology) What is this medicine? ZOLEDRONIC ACID (ZOE le dron ik AS id) lowers the amount of calcium loss from bone. It is used to treat too much calcium in your blood from cancer. It is also used to prevent complications of cancer that has spread to the bone. This medicine may be used for other purposes; ask your health care provider or pharmacist if you have questions. What should I tell my health care provider before I take this medicine? They need to know if you have any of these conditions: -aspirin-sensitive asthma -dental disease -kidney disease -an unusual or allergic reaction to zoledronic acid, other medicines, foods, dyes, or preservatives -pregnant or trying to get pregnant -breast-feeding How should I use this medicine? This medicine is for infusion into a vein. It is given by a health care professional in a hospital or clinic setting. Talk to your pediatrician regarding the use of this medicine in children. Special care may be needed. Overdosage: If you think you have taken too much of this medicine contact a poison control center or emergency room at once. NOTE: This medicine is only for you. Do not share this medicine with others. What if I miss a dose? It is important not to miss your dose. Call your doctor or health care professional if you are unable to keep an appointment. What may interact with this medicine? -certain antibiotics given by injection -NSAIDs, medicines for pain and inflammation, like ibuprofen or naproxen -some diuretics like bumetanide, furosemide -teriparatide -thalidomide This list may not describe all possible interactions. Give your health care provider a list of all the medicines, herbs, non-prescription drugs, or dietary supplements you use. Also tell them if you smoke, drink alcohol, or use illegal drugs. Some items may interact with your medicine. What should I watch for while using this medicine? Visit  your doctor or health care professional for regular checkups. It may be some time before you see the benefit from this medicine. Do not stop taking your medicine unless your doctor tells you to. Your doctor may order blood tests or other tests to see how you are doing. Women should inform their doctor if they wish to become pregnant or think they might be pregnant. There is a potential for serious side effects to an unborn child. Talk to your health care professional or pharmacist for more information. You should make sure that you get enough calcium and vitamin D while you are taking this medicine. Discuss the foods you eat and the vitamins you take with your health care professional. Some people who take this medicine have severe bone, joint, and/or muscle pain. This medicine may also increase your risk for a broken thigh bone. Tell your doctor right away if you have pain in your upper leg or groin. Tell your doctor if you have any pain that does not go away or that gets worse. What side effects may I notice from receiving this medicine? Side effects that you should report to your doctor or health care professional as soon as possible: -allergic reactions like skin rash, itching or hives, swelling of the face, lips, or tongue -anxiety, confusion, or depression -breathing problems -changes in vision -feeling faint or lightheaded, falls -jaw burning, cramping, pain -muscle cramps, stiffness, or weakness -trouble passing urine or change in the amount of urine Side effects that usually do not require medical attention (report to your doctor or health care professional if they continue or are bothersome): -bone, joint, or muscle pain -  fever -hair loss -irritation at site where injected -loss of appetite -nausea, vomiting -stomach upset -tired This list may not describe all possible side effects. Call your doctor for medical advice about side effects. You may report side effects to FDA at  1-800-FDA-1088. Where should I keep my medicine? This drug is given in a hospital or clinic and will not be stored at home. NOTE: This sheet is a summary. It may not cover all possible information. If you have questions about this medicine, talk to your doctor, pharmacist, or health care provider.  2013, Elsevier/Gold Standard. (08/18/2010 9:06:58 AM)  

## 2012-12-12 NOTE — Telephone Encounter (Signed)
Per staff message and POF I have scheduled appts.  JMW  

## 2012-12-12 NOTE — Progress Notes (Signed)
OFFICE PROGRESS NOTE  Interval history:  Carrie Huber is a 62 year old woman with multiple myeloma. She is on maintenance Revlimid. She is seen today for scheduled followup.  She feels well. No interim illnesses or infections. No fevers, sweats or chills. She has a good appetite and good energy level. She denies pain. No shortness of breath, cough or chest pain. No leg swelling or calf pain. She occasionally notes mild numbness in a few toes. No skin rash.   Objective: Blood pressure 161/81, pulse 83, temperature 98.2 F (36.8 C), temperature source Oral, resp. rate 19, height 5' 5.5" (1.664 m), weight 203 lb 6.4 oz (92.262 kg), SpO2 100.00%.  Oropharynx is without thrush or ulceration. Lungs are clear. No wheezes or rales. Regular cardiac rhythm. Abdomen is soft and nontender. No organomegaly. Extremities are without edema. Calves are nontender. Motor strength 5 over 5. No skin rash. Vibratory sense normal to very mildly decreased over the fingertips per tuning fork exam.  Lab Results: Lab Results  Component Value Date   WBC 3.1* 12/12/2012   HGB 14.0 12/12/2012   HCT 41.8 12/12/2012   MCV 89.1 12/12/2012   PLT 217 12/12/2012    Chemistry:    Chemistry      Component Value Date/Time   NA 141 09/11/2012 0824   NA 137 09/18/2011 0813   K 3.7 09/11/2012 0824   K 3.4* 09/18/2011 0813   CL 110* 06/12/2012 0842   CL 103 09/18/2011 0813   CO2 23 09/11/2012 0824   CO2 23 09/18/2011 0813   BUN 11.4 09/11/2012 0824   BUN 11 09/18/2011 0813   CREATININE 1.0 09/11/2012 0824   CREATININE 0.97 09/18/2011 0813   GLU 143* 11/04/2007 0918      Component Value Date/Time   CALCIUM 9.1 09/11/2012 0824   CALCIUM 9.2 09/18/2011 0813   ALKPHOS 72 03/13/2012 0825   ALKPHOS 86 12/14/2010 1147   AST 13 03/13/2012 0825   AST 22 12/14/2010 1147   ALT 8 03/13/2012 0825   ALT 22 12/14/2010 1147   BILITOT 0.68 03/13/2012 0825   BILITOT 0.5 12/14/2010 1147       Studies/Results: No results found.  Medications: I  have reviewed the patient's current medications.  Assessment/Plan:  1. Multiple myeloma, status post 7 cycles of Revlimid/Decadron and Velcade with marked clinical and laboratory improvement. She is status post melphalan conditioning, followed by an autologous stem cell infusion 09/10/2008. She began maintenance Revlimid 01/04/2009. The serum M-spike was not detected on an SPEP in January 2014. The serum M spike was not detected on an SPEP 09/11/2012. She continues maintenance Revlimid.  2. Neutropenia while on Revlimid at a dose of 10 mg daily. The Revlimid dose was reduced to 5 mg in September 2011 due to persistent neutropenia and a hospital admission with pneumonia/sepsis. Mild neutropenia persists. 3. Admission 11/03/2009 with left lung pneumonia and sepsis syndrome. No specific organism was cultured during the hospital admission. She completed outpatient Levaquin therapy.  4. 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  5. Thoracic spine compression fracture, status post a kyphoplasty 12/05/2007. A metastatic bone survey was unchanged on 06/11/2012. 6. Tiny pulmonary embolism on a CT 11/20/2007, now maintained off of Coumadin.  7. Chronic tachycardia on repeat office visits at the [facility].  8. History of hypertension.  Disposition-[name] appears well. We will followup on the serum protein electrophoresis and serum light chains from today. She will continue maintenance Revlimid.  She is scheduled to receive Zometa today. She will return for labs and Zometa in 3 months. We will see her in followup in 6 months.     Carrie Huber ANP/GNP-BC

## 2012-12-12 NOTE — Telephone Encounter (Signed)
Gave pt appt for lab, md and chemo for Zometa every 3 months until  April 2015

## 2012-12-15 ENCOUNTER — Telehealth: Payer: Self-pay | Admitting: *Deleted

## 2012-12-15 DIAGNOSIS — C9000 Multiple myeloma not having achieved remission: Secondary | ICD-10-CM

## 2012-12-15 NOTE — Telephone Encounter (Signed)
THIS REFILL REQUEST FOR REVLIMID WAS PLACED ON DR.SHERRILL'S DESK. 

## 2012-12-16 LAB — PROTEIN ELECTROPHORESIS, SERUM
Alpha-1-Globulin: 4.4 % (ref 2.9–4.9)
Beta 2: 7.8 % — ABNORMAL HIGH (ref 3.2–6.5)
Beta Globulin: 5.8 % (ref 4.7–7.2)
Gamma Globulin: 20.6 % — ABNORMAL HIGH (ref 11.1–18.8)

## 2012-12-16 MED ORDER — LENALIDOMIDE 5 MG PO CAPS: 5.0000 mg | ORAL_CAPSULE | Freq: Every day | ORAL | Status: DC

## 2012-12-16 NOTE — Addendum Note (Signed)
Addended by: Arvilla Meres on: 12/16/2012 02:02 PM   Modules accepted: Orders

## 2012-12-18 NOTE — Telephone Encounter (Signed)
RECEIVED A FAX FROM [facility]ERNING A CONFIRMATION OF PRESCRIPTION SHIPMENT FOR REVLIMID ON 12/17/12.

## 2013-01-12 ENCOUNTER — Other Ambulatory Visit: Payer: Self-pay | Admitting: *Deleted

## 2013-01-12 DIAGNOSIS — C9000 Multiple myeloma not having achieved remission: Secondary | ICD-10-CM

## 2013-01-12 NOTE — Telephone Encounter (Signed)
THIS REFILL REQUEST FOR REVLIMID WAS GIVEN TO DR.SHERRILL'S NURSE, SUSAN COWARD,RN. 

## 2013-01-13 MED ORDER — LENALIDOMIDE 5 MG PO CAPS: 5.0000 mg | ORAL_CAPSULE | Freq: Every day | ORAL | Status: DC

## 2013-01-13 NOTE — Addendum Note (Signed)
Addended by: Arvilla Meres on: 01/13/2013 02:43 PM   Modules accepted: Orders

## 2013-01-16 NOTE — Telephone Encounter (Signed)
RECEIVED A FAX FROM [facility]ERNING A CONFIRMATION OF PRESCRIPTION SHIPMENT FOR REVLIMID ON 01/15/13.

## 2013-02-06 ENCOUNTER — Other Ambulatory Visit: Payer: Self-pay | Admitting: *Deleted

## 2013-02-06 DIAGNOSIS — C9000 Multiple myeloma not having achieved remission: Secondary | ICD-10-CM

## 2013-02-06 MED ORDER — LENALIDOMIDE 5 MG PO CAPS: 5.0000 mg | ORAL_CAPSULE | Freq: Every day | ORAL | Status: DC

## 2013-02-06 NOTE — Telephone Encounter (Signed)
THIS REFILL REQUEST FOR REVLIMID WAS GIVEN TO DR.SHERRILL'S NURSE, TANYA WHITLOCK,RN. 

## 2013-02-16 NOTE — Telephone Encounter (Signed)
RECEIVED A FAX FROM [facility] CON[facility]ERNING A CONFIRMATION OF PRESCRIPTION SHIPMENT FOR REVLIMID ON 02/13/13.

## 2013-03-09 ENCOUNTER — Other Ambulatory Visit: Payer: Self-pay | Admitting: *Deleted

## 2013-03-09 DIAGNOSIS — C9000 Multiple myeloma not having achieved remission: Secondary | ICD-10-CM

## 2013-03-09 NOTE — Telephone Encounter (Signed)
THIS REFILL REQUEST FOR REVLIMID WAS GIVEN TO DR.SHERRILL'S NURSE, SUSAN COWARD,RN.

## 2013-03-10 MED ORDER — LENALIDOMIDE 5 MG PO CAPS: 5.0000 mg | ORAL_CAPSULE | Freq: Every day | ORAL | Status: DC

## 2013-03-10 NOTE — Addendum Note (Signed)
Addended by: Wyonia Hough on: 03/10/2013 01:25 PM   Modules accepted: Orders

## 2013-03-13 ENCOUNTER — Ambulatory Visit (HOSPITAL_BASED_OUTPATIENT_CLINIC_OR_DEPARTMENT_OTHER): Payer: BC Managed Care – PPO

## 2013-03-13 ENCOUNTER — Other Ambulatory Visit (HOSPITAL_BASED_OUTPATIENT_CLINIC_OR_DEPARTMENT_OTHER): Payer: BC Managed Care – PPO

## 2013-03-13 VITALS — BP 155/98 | HR 80 | Temp 97.8°F

## 2013-03-13 DIAGNOSIS — C9000 Multiple myeloma not having achieved remission: Secondary | ICD-10-CM

## 2013-03-13 LAB — CBC WITH DIFFERENTIAL/PLATELET
BASO%: 2.8 % — ABNORMAL HIGH (ref 0.0–2.0)
BASOS ABS: 0.1 10*3/uL (ref 0.0–0.1)
EOS ABS: 0.2 10*3/uL (ref 0.0–0.5)
EOS%: 5 % (ref 0.0–7.0)
HEMATOCRIT: 42.7 % (ref 34.8–46.6)
HEMOGLOBIN: 14.3 g/dL (ref 11.6–15.9)
LYMPH%: 48.3 % (ref 14.0–49.7)
MCH: 29.8 pg (ref 25.1–34.0)
MCHC: 33.5 g/dL (ref 31.5–36.0)
MCV: 89 fL (ref 79.5–101.0)
MONO#: 0.4 10*3/uL (ref 0.1–0.9)
MONO%: 13.9 % (ref 0.0–14.0)
NEUT%: 30 % — ABNORMAL LOW (ref 38.4–76.8)
NEUTROS ABS: 1 10*3/uL — AB (ref 1.5–6.5)
PLATELETS: 207 10*3/uL (ref 145–400)
RBC: 4.8 10*6/uL (ref 3.70–5.45)
RDW: 14.6 % — ABNORMAL HIGH (ref 11.2–14.5)
WBC: 3.2 10*3/uL — AB (ref 3.9–10.3)
lymph#: 1.5 10*3/uL (ref 0.9–3.3)

## 2013-03-13 LAB — COMPREHENSIVE METABOLIC PANEL (CC13)
ALT: 16 U/L (ref 0–55)
ANION GAP: 12 meq/L — AB (ref 3–11)
AST: 19 U/L (ref 5–34)
Albumin: 3.5 g/dL (ref 3.5–5.0)
Alkaline Phosphatase: 74 U/L (ref 40–150)
BILIRUBIN TOTAL: 1.1 mg/dL (ref 0.20–1.20)
BUN: 11.4 mg/dL (ref 7.0–26.0)
CO2: 22 meq/L (ref 22–29)
Calcium: 9.3 mg/dL (ref 8.4–10.4)
Chloride: 107 mEq/L (ref 98–109)
Creatinine: 1 mg/dL (ref 0.6–1.1)
GLUCOSE: 90 mg/dL (ref 70–140)
Potassium: 3.9 mEq/L (ref 3.5–5.1)
Sodium: 141 mEq/L (ref 136–145)
TOTAL PROTEIN: 8 g/dL (ref 6.4–8.3)

## 2013-03-13 MED ORDER — ZOLEDRONIC ACID 4 MG/100ML IV SOLN
4.0000 mg | Freq: Once | INTRAVENOUS | Status: AC
  Administered 2013-03-13: 4 mg via INTRAVENOUS
  Filled 2013-03-13: qty 100

## 2013-03-13 MED ORDER — SODIUM CHLORIDE 0.9 % IV SOLN
Freq: Once | INTRAVENOUS | Status: AC
  Administered 2013-03-13: 11:00:00 via INTRAVENOUS

## 2013-03-13 NOTE — Patient Instructions (Signed)

## 2013-03-17 LAB — PROTEIN ELECTROPHORESIS, SERUM
ALBUMIN ELP: 47.7 % — AB (ref 55.8–66.1)
ALPHA-2-GLOBULIN: 11.9 % — AB (ref 7.1–11.8)
Alpha-1-Globulin: 4.3 % (ref 2.9–4.9)
Beta 2: 8.4 % — ABNORMAL HIGH (ref 3.2–6.5)
Beta Globulin: 6.1 % (ref 4.7–7.2)
Gamma Globulin: 21.6 % — ABNORMAL HIGH (ref 11.1–18.8)
TOTAL PROTEIN, SERUM ELECTROPHOR: 7.6 g/dL (ref 6.0–8.3)

## 2013-03-17 LAB — KAPPA/LAMBDA LIGHT CHAINS
KAPPA FREE LGHT CHN: 3.85 mg/dL — AB (ref 0.33–1.94)
Kappa:Lambda Ratio: 0.94 (ref 0.26–1.65)
Lambda Free Lght Chn: 4.08 mg/dL — ABNORMAL HIGH (ref 0.57–2.63)

## 2013-04-07 ENCOUNTER — Other Ambulatory Visit: Payer: Self-pay | Admitting: *Deleted

## 2013-04-07 DIAGNOSIS — C9000 Multiple myeloma not having achieved remission: Secondary | ICD-10-CM

## 2013-04-07 MED ORDER — LENALIDOMIDE 5 MG PO CAPS: 5.0000 mg | ORAL_CAPSULE | Freq: Every day | ORAL | Status: DC

## 2013-04-10 NOTE — Telephone Encounter (Signed)
RECEIVED A FAX FROM [facility]ERNING A CONFIRMATION OF PRESCRIPTION SHIPMENT FOR REVLIMID ON 04/09/13.

## 2013-05-05 ENCOUNTER — Other Ambulatory Visit: Payer: Self-pay | Admitting: *Deleted

## 2013-05-05 DIAGNOSIS — C9000 Multiple myeloma not having achieved remission: Secondary | ICD-10-CM

## 2013-05-05 MED ORDER — LENALIDOMIDE 5 MG PO CAPS: 5.0000 mg | ORAL_CAPSULE | Freq: Every day | ORAL | Status: DC

## 2013-05-12 NOTE — Telephone Encounter (Signed)
RECEIVED A FAX FROM [facility]ERNING A CONFIRMATION OF PRESCRIPTION SHIPMENT FOR REVLIMID ON 05/11/13.

## 2013-05-14 ENCOUNTER — Telehealth: Payer: Self-pay | Admitting: *Deleted

## 2013-05-14 NOTE — Telephone Encounter (Signed)
Per staff message I have moved 4/10 to 4/3

## 2013-05-18 ENCOUNTER — Telehealth: Payer: Self-pay | Admitting: Oncology

## 2013-05-18 NOTE — Telephone Encounter (Signed)
Gave pt appt for lab,md and chemo for April 2015

## 2013-06-05 ENCOUNTER — Other Ambulatory Visit: Payer: Self-pay | Admitting: *Deleted

## 2013-06-05 ENCOUNTER — Ambulatory Visit: Payer: BC Managed Care – PPO

## 2013-06-05 ENCOUNTER — Telehealth: Payer: Self-pay | Admitting: Oncology

## 2013-06-05 ENCOUNTER — Ambulatory Visit (HOSPITAL_BASED_OUTPATIENT_CLINIC_OR_DEPARTMENT_OTHER): Payer: BC Managed Care – PPO

## 2013-06-05 ENCOUNTER — Other Ambulatory Visit (HOSPITAL_BASED_OUTPATIENT_CLINIC_OR_DEPARTMENT_OTHER): Payer: BC Managed Care – PPO

## 2013-06-05 ENCOUNTER — Ambulatory Visit (HOSPITAL_BASED_OUTPATIENT_CLINIC_OR_DEPARTMENT_OTHER): Payer: BC Managed Care – PPO | Admitting: Oncology

## 2013-06-05 VITALS — BP 152/89 | HR 75 | Temp 97.8°F | Resp 19 | Ht 65.5 in | Wt 204.1 lb

## 2013-06-05 DIAGNOSIS — R000 Tachycardia, unspecified: Secondary | ICD-10-CM

## 2013-06-05 DIAGNOSIS — I10 Essential (primary) hypertension: Secondary | ICD-10-CM

## 2013-06-05 DIAGNOSIS — C9000 Multiple myeloma not having achieved remission: Secondary | ICD-10-CM

## 2013-06-05 DIAGNOSIS — D709 Neutropenia, unspecified: Secondary | ICD-10-CM

## 2013-06-05 LAB — COMPREHENSIVE METABOLIC PANEL (CC13)
ALT: 19 U/L (ref 0–55)
ANION GAP: 11 meq/L (ref 3–11)
AST: 20 U/L (ref 5–34)
Albumin: 3.5 g/dL (ref 3.5–5.0)
Alkaline Phosphatase: 76 U/L (ref 40–150)
BILIRUBIN TOTAL: 0.71 mg/dL (ref 0.20–1.20)
BUN: 13.5 mg/dL (ref 7.0–26.0)
CO2: 23 meq/L (ref 22–29)
CREATININE: 1 mg/dL (ref 0.6–1.1)
Calcium: 9.3 mg/dL (ref 8.4–10.4)
Chloride: 108 mEq/L (ref 98–109)
GLUCOSE: 89 mg/dL (ref 70–140)
Potassium: 3.6 mEq/L (ref 3.5–5.1)
Sodium: 142 mEq/L (ref 136–145)
Total Protein: 8.2 g/dL (ref 6.4–8.3)

## 2013-06-05 LAB — CBC WITH DIFFERENTIAL/PLATELET
BASO%: 1.4 % (ref 0.0–2.0)
BASOS ABS: 0 10*3/uL (ref 0.0–0.1)
EOS ABS: 0.1 10*3/uL (ref 0.0–0.5)
EOS%: 4.3 % (ref 0.0–7.0)
HEMATOCRIT: 41.4 % (ref 34.8–46.6)
HEMOGLOBIN: 13.5 g/dL (ref 11.6–15.9)
LYMPH#: 1.3 10*3/uL (ref 0.9–3.3)
LYMPH%: 42.1 % (ref 14.0–49.7)
MCH: 30 pg (ref 25.1–34.0)
MCHC: 32.8 g/dL (ref 31.5–36.0)
MCV: 91.5 fL (ref 79.5–101.0)
MONO#: 0.4 10*3/uL (ref 0.1–0.9)
MONO%: 13.6 % (ref 0.0–14.0)
NEUT%: 38.6 % (ref 38.4–76.8)
NEUTROS ABS: 1.2 10*3/uL — AB (ref 1.5–6.5)
PLATELETS: 232 10*3/uL (ref 145–400)
RBC: 4.52 10*6/uL (ref 3.70–5.45)
RDW: 15.1 % — ABNORMAL HIGH (ref 11.2–14.5)
WBC: 3.1 10*3/uL — AB (ref 3.9–10.3)

## 2013-06-05 MED ORDER — ZOLEDRONIC ACID 4 MG/5ML IV CONC
4.0000 mg | Freq: Once | INTRAVENOUS | Status: AC
  Administered 2013-06-05: 4 mg via INTRAVENOUS
  Filled 2013-06-05: qty 5

## 2013-06-05 MED ORDER — SODIUM CHLORIDE 0.9 % IV SOLN
Freq: Once | INTRAVENOUS | Status: AC
  Administered 2013-06-05: 14:00:00 via INTRAVENOUS

## 2013-06-05 NOTE — Progress Notes (Signed)
  [place] OFFICE PROGRESS NOTE   Diagnosis: Multiple myeloma  INTERVAL HISTORY:   [name] continues maintenance Revlimid. She feels well. No specific complaint. No pain. No jaw pain. She is maintained on every 3 month Zometa. No recent infection. She received an influenza vaccine in 2014.  Objective:  Vital signs in last 24 hours:  Blood pressure 152/89, pulse 75, temperature 97.8 F (36.6 C), temperature source Oral, resp. rate 19, height 5' 5.5" (1.664 m), weight 204 lb 1.6 oz (92.579 kg).    HEENT: No thrush Resp: Lungs clear bilateral Cardio: Regular rate and rhythm GI: No hepatosplenic Vascular: No leg edema, the left lower leg is slightly larger than the right side   Lab Results:  Lab Results  Component Value Date   WBC 3.1* 06/05/2013   HGB 13.5 06/05/2013   HCT 41.4 06/05/2013   MCV 91.5 06/05/2013   PLT 232 06/05/2013   NEUTROABS 1.2* 06/05/2013   potassium 3.6, creatinine 1.0, calcium 9.3, albumin 3.5  03/13/2013-serum M spike not detected, kappa free light chains 3.85, lambda free light chains 4.0 a  Medications: I have reviewed the patient's current medications.  Assessment/Plan: 1. Multiple myeloma, IgG kappa, status post 7 cycles of Revlimid/Decadron and Velcade with marked clinical and laboratory improvement. She is status post melphalan conditioning, followed by an autologous stem cell infusion 09/10/2008. She began maintenance Revlimid 01/04/2009. The serum M-spike was not detected on an SPEP in January 2015. She continues maintenance Revlimid  2. Neutropenia while on Revlimid at a dose of 10 mg daily. The Revlimid dose was reduced to 5 mg in September 2011 due to persistent neutropenia and a hospital admission with pneumonia/sepsis. Mild neutropenia persists. 3. Admission 11/03/2009 with left lung pneumonia and sepsis syndrome. No specific organism was cultured during the hospital admission. She completed outpatient Levaquin therapy.  4. History  of anemia secondary to multiple myeloma, status post a red cell transfusion in September 2009.  5. Thoracic spine compression fracture, status post a kyphoplasty 12/05/2007. A metastatic bone survey was unchanged on 06/11/2012. 6. Tiny pulmonary embolism on a CT 11/20/2007, now maintained off of Coumadin.  7. Chronic tachycardia on repeat office visits at the [facility]  8. History of hypertension. She reports the blood pressure is lower when checked at home   Disposition:  Carrie Huber remains in clinical remission from multiple myeloma. We will followup on the serum protein electrophoresis and light chains from today. She will be scheduled for a metastatic bone survey. We will arrange for her to receive the newer pneumococcal vaccine. She continues every 3 month Zometa. [name] will return for an office visit in 6 months.  Betsy Coder, MD  06/05/2013  1:53 PM

## 2013-06-05 NOTE — Progress Notes (Signed)
Per Dr. Benay Spice : OK to give Zometa based on Cmet from January 2015.

## 2013-06-05 NOTE — Telephone Encounter (Signed)
Unknown Jim and she will add bone survey

## 2013-06-05 NOTE — Telephone Encounter (Signed)
pt came back and need to r/s 7.3 appts to 7.10.Carrie KitchenMarland Kitchenpt pt new appt

## 2013-06-05 NOTE — Telephone Encounter (Signed)
gv adn printed apts ched and avs for pt for July and OCT

## 2013-06-05 NOTE — Telephone Encounter (Signed)
THIS REFILL REQUEST WAS GIVEN TO DR.SHERRILL'S NURSE, SUSAN COWARD,RN.

## 2013-06-09 ENCOUNTER — Other Ambulatory Visit: Payer: Self-pay | Admitting: *Deleted

## 2013-06-09 LAB — KAPPA/LAMBDA LIGHT CHAINS
KAPPA FREE LGHT CHN: 5.24 mg/dL — AB (ref 0.33–1.94)
Kappa:Lambda Ratio: 1.15 (ref 0.26–1.65)
Lambda Free Lght Chn: 4.57 mg/dL — ABNORMAL HIGH (ref 0.57–2.63)

## 2013-06-09 LAB — PROTEIN ELECTROPHORESIS, SERUM
ALPHA-1-GLOBULIN: 4.5 % (ref 2.9–4.9)
Albumin ELP: 47.6 % — ABNORMAL LOW (ref 55.8–66.1)
Alpha-2-Globulin: 12.6 % — ABNORMAL HIGH (ref 7.1–11.8)
Beta 2: 8.1 % — ABNORMAL HIGH (ref 3.2–6.5)
Beta Globulin: 6 % (ref 4.7–7.2)
GAMMA GLOBULIN: 21.2 % — AB (ref 11.1–18.8)
TOTAL PROTEIN, SERUM ELECTROPHOR: 7.5 g/dL (ref 6.0–8.3)

## 2013-06-09 NOTE — Telephone Encounter (Signed)
Fax request from [facility] for Revlimid given to Dr. Benay Spice.

## 2013-06-10 ENCOUNTER — Other Ambulatory Visit: Payer: Self-pay | Admitting: *Deleted

## 2013-06-10 DIAGNOSIS — C9000 Multiple myeloma not having achieved remission: Secondary | ICD-10-CM

## 2013-06-10 MED ORDER — LENALIDOMIDE 5 MG PO CAPS: 5.0000 mg | ORAL_CAPSULE | Freq: Every day | ORAL | Status: DC

## 2013-06-10 NOTE — Telephone Encounter (Signed)
[facility] faxed confirmation of facsimile receipt for Revlimid prescription referral.  [facility] will verify insurance and make delivery arrangements with patient. 

## 2013-06-11 NOTE — Telephone Encounter (Signed)
RECEIVED A FAX FROM [facility] CONCERNING A CONFIRMATION OF PRESCRIPTION SHIPMENT FOR REVLIMID ON 06/10/13.

## 2013-06-12 ENCOUNTER — Other Ambulatory Visit: Payer: BC Managed Care – PPO

## 2013-06-12 ENCOUNTER — Ambulatory Visit: Payer: BC Managed Care – PPO | Admitting: Oncology

## 2013-06-12 ENCOUNTER — Ambulatory Visit: Payer: BC Managed Care – PPO

## 2013-06-18 ENCOUNTER — Encounter: Payer: Self-pay | Admitting: Pharmacist

## 2013-06-18 NOTE — Progress Notes (Signed)
Carrie Horton, RN asked by Dr. Benay Spice to determine if pt needed Pneumovax 23 vaccine at next visit. I was able to find in [place] that pt had Pneumovax 23 on 12/09/09. Per current CDC recommendations, her next Pneumovax is due at least 5 years after the most recent dose of PPSV23==> 12/2014 or after. I noted this in pts pharmacy profile. Carrie Huber, Pharm.D., CPP 06/18/2013@12 :02 PM

## 2013-07-02 ENCOUNTER — Other Ambulatory Visit: Payer: Self-pay | Admitting: *Deleted

## 2013-07-02 NOTE — Telephone Encounter (Signed)
THIS REFILL REQUEST FOR REVLIMID WAS PLACED ON DR.SHERRILL'S DESK.

## 2013-07-03 ENCOUNTER — Other Ambulatory Visit: Payer: Self-pay | Admitting: *Deleted

## 2013-07-03 DIAGNOSIS — C9000 Multiple myeloma not having achieved remission: Secondary | ICD-10-CM

## 2013-07-03 MED ORDER — LENALIDOMIDE 5 MG PO CAPS: 5.0000 mg | ORAL_CAPSULE | Freq: Every day | ORAL | Status: DC

## 2013-07-03 NOTE — Telephone Encounter (Signed)
RECEIVED A FAX FROM [facility] CONCERNING A CONFIRMATION OF FACSIMILE RECEIPT FOR PT. REFERRAL. 

## 2013-07-07 NOTE — Telephone Encounter (Signed)
RECEIVED A FAX FROM [facility] CONCERNING A CONFIRMATION OF PRESCRIPTION SHIPMENT FOR REVLIMID ON 07/06/13.

## 2013-07-30 ENCOUNTER — Other Ambulatory Visit: Payer: Self-pay | Admitting: *Deleted

## 2013-07-30 NOTE — Telephone Encounter (Signed)
Refill request for Revlimid to MD desk.

## 2013-07-31 ENCOUNTER — Other Ambulatory Visit: Payer: Self-pay | Admitting: *Deleted

## 2013-07-31 DIAGNOSIS — C9000 Multiple myeloma not having achieved remission: Secondary | ICD-10-CM

## 2013-07-31 MED ORDER — LENALIDOMIDE 5 MG PO CAPS: 5.0000 mg | ORAL_CAPSULE | Freq: Every day | ORAL | Status: DC

## 2013-07-31 NOTE — Telephone Encounter (Signed)
Script for revlimid faxed to [facility] auth # [id number].

## 2013-08-05 NOTE — Telephone Encounter (Signed)
Received confirmation of revlimid shipment on 08/04/13 for delivery next business day from [facility].

## 2013-08-26 ENCOUNTER — Other Ambulatory Visit: Payer: Self-pay | Admitting: *Deleted

## 2013-08-26 DIAGNOSIS — C9000 Multiple myeloma not having achieved remission: Secondary | ICD-10-CM

## 2013-08-26 MED ORDER — LENALIDOMIDE 5 MG PO CAPS: 5.0000 mg | ORAL_CAPSULE | Freq: Every day | ORAL | Status: DC

## 2013-08-26 NOTE — Addendum Note (Signed)
Addended by: Tania Ade on: 08/26/2013 12:40 PM   Modules accepted: Orders

## 2013-08-26 NOTE — Telephone Encounter (Signed)
Revlimid refill request to MD desk for approval

## 2013-08-27 NOTE — Telephone Encounter (Signed)
RECEIVED A FAX FROM [facility] CONCERNING A CONFIRMATION OF FACSIMILE RECEIPT FOR PT. REFERRAL. 

## 2013-09-03 NOTE — Telephone Encounter (Signed)
RECEIVED A FAX FROM [facility] CON[facility]ERNING A CONFIRMATION OF PRESCRIPTION SHIPMENT FOR REVLIMID ON 09/02/13.

## 2013-09-04 ENCOUNTER — Other Ambulatory Visit: Payer: BC Managed Care – PPO

## 2013-09-04 ENCOUNTER — Ambulatory Visit: Payer: BC Managed Care – PPO

## 2013-09-09 ENCOUNTER — Telehealth: Payer: Self-pay | Admitting: *Deleted

## 2013-09-09 NOTE — Telephone Encounter (Signed)
Patient called and moved her appt from 7/10 to 7/23

## 2013-09-11 ENCOUNTER — Other Ambulatory Visit: Payer: BC Managed Care – PPO

## 2013-09-11 ENCOUNTER — Ambulatory Visit: Payer: BC Managed Care – PPO

## 2013-09-18 ENCOUNTER — Telehealth: Payer: Self-pay | Admitting: *Deleted

## 2013-09-18 ENCOUNTER — Telehealth: Payer: Self-pay | Admitting: Oncology

## 2013-09-18 NOTE — Telephone Encounter (Signed)
Per [facility] staff message scheduled appts. Advised scheduler 

## 2013-09-18 NOTE — Telephone Encounter (Signed)
Pt called to cancel her 07/23 labs/chemo for the 2nd time, I sent pt to [id number]  vm and I msg  Sharyn Lull and Telford Nab letting them know she is trying to cancel apt.[name]..KJ

## 2013-09-23 ENCOUNTER — Telehealth: Payer: Self-pay | Admitting: *Deleted

## 2013-09-23 NOTE — Telephone Encounter (Signed)
Try to call patient, unable to leave voicemail

## 2013-09-23 NOTE — Telephone Encounter (Signed)
Per. Patient request called to reschedule appt.

## 2013-09-24 ENCOUNTER — Other Ambulatory Visit: Payer: Self-pay | Admitting: *Deleted

## 2013-09-24 ENCOUNTER — Other Ambulatory Visit: Payer: BC Managed Care – PPO

## 2013-09-24 ENCOUNTER — Ambulatory Visit: Payer: BC Managed Care – PPO

## 2013-09-24 DIAGNOSIS — C9000 Multiple myeloma not having achieved remission: Secondary | ICD-10-CM

## 2013-09-24 MED ORDER — LENALIDOMIDE 5 MG PO CAPS: 5.0000 mg | ORAL_CAPSULE | Freq: Every day | ORAL | Status: DC

## 2013-09-24 NOTE — Addendum Note (Signed)
Addended by: Wyonia Hough on: 09/24/2013 02:39 PM   Modules accepted: Orders

## 2013-09-24 NOTE — Telephone Encounter (Signed)
THIS REFILL REQUEST FOR REVLIMID WAS PLACED ON DR.SHERRILL'S DESK.

## 2013-09-25 NOTE — Telephone Encounter (Signed)
RECEIVED A FAX FROM [facility] CONCERNING A CONFIRMATION OF FACSIMILE RECEIPT FOR PT. REFERRAL. 

## 2013-09-29 ENCOUNTER — Telehealth: Payer: Self-pay | Admitting: *Deleted

## 2013-09-29 NOTE — Telephone Encounter (Signed)
Per pt. Request to have appt. resheduled to 8/3

## 2013-09-30 NOTE — Telephone Encounter (Signed)
RECEIVED A FAX FROM [facility] CONCERNING A CONFIRMATION OF PRESCRIPTION SHIPMENT FOR REVLIMID ON 09/29/13.

## 2013-10-01 ENCOUNTER — Ambulatory Visit: Payer: BC Managed Care – PPO

## 2013-10-01 ENCOUNTER — Other Ambulatory Visit: Payer: BC Managed Care – PPO

## 2013-10-05 ENCOUNTER — Other Ambulatory Visit: Payer: BC Managed Care – PPO

## 2013-10-05 ENCOUNTER — Ambulatory Visit (HOSPITAL_BASED_OUTPATIENT_CLINIC_OR_DEPARTMENT_OTHER): Payer: BC Managed Care – PPO

## 2013-10-05 ENCOUNTER — Other Ambulatory Visit (HOSPITAL_BASED_OUTPATIENT_CLINIC_OR_DEPARTMENT_OTHER): Payer: BC Managed Care – PPO

## 2013-10-05 ENCOUNTER — Ambulatory Visit: Payer: BC Managed Care – PPO

## 2013-10-05 VITALS — BP 145/83 | HR 73 | Temp 98.7°F | Resp 16

## 2013-10-05 DIAGNOSIS — C9000 Multiple myeloma not having achieved remission: Secondary | ICD-10-CM

## 2013-10-05 LAB — CBC WITH DIFFERENTIAL/PLATELET
BASO%: 1.4 % (ref 0.0–2.0)
BASOS ABS: 0 10*3/uL (ref 0.0–0.1)
EOS%: 5.9 % (ref 0.0–7.0)
Eosinophils Absolute: 0.2 10*3/uL (ref 0.0–0.5)
HEMATOCRIT: 38.5 % (ref 34.8–46.6)
HEMOGLOBIN: 12.9 g/dL (ref 11.6–15.9)
LYMPH#: 1.5 10*3/uL (ref 0.9–3.3)
LYMPH%: 51 % — ABNORMAL HIGH (ref 14.0–49.7)
MCH: 29.9 pg (ref 25.1–34.0)
MCHC: 33.5 g/dL (ref 31.5–36.0)
MCV: 89.1 fL (ref 79.5–101.0)
MONO#: 0.4 10*3/uL (ref 0.1–0.9)
MONO%: 12.9 % (ref 0.0–14.0)
NEUT%: 28.8 % — AB (ref 38.4–76.8)
NEUTROS ABS: 0.8 10*3/uL — AB (ref 1.5–6.5)
Platelets: 213 10*3/uL (ref 145–400)
RBC: 4.32 10*6/uL (ref 3.70–5.45)
RDW: 14.6 % — AB (ref 11.2–14.5)
WBC: 2.9 10*3/uL — AB (ref 3.9–10.3)

## 2013-10-05 LAB — COMPREHENSIVE METABOLIC PANEL (CC13)
ALBUMIN: 3.2 g/dL — AB (ref 3.5–5.0)
ALT: 15 U/L (ref 0–55)
ANION GAP: 8 meq/L (ref 3–11)
AST: 19 U/L (ref 5–34)
Alkaline Phosphatase: 65 U/L (ref 40–150)
BUN: 13.4 mg/dL (ref 7.0–26.0)
CALCIUM: 9.2 mg/dL (ref 8.4–10.4)
CHLORIDE: 108 meq/L (ref 98–109)
CO2: 24 meq/L (ref 22–29)
Creatinine: 0.9 mg/dL (ref 0.6–1.1)
GLUCOSE: 71 mg/dL (ref 70–140)
Potassium: 3.8 mEq/L (ref 3.5–5.1)
SODIUM: 140 meq/L (ref 136–145)
TOTAL PROTEIN: 7.8 g/dL (ref 6.4–8.3)
Total Bilirubin: 0.47 mg/dL (ref 0.20–1.20)

## 2013-10-05 MED ORDER — ZOLEDRONIC ACID 4 MG/100ML IV SOLN
4.0000 mg | Freq: Once | INTRAVENOUS | Status: AC
  Administered 2013-10-05: 4 mg via INTRAVENOUS
  Filled 2013-10-05: qty 100

## 2013-10-05 MED ORDER — SODIUM CHLORIDE 0.9 % IJ SOLN
10.0000 mL | INTRAMUSCULAR | Status: DC | PRN
  Filled 2013-10-05: qty 10

## 2013-10-05 MED ORDER — SODIUM CHLORIDE 0.9 % IV SOLN
Freq: Once | INTRAVENOUS | Status: AC
  Administered 2013-10-05: 15:00:00 via INTRAVENOUS

## 2013-10-05 NOTE — Patient Instructions (Signed)
Lenalidomide Oral Capsules What is this medicine? LENALIDOMIDE (len a LID oh mide) is a chemotherapy drug that targets specific proteins within cancer cells and stops the cancer cell from growing. It is used to treat multiple myeloma, mantle cell lymphoma, and some myelodysplastic syndromes that cause severe anemia requiring blood transfusions. This medicine may be used for other purposes; ask your health care provider or pharmacist if you have questions. COMMON BRAND NAME(S): Revlimid What should I tell my health care provider before I take this medicine? They need to know if you have any of these conditions: -blood clots in the legs or the lungs -high blood pressure -high cholesterol -infection -irregular monthly periods or menstrual cycles -kidney disease -liver disease -smoke tobacco -thyroid disease -an unusual or allergic reaction to lenalidomide, other medicines, foods, dyes, or preservatives -pregnant or trying to get pregnant -breast-feeding How should I use this medicine? Take this medicine by mouth with a glass of water. Follow the directions on the prescription label. Do not cut, crush, or chew this medicine. Take your medicine at regular intervals. Do not take it more often than directed. Do not stop taking except on your doctor's advice. A MedGuide will be given with each prescription and refill. Read this guide carefully each time. The MedGuide may change frequently. Talk to your pediatrician regarding the use of this medicine in children. Special care may be needed. Overdosage: If you think you have taken too much of this medicine contact a poison control center or emergency room at once. NOTE: This medicine is only for you. Do not share this medicine with others. What if I miss a dose? If you miss a dose, take it as soon as you can. If your next dose is to be taken in less than 12 hours, then do not take the missed dose. Take the next dose at your regular time. Do not take  double or extra doses. What may interact with this medicine? This medicine may interact with the following medications: -digoxin -medicines that increase the risk of thrombosis like estrogens or erythropoietic agents (e.g., epoetin alfa and darbepoetin alfa) -warfarin This list may not describe all possible interactions. Give your health care provider a list of all the medicines, herbs, non-prescription drugs, or dietary supplements you use. Also tell them if you smoke, drink alcohol, or use illegal drugs. Some items may interact with your medicine. What should I watch for while using this medicine? Visit your doctor for regular check ups. Tell your doctor or healthcare professional if your symptoms do not start to get better or if they get worse. You will need to have important blood work done while you are taking this medicine. This medicine is available only through a special program. Doctors, pharmacies, and patients must meet all of the conditions of the program. Your health care provider will help you get signed up with the program if you need this medicine. Through the program you will only receive up to a 28 day supply of the medicine at one time. You will need a new prescription for each refill. This medicine can cause birth defects. Do not get pregnant while taking this drug. Females with child-bearing potential will need to have 2 negative pregnancy tests before starting this medicine. Pregnancy testing must be done every 2 to 4 weeks as directed while taking this medicine. Use 2 reliable forms of birth control together while you are taking this medicine and for 1 month after you stop taking this medicine. If you  think that you might be pregnant talk to your doctor right away. Men must use a latex condom during sexual contact with a woman while taking this medicine and for 28 days after you stop taking this medicine. A latex condom is needed even if you have had a vasectomy. Contact your doctor  right away if your partner becomes pregnant. Do not donate sperm while taking this medicine and for 28 days after you stop taking this medicine. Do not give blood while taking the medicine and for 1 month after completion of treatment to avoid exposing pregnant women to the medicine through the donated blood. Talk to your doctor about your risk of cancer. You may be more at risk for certain types of cancers if you take this medicine. What side effects may I notice from receiving this medicine? Side effects that you should report to your doctor or health care professional as soon as possible: -allergic reactions like skin rash, itching or hives, swelling of the face, lips, or tongue -breathing problems -chest pain or tightness -fast, irregular heartbeat -low blood counts - this medicine may decrease the number of white blood cells, red blood cells and platelets. You may be at increased risk for infections and bleeding. -seizures -signs and symptoms of bleeding such as bloody or black, tarry stools; red or dark-brown urine; spitting up blood or brown material that looks like coffee grounds; red spots on the skin; unusual bruising or bleeding from the eye, gums, or nose -signs and symptoms of a blood clot such as breathing problems; changes in vision; chest pain; severe, sudden headache; pain, swelling, warmth in the leg; trouble speaking; sudden numbness or weakness of the face, arm or leg -signs and symptoms of liver injury like dark yellow or brown urine; general ill feeling or flu-like symptoms; light-colored stools; loss of appetite; nausea; right upper belly pain; unusually weak or tired; yellowing of the eyes or skin -signs and symptoms of a stroke like changes in vision; confusion; trouble speaking or understanding; severe headaches; sudden numbness or weakness of the face, arm or leg; trouble walking; dizziness; loss of balance or coordination -sweating -vomiting Side effects that usually do  not require medical attention (report to your doctor or health care professional if they continue or are bothersome): -constipation -cough -diarrhea -tiredness This list may not describe all possible side effects. Call your doctor for medical advice about side effects. You may report side effects to FDA at 1-800-FDA-1088. Where should I keep my medicine? Keep out of the reach of children. Store at room temperature between 15 and 30 degrees C (59 and 86 degrees F). Throw away any unused medicine after the expiration date. NOTE: This sheet is a summary. It may not cover all possible information. If you have questions about this medicine, talk to your doctor, pharmacist, or health care provider.  2015, Elsevier/Gold Standard. (2013-05-26 18:30:01) Zoledronic Acid injection (Hypercalcemia, Oncology) What is this medicine? ZOLEDRONIC ACID (ZOE le dron ik AS id) lowers the amount of calcium loss from bone. It is used to treat too much calcium in your blood from cancer. It is also used to prevent complications of cancer that has spread to the bone. This medicine may be used for other purposes; ask your health care provider or pharmacist if you have questions. COMMON BRAND NAME(S): Zometa What should I tell my health care provider before I take this medicine? They need to know if you have any of these conditions: -aspirin-sensitive asthma -cancer, especially if you  are receiving medicines used to treat cancer -dental disease or wear dentures -infection -kidney disease -receiving corticosteroids like dexamethasone or prednisone -an unusual or allergic reaction to zoledronic acid, other medicines, foods, dyes, or preservatives -pregnant or trying to get pregnant -breast-feeding How should I use this medicine? This medicine is for infusion into a vein. It is given by a health care professional in a hospital or clinic setting. Talk to your pediatrician regarding the use of this medicine in  children. Special care may be needed. Overdosage: If you think you have taken too much of this medicine contact a poison control center or emergency room at once. NOTE: This medicine is only for you. Do not share this medicine with others. What if I miss a dose? It is important not to miss your dose. Call your doctor or health care professional if you are unable to keep an appointment. What may interact with this medicine? -certain antibiotics given by injection -NSAIDs, medicines for pain and inflammation, like ibuprofen or naproxen -some diuretics like bumetanide, furosemide -teriparatide -thalidomide This list may not describe all possible interactions. Give your health care provider a list of all the medicines, herbs, non-prescription drugs, or dietary supplements you use. Also tell them if you smoke, drink alcohol, or use illegal drugs. Some items may interact with your medicine. What should I watch for while using this medicine? Visit your doctor or health care professional for regular checkups. It may be some time before you see the benefit from this medicine. Do not stop taking your medicine unless your doctor tells you to. Your doctor may order blood tests or other tests to see how you are doing. Women should inform their doctor if they wish to become pregnant or think they might be pregnant. There is a potential for serious side effects to an unborn child. Talk to your health care professional or pharmacist for more information. You should make sure that you get enough calcium and vitamin D while you are taking this medicine. Discuss the foods you eat and the vitamins you take with your health care professional. Some people who take this medicine have severe bone, joint, and/or muscle pain. This medicine may also increase your risk for jaw problems or a broken thigh bone. Tell your doctor right away if you have severe pain in your jaw, bones, joints, or muscles. Tell your doctor if you have  any pain that does not go away or that gets worse. Tell your dentist and dental surgeon that you are taking this medicine. You should not have major dental surgery while on this medicine. See your dentist to have a dental exam and fix any dental problems before starting this medicine. Take good care of your teeth while on this medicine. Make sure you see your dentist for regular follow-up appointments. What side effects may I notice from receiving this medicine? Side effects that you should report to your doctor or health care professional as soon as possible: -allergic reactions like skin rash, itching or hives, swelling of the face, lips, or tongue -anxiety, confusion, or depression -breathing problems -changes in vision -eye pain -feeling faint or lightheaded, falls -jaw pain, especially after dental work -mouth sores -muscle cramps, stiffness, or weakness -trouble passing urine or change in the amount of urine Side effects that usually do not require medical attention (report to your doctor or health care professional if they continue or are bothersome): -bone, joint, or muscle pain -constipation -diarrhea -fever -hair loss -irritation at site where  injected -loss of appetite -nausea, vomiting -stomach upset -trouble sleeping -trouble swallowing -weak or tired This list may not describe all possible side effects. Call your doctor for medical advice about side effects. You may report side effects to FDA at 1-800-FDA-1088. Where should I keep my medicine? This drug is given in a hospital or clinic and will not be stored at home. NOTE: This sheet is a summary. It may not cover all possible information. If you have questions about this medicine, talk to your doctor, pharmacist, or health care provider.  2015, Elsevier/Gold Standard. (2012-07-31 13:03:13)

## 2013-10-05 NOTE — Progress Notes (Signed)
ANC 0.8 today. Ned Card, NP notified. Per NP, if any changes are to be made to patient's treatment regimen, she will contact patient. RN informed patient with verbal understanding.

## 2013-10-07 LAB — PROTEIN ELECTROPHORESIS, SERUM
Albumin ELP: 47.8 % — ABNORMAL LOW (ref 55.8–66.1)
Alpha-1-Globulin: 4.3 % (ref 2.9–4.9)
Alpha-2-Globulin: 12 % — ABNORMAL HIGH (ref 7.1–11.8)
Beta 2: 7.3 % — ABNORMAL HIGH (ref 3.2–6.5)
Beta Globulin: 6.2 % (ref 4.7–7.2)
GAMMA GLOBULIN: 22.4 % — AB (ref 11.1–18.8)
Total Protein, Serum Electrophoresis: 7.3 g/dL (ref 6.0–8.3)

## 2013-10-07 LAB — KAPPA/LAMBDA LIGHT CHAINS
KAPPA FREE LGHT CHN: 5.69 mg/dL — AB (ref 0.33–1.94)
Kappa:Lambda Ratio: 1.25 (ref 0.26–1.65)
Lambda Free Lght Chn: 4.54 mg/dL — ABNORMAL HIGH (ref 0.57–2.63)

## 2013-10-23 ENCOUNTER — Other Ambulatory Visit: Payer: Self-pay | Admitting: *Deleted

## 2013-10-23 DIAGNOSIS — C9000 Multiple myeloma not having achieved remission: Secondary | ICD-10-CM

## 2013-10-23 NOTE — Telephone Encounter (Signed)
THIS REFILL REQUEST FOR REVLIMID WAS PLACED ON DR.SHERRILL'S DESK.

## 2013-10-26 MED ORDER — LENALIDOMIDE 5 MG PO CAPS: 5.0000 mg | ORAL_CAPSULE | Freq: Every day | ORAL | Status: DC

## 2013-10-26 NOTE — Addendum Note (Signed)
Addended by: Wyonia Hough on: 10/26/2013 09:55 AM   Modules accepted: Orders

## 2013-11-16 ENCOUNTER — Telehealth: Payer: Self-pay | Admitting: Oncology

## 2013-11-16 NOTE — Telephone Encounter (Signed)
10/2 appt moved to LT due to schedule change for BS. lmonvm for pt re new time and mailed schedule.

## 2013-11-20 ENCOUNTER — Other Ambulatory Visit: Payer: Self-pay | Admitting: *Deleted

## 2013-11-20 DIAGNOSIS — C9000 Multiple myeloma not having achieved remission: Secondary | ICD-10-CM

## 2013-11-20 MED ORDER — LENALIDOMIDE 5 MG PO CAPS: 5.0000 mg | ORAL_CAPSULE | Freq: Every day | ORAL | Status: DC

## 2013-11-20 NOTE — Telephone Encounter (Signed)
THIS REFILL REQUEST FOR REVLIMID WAS PLACED ON DR.SHERRILL'S DESK. 

## 2013-11-23 ENCOUNTER — Encounter: Payer: Self-pay | Admitting: *Deleted

## 2013-12-01 ENCOUNTER — Ambulatory Visit (HOSPITAL_COMMUNITY)
Admission: RE | Admit: 2013-12-01 | Discharge: 2013-12-01 | Disposition: A | Discharge status: Home / Self Care | Payer: BC Managed Care – PPO | Source: Ambulatory Visit | Attending: Oncology | Admitting: Oncology

## 2013-12-01 DIAGNOSIS — C9000 Multiple myeloma not having achieved remission: Secondary | ICD-10-CM | POA: Diagnosis present

## 2013-12-01 DIAGNOSIS — Z9481 Bone marrow transplant status: Secondary | ICD-10-CM | POA: Diagnosis not present

## 2013-12-02 ENCOUNTER — Telehealth: Payer: Self-pay | Admitting: *Deleted

## 2013-12-02 NOTE — Telephone Encounter (Signed)
Patient called and moved her infusion appt on 10/2 to 10/7

## 2013-12-04 ENCOUNTER — Ambulatory Visit: Payer: BC Managed Care – PPO

## 2013-12-04 ENCOUNTER — Other Ambulatory Visit: Payer: BC Managed Care – PPO

## 2013-12-04 ENCOUNTER — Ambulatory Visit: Payer: BC Managed Care – PPO | Admitting: Nurse Practitioner

## 2013-12-09 ENCOUNTER — Ambulatory Visit (HOSPITAL_BASED_OUTPATIENT_CLINIC_OR_DEPARTMENT_OTHER): Payer: BC Managed Care – PPO

## 2013-12-09 ENCOUNTER — Ambulatory Visit (HOSPITAL_BASED_OUTPATIENT_CLINIC_OR_DEPARTMENT_OTHER): Payer: BC Managed Care – PPO | Admitting: Nurse Practitioner

## 2013-12-09 ENCOUNTER — Telehealth: Payer: Self-pay | Admitting: Oncology

## 2013-12-09 VITALS — BP 143/83 | HR 77 | Temp 98.2°F | Resp 19 | Ht 65.5 in | Wt 203.4 lb

## 2013-12-09 DIAGNOSIS — C9000 Multiple myeloma not having achieved remission: Secondary | ICD-10-CM

## 2013-12-09 DIAGNOSIS — Z23 Encounter for immunization: Secondary | ICD-10-CM

## 2013-12-09 DIAGNOSIS — Z86711 Personal history of pulmonary embolism: Secondary | ICD-10-CM

## 2013-12-09 DIAGNOSIS — I10 Essential (primary) hypertension: Secondary | ICD-10-CM

## 2013-12-09 DIAGNOSIS — D702 Other drug-induced agranulocytosis: Secondary | ICD-10-CM

## 2013-12-09 DIAGNOSIS — R000 Tachycardia, unspecified: Secondary | ICD-10-CM

## 2013-12-09 LAB — CBC WITH DIFFERENTIAL/PLATELET
BASO%: 1.8 % (ref 0.0–2.0)
BASOS ABS: 0.1 10*3/uL (ref 0.0–0.1)
EOS%: 5.1 % (ref 0.0–7.0)
Eosinophils Absolute: 0.1 10*3/uL (ref 0.0–0.5)
HEMATOCRIT: 40.7 % (ref 34.8–46.6)
HEMOGLOBIN: 13.6 g/dL (ref 11.6–15.9)
LYMPH%: 47.4 % (ref 14.0–49.7)
MCH: 29.6 pg (ref 25.1–34.0)
MCHC: 33.4 g/dL (ref 31.5–36.0)
MCV: 88.7 fL (ref 79.5–101.0)
MONO#: 0.4 10*3/uL (ref 0.1–0.9)
MONO%: 15.4 % — ABNORMAL HIGH (ref 0.0–14.0)
NEUT%: 30.3 % — AB (ref 38.4–76.8)
NEUTROS ABS: 0.8 10*3/uL — AB (ref 1.5–6.5)
Platelets: 211 10*3/uL (ref 145–400)
RBC: 4.59 10*6/uL (ref 3.70–5.45)
RDW: 14.8 % — ABNORMAL HIGH (ref 11.2–14.5)
WBC: 2.7 10*3/uL — ABNORMAL LOW (ref 3.9–10.3)
lymph#: 1.3 10*3/uL (ref 0.9–3.3)
nRBC: 0 % (ref 0–0)

## 2013-12-09 LAB — COMPREHENSIVE METABOLIC PANEL (CC13)
ALBUMIN: 3.4 g/dL — AB (ref 3.5–5.0)
ALT: 21 U/L (ref 0–55)
AST: 22 U/L (ref 5–34)
Alkaline Phosphatase: 70 U/L (ref 40–150)
Anion Gap: 8 mEq/L (ref 3–11)
BILIRUBIN TOTAL: 0.76 mg/dL (ref 0.20–1.20)
BUN: 13.2 mg/dL (ref 7.0–26.0)
CO2: 25 mEq/L (ref 22–29)
Calcium: 9.7 mg/dL (ref 8.4–10.4)
Chloride: 106 mEq/L (ref 98–109)
Creatinine: 1 mg/dL (ref 0.6–1.1)
GLUCOSE: 105 mg/dL (ref 70–140)
POTASSIUM: 3.7 meq/L (ref 3.5–5.1)
Sodium: 140 mEq/L (ref 136–145)
Total Protein: 7.9 g/dL (ref 6.4–8.3)

## 2013-12-09 MED ORDER — SODIUM CHLORIDE 0.9 % IV SOLN
INTRAVENOUS | Status: DC
  Administered 2013-12-09: 15:00:00 via INTRAVENOUS

## 2013-12-09 MED ORDER — ZOLEDRONIC ACID 4 MG/100ML IV SOLN
4.0000 mg | Freq: Once | INTRAVENOUS | Status: AC
  Administered 2013-12-09: 4 mg via INTRAVENOUS
  Filled 2013-12-09: qty 100

## 2013-12-09 MED ORDER — PNEUMOCOCCAL 13-VAL CONJ VACC IM SUSP
0.5000 mL | Freq: Once | INTRAMUSCULAR | Status: AC
  Administered 2013-12-09: 0.5 mL via INTRAMUSCULAR
  Filled 2013-12-09: qty 0.5

## 2013-12-09 MED ORDER — INFLUENZA VAC SPLIT QUAD 0.5 ML IM SUSY
0.5000 mL | PREFILLED_SYRINGE | Freq: Once | INTRAMUSCULAR | Status: AC
  Administered 2013-12-09: 0.5 mL via INTRAMUSCULAR
  Filled 2013-12-09: qty 0.5

## 2013-12-09 NOTE — Telephone Encounter (Signed)
gv pt appt schedule for jan and april 2016

## 2013-12-09 NOTE — Progress Notes (Signed)
  [facility] OFFICE PROGRESS NOTE   Diagnosis:  Multiple myeloma  INTERVAL HISTORY:   Ms. Tison returns as scheduled. She continues Revlimid 5 mg daily. She feels well. No interim illnesses or infections. She denies pain. No nausea or vomiting. Bowels moving regularly. No urinary symptoms.  Objective:  Vital signs in last 24 hours:  Blood pressure 143/83, pulse 77, temperature 98.2 F (36.8 C), temperature source Oral, resp. rate 19, height 5' 5.5" (1.664 m), weight 203 lb 6.4 oz (92.262 kg), SpO2 100.00%.    HEENT: No thrush or ulcers. Lymphatics: No palpable cervical, supraclavicular or axillary lymph nodes. Resp: Clear bilaterally. Cardio: Regular rate and rhythm. GI: Abdomen soft and nontender. No hepatomegaly. No splenomegaly. Vascular: No leg edema. The left lower leg is slightly larger than the right lower leg.   Lab Results:  Lab Results  Component Value Date   WBC 2.7* 12/09/2013   HGB 13.6 12/09/2013   HCT 40.7 12/09/2013   MCV 88.7 12/09/2013   PLT 211 12/09/2013   NEUTROABS 0.8* 12/09/2013    Imaging:  No results found.  Medications: I have reviewed the patient's current medications.  Assessment/Plan: 1. Multiple myeloma, IgG kappa, status post 7 cycles of Revlimid/Decadron and Velcade with marked clinical and laboratory improvement. She is status post melphalan conditioning, followed by an autologous stem cell infusion 09/10/2008. She began maintenance Revlimid 01/04/2009. The serum M-spike was not detected on an SPEP in August 2015. She continues maintenance Revlimid,  2. Neutropenia while on Revlimid at a dose of 10 mg daily. The Revlimid dose was reduced to 5 mg in September 2011 due to persistent neutropenia and a hospital admission with pneumonia/sepsis. Mild neutropenia persists. 3. Admission 11/03/2009 with left lung pneumonia and sepsis syndrome. No specific organism was cultured during the hospital admission. She completed outpatient  Levaquin therapy.  4. 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  5. Thoracic spine compression fracture, status post a kyphoplasty 12/05/2007. A metastatic bone survey was unchanged on 12/01/2013. 6. Tiny pulmonary embolism on a CT 11/20/2007, now maintained off of Coumadin.  7. Chronic tachycardia on repeat office visits at the [facility].  8. History of hypertension. She reports the blood pressure is lower when checked at home.   Disposition: Ms. Saggese appears well. She remains in clinical remission from multiple myeloma. She will continue Revlimid. We will followup on the serum protein electrophoresis and light chains from today. She will continue Zometa every 3 months.  She will receive the influenza and pneumococcal valent 13 vaccines today.  She will return for a followup visit in 6 months. She will contact the office in the interim with any problems. We specifically discussed fever, chills, other signs of infection.    Ned Card ANP/GNP-BC   12/09/2013  2:20 PM

## 2013-12-09 NOTE — Patient Instructions (Signed)

## 2013-12-11 LAB — IGG, IGA, IGM
IGA: 741 mg/dL — AB (ref 69–380)
IGM, SERUM: 33 mg/dL — AB (ref 52–322)
IgG (Immunoglobin G), Serum: 1730 mg/dL — ABNORMAL HIGH (ref 690–1700)

## 2013-12-11 LAB — PROTEIN ELECTROPHORESIS, SERUM, WITH REFLEX
ALPHA-1-GLOBULIN: 4.1 % (ref 2.9–4.9)
Albumin ELP: 46.4 % — ABNORMAL LOW (ref 55.8–66.1)
Alpha-2-Globulin: 11.8 % (ref 7.1–11.8)
BETA 2: 8.4 % — AB (ref 3.2–6.5)
BETA GLOBULIN: 5.9 % (ref 4.7–7.2)
Gamma Globulin: 23.4 % — ABNORMAL HIGH (ref 11.1–18.8)
TOTAL PROTEIN, SERUM ELECTROPHOR: 7.7 g/dL (ref 6.0–8.3)

## 2013-12-11 LAB — KAPPA/LAMBDA LIGHT CHAINS
KAPPA FREE LGHT CHN: 6.77 mg/dL — AB (ref 0.33–1.94)
Kappa:Lambda Ratio: 1.47 (ref 0.26–1.65)
LAMBDA FREE LGHT CHN: 4.62 mg/dL — AB (ref 0.57–2.63)

## 2013-12-11 LAB — IFE INTERPRETATION

## 2013-12-16 ENCOUNTER — Telehealth: Payer: Self-pay | Admitting: *Deleted

## 2013-12-16 DIAGNOSIS — C9000 Multiple myeloma not having achieved remission: Secondary | ICD-10-CM

## 2013-12-16 NOTE — Telephone Encounter (Signed)
Message copied by Tania Ade on Wed Dec 16, 2013  2:04 PM ------      Message from: Ladell Pier      Created: Sun Dec 13, 2013 12:27 PM       Please call patient, IgA is higher, significance unclear, no monoclonal protein, her MM was IgG, IgG slightly higher-but immunofixation revealed no monoclonal protein      Repeat IgA level , spep, and light chains next visit for Zometa or office ------

## 2013-12-16 NOTE — Telephone Encounter (Signed)
Notified Carrie Huber of increase in IgA and IgG, however the SPEP shows no monoclonial protein. Reminded her that her myeloma was IgG. MD wants to recheck labs at her next Zometa appointment. Patient agrees to this plan. Will repeat labs in January.

## 2013-12-18 ENCOUNTER — Other Ambulatory Visit: Payer: Self-pay | Admitting: *Deleted

## 2013-12-18 DIAGNOSIS — C9000 Multiple myeloma not having achieved remission: Secondary | ICD-10-CM

## 2013-12-18 MED ORDER — LENALIDOMIDE 5 MG PO CAPS: 5.0000 mg | ORAL_CAPSULE | Freq: Every day | ORAL | Status: DC

## 2013-12-18 NOTE — Addendum Note (Signed)
Addended by: Wyonia Hough on: 12/18/2013 03:04 PM   Modules accepted: Orders

## 2013-12-18 NOTE — Telephone Encounter (Signed)
THIS REFILL REQUEST FOR REVLIMID WAS PLACED ON DR.SHERRILL'S DESK.

## 2013-12-21 NOTE — Telephone Encounter (Signed)
[facility] faxed confirmation of facsimile receipt for revlimid prescription referral.  [facility] will verify insurance and make delivery arrangements with patient.

## 2013-12-24 NOTE — Telephone Encounter (Signed)
RECEIVED A FAX FROM [facility] CONCERNING A CONFIRMATION OF PRESCRIPTION SHIPMENT FOR REVLIMID ON 12/23/13.

## 2014-01-14 ENCOUNTER — Other Ambulatory Visit: Payer: Self-pay | Admitting: *Deleted

## 2014-01-14 DIAGNOSIS — C9000 Multiple myeloma not having achieved remission: Secondary | ICD-10-CM

## 2014-01-14 MED ORDER — LENALIDOMIDE 5 MG PO CAPS: 5.0000 mg | ORAL_CAPSULE | Freq: Every day | ORAL | Status: DC

## 2014-01-14 NOTE — Telephone Encounter (Signed)
THIS REFILL REQUEST FOR REVLIMID WAS PLACED ON DR.SHERRILL'S DESK.

## 2014-01-14 NOTE — Telephone Encounter (Signed)
Left VM for Carrie Huber to call [employer] to complete her patient survey that is now due before Revlimid can be shipped.

## 2014-01-14 NOTE — Addendum Note (Signed)
Addended by: Tania Ade on: 01/14/2014 01:58 PM   Modules accepted: Orders

## 2014-01-15 NOTE — Telephone Encounter (Signed)
RECEIVED A FAX FROM [facility] CONCERNING A CONFIRMATION OF FACSIMILE RECEIPT FOR PT. REFERRAL. 

## 2014-01-20 NOTE — Telephone Encounter (Signed)
RECEIVED A FAX FROM [facility] CONCERNING A CONFIRMATION OF PRESCRIPTION SHIPMENT FOR REVLIMID ON 01/19/14.

## 2014-01-29 NOTE — Progress Notes (Signed)
Plan for Prevnar vaccine next visit if she has not received it .

## 2014-02-12 ENCOUNTER — Other Ambulatory Visit: Payer: Self-pay | Admitting: *Deleted

## 2014-02-12 DIAGNOSIS — C9000 Multiple myeloma not having achieved remission: Secondary | ICD-10-CM

## 2014-02-12 MED ORDER — LENALIDOMIDE 5 MG PO CAPS: 5.0000 mg | ORAL_CAPSULE | Freq: Every day | ORAL | Status: DC

## 2014-02-12 NOTE — Addendum Note (Signed)
Addended by: Wyonia Hough on: 02/12/2014 10:09 AM   Modules accepted: Orders

## 2014-02-12 NOTE — Telephone Encounter (Signed)
RECEIVED A FAX FROM [facility] CONCERNING A CONFIRMATION OF FACSIMILE RECEIPT FOR PT. REFERRAL. 

## 2014-03-11 ENCOUNTER — Other Ambulatory Visit (HOSPITAL_BASED_OUTPATIENT_CLINIC_OR_DEPARTMENT_OTHER): Payer: BC Managed Care – PPO

## 2014-03-11 ENCOUNTER — Ambulatory Visit (HOSPITAL_BASED_OUTPATIENT_CLINIC_OR_DEPARTMENT_OTHER): Payer: BC Managed Care – PPO

## 2014-03-11 DIAGNOSIS — C9000 Multiple myeloma not having achieved remission: Secondary | ICD-10-CM

## 2014-03-11 LAB — COMPREHENSIVE METABOLIC PANEL (CC13)
ALT: 18 U/L (ref 0–55)
ANION GAP: 10 meq/L (ref 3–11)
AST: 21 U/L (ref 5–34)
Albumin: 3.5 g/dL (ref 3.5–5.0)
Alkaline Phosphatase: 73 U/L (ref 40–150)
BUN: 13.1 mg/dL (ref 7.0–26.0)
CALCIUM: 9.2 mg/dL (ref 8.4–10.4)
CHLORIDE: 105 meq/L (ref 98–109)
CO2: 27 meq/L (ref 22–29)
CREATININE: 1.1 mg/dL (ref 0.6–1.1)
EGFR: 64 mL/min/{1.73_m2} — ABNORMAL LOW (ref >90)
Glucose: 84 mg/dl (ref 70–140)
Potassium: 3.6 mEq/L (ref 3.5–5.1)
SODIUM: 142 meq/L (ref 136–145)
TOTAL PROTEIN: 7.9 g/dL (ref 6.4–8.3)
Total Bilirubin: 0.89 mg/dL (ref 0.20–1.20)

## 2014-03-11 LAB — CBC WITH DIFFERENTIAL/PLATELET
BASO%: 0.8 % (ref 0.0–2.0)
Basophils Absolute: 0 10*3/uL (ref 0.0–0.1)
EOS ABS: 0.1 10*3/uL (ref 0.0–0.5)
EOS%: 3.9 % (ref 0.0–7.0)
HCT: 40.5 % (ref 34.8–46.6)
HEMOGLOBIN: 13.3 g/dL (ref 11.6–15.9)
LYMPH%: 41.3 % (ref 14.0–49.7)
MCH: 29.8 pg (ref 25.1–34.0)
MCHC: 32.8 g/dL (ref 31.5–36.0)
MCV: 90.8 fL (ref 79.5–101.0)
MONO#: 0.4 10*3/uL (ref 0.1–0.9)
MONO%: 15.1 % — ABNORMAL HIGH (ref 0.0–14.0)
NEUT#: 1 10*3/uL — ABNORMAL LOW (ref 1.5–6.5)
NEUT%: 38.9 % (ref 38.4–76.8)
NRBC: 0 % (ref 0–0)
Platelets: 198 10*3/uL (ref 145–400)
RBC: 4.46 10*6/uL (ref 3.70–5.45)
RDW: 14.9 % — AB (ref 11.2–14.5)
WBC: 2.6 10*3/uL — ABNORMAL LOW (ref 3.9–10.3)
lymph#: 1.1 10*3/uL (ref 0.9–3.3)

## 2014-03-11 MED ORDER — ZOLEDRONIC ACID 4 MG/100ML IV SOLN
4.0000 mg | Freq: Once | INTRAVENOUS | Status: AC
  Administered 2014-03-11: 4 mg via INTRAVENOUS
  Filled 2014-03-11: qty 100

## 2014-03-11 NOTE — Patient Instructions (Signed)

## 2014-03-12 ENCOUNTER — Other Ambulatory Visit: Payer: Self-pay | Admitting: *Deleted

## 2014-03-12 LAB — IGA: IgA: 692 mg/dL — ABNORMAL HIGH (ref 69–380)

## 2014-03-12 LAB — KAPPA/LAMBDA LIGHT CHAINS
Kappa free light chain: 5.26 mg/dL — ABNORMAL HIGH (ref 0.33–1.94)
Kappa:Lambda Ratio: 1.17 (ref 0.26–1.65)
LAMBDA FREE LGHT CHN: 4.49 mg/dL — AB (ref 0.57–2.63)

## 2014-03-12 NOTE — Telephone Encounter (Signed)
THIS REFILL REQUEST FOR REVLIMID WAS PLACED ON DR.SHERRILL'S DESK.

## 2014-03-15 LAB — PROTEIN ELECTROPHORESIS, SERUM
ALBUMIN ELP: 47.9 % — AB (ref 55.8–66.1)
ALPHA-2-GLOBULIN: 11.3 % (ref 7.1–11.8)
Alpha-1-Globulin: 4.3 % (ref 2.9–4.9)
BETA 2: 8.4 % — AB (ref 3.2–6.5)
BETA GLOBULIN: 5.4 % (ref 4.7–7.2)
Gamma Globulin: 22.7 % — ABNORMAL HIGH (ref 11.1–18.8)
TOTAL PROTEIN, SERUM ELECTROPHOR: 7.5 g/dL (ref 6.0–8.3)

## 2014-03-17 ENCOUNTER — Other Ambulatory Visit: Payer: Self-pay

## 2014-03-17 DIAGNOSIS — C9000 Multiple myeloma not having achieved remission: Secondary | ICD-10-CM

## 2014-03-17 MED ORDER — LENALIDOMIDE 5 MG PO CAPS: 5.0000 mg | ORAL_CAPSULE | Freq: Every day | ORAL | Status: DC

## 2014-04-09 ENCOUNTER — Telehealth: Payer: Self-pay

## 2014-04-09 NOTE — Telephone Encounter (Signed)
Fax for revlimid refill request to Dr Gearldine Shown desk

## 2014-04-12 ENCOUNTER — Other Ambulatory Visit: Payer: Self-pay | Admitting: *Deleted

## 2014-04-12 DIAGNOSIS — C9000 Multiple myeloma not having achieved remission: Secondary | ICD-10-CM

## 2014-04-12 MED ORDER — LENALIDOMIDE 5 MG PO CAPS: 5.0000 mg | ORAL_CAPSULE | Freq: Every day | ORAL | Status: DC

## 2014-04-12 NOTE — Telephone Encounter (Signed)
Revlimid refill faxed to [facility] 

## 2014-05-07 ENCOUNTER — Other Ambulatory Visit: Payer: Self-pay | Admitting: *Deleted

## 2014-05-07 DIAGNOSIS — C9000 Multiple myeloma not having achieved remission: Secondary | ICD-10-CM

## 2014-05-07 MED ORDER — LENALIDOMIDE 5 MG PO CAPS: 5.0000 mg | ORAL_CAPSULE | Freq: Every day | ORAL | Status: DC

## 2014-05-07 NOTE — Telephone Encounter (Signed)
Received fax request from [facility] for refill for revlimid.  To Dr. Benay Spice for review.

## 2014-06-09 ENCOUNTER — Ambulatory Visit: Payer: BC Managed Care – PPO | Admitting: Oncology

## 2014-06-09 ENCOUNTER — Telehealth: Payer: Self-pay | Admitting: Oncology

## 2014-06-09 ENCOUNTER — Telehealth: Payer: Self-pay | Admitting: *Deleted

## 2014-06-09 NOTE — Telephone Encounter (Signed)
Pt called to r/s she is in [place] with son who is sick. Pt confirmed labs/ov r/s and sent msg to add chemo...Marland KitchenMarland KitchenMarland Kitchen Mailed out schedule to pt... KJ, sent msg to pt through my chart also to confirm

## 2014-06-09 NOTE — Telephone Encounter (Signed)
Per staff message and POF I have scheduled appts. Advised scheduler of appts. JMW  

## 2014-06-10 ENCOUNTER — Other Ambulatory Visit: Payer: Self-pay | Admitting: *Deleted

## 2014-06-10 ENCOUNTER — Ambulatory Visit: Payer: BC Managed Care – PPO

## 2014-06-10 ENCOUNTER — Ambulatory Visit: Payer: BC Managed Care – PPO | Admitting: Oncology

## 2014-06-10 ENCOUNTER — Other Ambulatory Visit: Payer: BC Managed Care – PPO

## 2014-06-10 DIAGNOSIS — C9000 Multiple myeloma not having achieved remission: Secondary | ICD-10-CM

## 2014-06-10 MED ORDER — LENALIDOMIDE 5 MG PO CAPS: 5.0000 mg | ORAL_CAPSULE | Freq: Every day | ORAL | Status: DC

## 2014-06-18 ENCOUNTER — Ambulatory Visit (HOSPITAL_BASED_OUTPATIENT_CLINIC_OR_DEPARTMENT_OTHER): Payer: BC Managed Care – PPO | Admitting: Oncology

## 2014-06-18 ENCOUNTER — Telehealth: Payer: Self-pay | Admitting: Oncology

## 2014-06-18 ENCOUNTER — Other Ambulatory Visit (HOSPITAL_COMMUNITY)
Admission: AD | Admit: 2014-06-18 | Discharge: 2014-06-18 | Disposition: A | Discharge status: Home / Self Care | Payer: BC Managed Care – PPO | Source: Ambulatory Visit | Attending: Oncology | Admitting: Oncology

## 2014-06-18 ENCOUNTER — Ambulatory Visit (HOSPITAL_BASED_OUTPATIENT_CLINIC_OR_DEPARTMENT_OTHER): Payer: BC Managed Care – PPO

## 2014-06-18 ENCOUNTER — Other Ambulatory Visit (HOSPITAL_BASED_OUTPATIENT_CLINIC_OR_DEPARTMENT_OTHER): Payer: BC Managed Care – PPO

## 2014-06-18 ENCOUNTER — Other Ambulatory Visit: Payer: Self-pay | Admitting: *Deleted

## 2014-06-18 VITALS — BP 150/81 | HR 78 | Temp 98.3°F | Resp 18 | Ht 65.5 in | Wt 208.4 lb

## 2014-06-18 DIAGNOSIS — C9001 Multiple myeloma in remission: Secondary | ICD-10-CM

## 2014-06-18 DIAGNOSIS — C9000 Multiple myeloma not having achieved remission: Secondary | ICD-10-CM

## 2014-06-18 DIAGNOSIS — D701 Agranulocytosis secondary to cancer chemotherapy: Secondary | ICD-10-CM

## 2014-06-18 LAB — CBC WITH DIFFERENTIAL/PLATELET
BASO%: 1.5 % (ref 0.0–2.0)
Basophils Absolute: 0 10*3/uL (ref 0.0–0.1)
EOS ABS: 0.1 10*3/uL (ref 0.0–0.5)
EOS%: 4.9 % (ref 0.0–7.0)
HEMATOCRIT: 39.2 % (ref 34.8–46.6)
HGB: 13.3 g/dL (ref 11.6–15.9)
LYMPH%: 52.3 % — ABNORMAL HIGH (ref 14.0–49.7)
MCH: 30.5 pg (ref 25.1–34.0)
MCHC: 33.9 g/dL (ref 31.5–36.0)
MCV: 89.9 fL (ref 79.5–101.0)
MONO#: 0.3 10*3/uL (ref 0.1–0.9)
MONO%: 11.7 % (ref 0.0–14.0)
NEUT%: 29.6 % — AB (ref 38.4–76.8)
NEUTROS ABS: 0.8 10*3/uL — AB (ref 1.5–6.5)
NRBC: 0 % (ref 0–0)
PLATELETS: 190 10*3/uL (ref 145–400)
RBC: 4.36 10*6/uL (ref 3.70–5.45)
RDW: 14.4 % (ref 11.2–14.5)
WBC: 2.7 10*3/uL — ABNORMAL LOW (ref 3.9–10.3)
lymph#: 1.4 10*3/uL (ref 0.9–3.3)

## 2014-06-18 LAB — COMPREHENSIVE METABOLIC PANEL
ALBUMIN: 3.5 g/dL (ref 3.5–5.2)
ALK PHOS: 60 U/L (ref 39–117)
ALT: 19 U/L (ref 0–35)
AST: 30 U/L (ref 0–37)
Anion gap: 8 (ref 5–15)
BUN: 11 mg/dL (ref 6–23)
CHLORIDE: 106 mmol/L (ref 96–112)
CO2: 23 mmol/L (ref 19–32)
CREATININE: 1.03 mg/dL (ref 0.50–1.10)
Calcium: 8.7 mg/dL (ref 8.4–10.5)
GFR calc Af Amer: 67 mL/min — ABNORMAL LOW (ref >90)
GFR calc non Af Amer: 57 mL/min — ABNORMAL LOW (ref >90)
GLUCOSE: 84 mg/dL (ref 70–99)
Potassium: 4.6 mmol/L (ref 3.5–5.1)
Sodium: 137 mmol/L (ref 135–145)
TOTAL PROTEIN: 7.7 g/dL (ref 6.0–8.3)
Total Bilirubin: 1.7 mg/dL — ABNORMAL HIGH (ref 0.3–1.2)

## 2014-06-18 MED ORDER — ZOLEDRONIC ACID 4 MG/100ML IV SOLN
4.0000 mg | Freq: Once | INTRAVENOUS | Status: AC
  Administered 2014-06-18: 4 mg via INTRAVENOUS
  Filled 2014-06-18: qty 100

## 2014-06-18 NOTE — Progress Notes (Signed)
  [facility] OFFICE PROGRESS NOTE   Diagnosis: Multiple myeloma  INTERVAL HISTORY:   Ms. [name] returns as scheduled. She feels well. She will continues Revlimid. Intermittent mild numbness in the toes. No recent infection. No pain. She continues every three-month Zometa. No jaw pain.  Objective:  Vital signs in last 24 hours:  Blood pressure 150/81, pulse 78, temperature 98.3 F (36.8 C), temperature source Oral, resp. rate 18, height 5' 5.5" (1.664 m), weight 208 lb 6.4 oz (94.53 kg), SpO2 100 %.    HEENT: No thrush or ulcers Resp: Lungs clear bilaterally Cardio: Regular rate and rhythm GI: No hepatosplenomegaly Vascular: The left lower leg is slightly larger than the right side, no edema   Lab Results:  Lab Results  Component Value Date   WBC 2.7* 06/18/2014   HGB 13.3 06/18/2014   HCT 39.2 06/18/2014   MCV 89.9 06/18/2014   PLT 190 06/18/2014   NEUTROABS 0.8* 06/18/2014   Creatinine 1.03, calcium 8.7  03/11/2014-Are free light chains 5.26, lambda free light chains 4.49, serum M spike not detected  Medications: I have reviewed the patient's current medications.  Assessment/Plan: 1. Multiple myeloma, IgG kappa, status post 7 cycles of Revlimid/Decadron and Velcade with marked clinical and laboratory improvement. She is status post melphalan conditioning, followed by an autologous stem cell infusion 09/10/2008. She began maintenance Revlimid 01/04/2009. The serum M-spike was not detected on an SPEP in January 2016. She continues maintenance Revlimid,  2. Neutropenia while on Revlimid at a dose of 10 mg daily. The Revlimid dose was reduced to 5 mg in September 2011 due to persistent neutropenia and a hospital admission with pneumonia/sepsis. Mild neutropenia persists. 3. Admission 11/03/2009 with left lung pneumonia and sepsis syndrome. No specific organism was cultured during the hospital admission. She completed outpatient Levaquin therapy.  4. History  of anemia secondary to multiple myeloma, status post a red cell transfusion in September 2009.  5. Thoracic spine compression fracture, status post a kyphoplasty 12/05/2007. A metastatic bone survey was unchanged on 12/01/2013. 6. Tiny pulmonary embolism on a CT 11/20/2007, now maintained off of Coumadin.  7. Chronic tachycardia on repeat office visits at the [facility].  8. History of hypertension. She reports the blood pressure is lower when checked at home.     Disposition:  She remains in clinical remission from multiple myeloma. Carrie Huber will continue maintenance Revlimid. She knows to contact us for a fever or symptoms of an infection. She continues every three-month Zometa. She will return for a CBC in 6 weeks. We will follow-up on the serum protein electrophoresis and serum free light chains from today.  [name] will be scheduled for an office visit and metastatic bone survey in 6 months.  Betsy Coder, MD  06/18/2014  12:27 PM

## 2014-06-18 NOTE — Progress Notes (Signed)
1330: Pt refused for lab to draw labs this morning before MD appt.  Pt came back to infusion area with lab tubes to draw labs from PIV start.  PIV started to right forearm and attempted to draw labs from IV start.  Collected blood tubes sent to lab.  Per pharmacy we have to wait on CMET results before administering Zometa. Informed patient and she verbalized understanding.

## 2014-06-18 NOTE — Patient Instructions (Signed)

## 2014-06-18 NOTE — Telephone Encounter (Signed)
gave and printed appt sched and avs for pt for May, July and OCT....sed added tx.

## 2014-06-22 LAB — PROTEIN ELECTROPHORESIS, SERUM
Albumin ELP: 3.1 g/dL — ABNORMAL LOW (ref 3.8–4.8)
Alpha-1-Globulin: 0.3 g/dL (ref 0.2–0.3)
Alpha-2-Globulin: 0.8 g/dL (ref 0.5–0.9)
BETA 2: 0.6 g/dL — AB (ref 0.2–0.5)
Beta Globulin: 0.4 g/dL (ref 0.4–0.6)
GAMMA GLOBULIN: 1.6 g/dL (ref 0.8–1.7)
Total Protein, Serum Electrophoresis: 6.8 g/dL (ref 6.1–8.1)

## 2014-06-22 LAB — KAPPA/LAMBDA LIGHT CHAINS
Kappa free light chain: 5.85 mg/dL — ABNORMAL HIGH (ref 0.33–1.94)
Kappa:Lambda Ratio: 1.37 (ref 0.26–1.65)
LAMBDA FREE LGHT CHN: 4.27 mg/dL — AB (ref 0.57–2.63)

## 2014-07-02 ENCOUNTER — Other Ambulatory Visit: Payer: Self-pay | Admitting: *Deleted

## 2014-07-02 ENCOUNTER — Telehealth: Payer: Self-pay

## 2014-07-02 DIAGNOSIS — C9000 Multiple myeloma not having achieved remission: Secondary | ICD-10-CM

## 2014-07-02 MED ORDER — LENALIDOMIDE 5 MG PO CAPS: 5.0000 mg | ORAL_CAPSULE | Freq: Every day | ORAL | Status: DC

## 2014-07-02 NOTE — Telephone Encounter (Signed)
lvm pt needs to take [employer] survey for revlimid

## 2014-07-05 ENCOUNTER — Telehealth: Payer: Self-pay | Admitting: *Deleted

## 2014-07-05 NOTE — Telephone Encounter (Signed)
[facility] will confirm insurance and make delivery arrangements

## 2014-07-29 ENCOUNTER — Telehealth: Payer: Self-pay | Admitting: *Deleted

## 2014-07-29 ENCOUNTER — Other Ambulatory Visit: Payer: Self-pay | Admitting: *Deleted

## 2014-07-29 ENCOUNTER — Other Ambulatory Visit (HOSPITAL_BASED_OUTPATIENT_CLINIC_OR_DEPARTMENT_OTHER): Payer: BC Managed Care – PPO

## 2014-07-29 DIAGNOSIS — C9000 Multiple myeloma not having achieved remission: Secondary | ICD-10-CM

## 2014-07-29 DIAGNOSIS — D701 Agranulocytosis secondary to cancer chemotherapy: Secondary | ICD-10-CM | POA: Diagnosis not present

## 2014-07-29 DIAGNOSIS — C9001 Multiple myeloma in remission: Secondary | ICD-10-CM

## 2014-07-29 LAB — CBC WITH DIFFERENTIAL/PLATELET
BASO%: 1.4 % (ref 0.0–2.0)
BASOS ABS: 0 10*3/uL (ref 0.0–0.1)
EOS%: 5.3 % (ref 0.0–7.0)
Eosinophils Absolute: 0.2 10*3/uL (ref 0.0–0.5)
HEMATOCRIT: 39 % (ref 34.8–46.6)
HGB: 13 g/dL (ref 11.6–15.9)
LYMPH%: 50.4 % — AB (ref 14.0–49.7)
MCH: 29.9 pg (ref 25.1–34.0)
MCHC: 33.3 g/dL (ref 31.5–36.0)
MCV: 89.7 fL (ref 79.5–101.0)
MONO#: 0.4 10*3/uL (ref 0.1–0.9)
MONO%: 12.8 % (ref 0.0–14.0)
NEUT#: 0.9 10*3/uL — ABNORMAL LOW (ref 1.5–6.5)
NEUT%: 30.1 % — AB (ref 38.4–76.8)
NRBC: 0 % (ref 0–0)
Platelets: 176 10*3/uL (ref 145–400)
RBC: 4.35 10*6/uL (ref 3.70–5.45)
RDW: 14.8 % — ABNORMAL HIGH (ref 11.2–14.5)
WBC: 2.8 10*3/uL — ABNORMAL LOW (ref 3.9–10.3)
lymph#: 1.4 10*3/uL (ref 0.9–3.3)

## 2014-07-29 LAB — COMPREHENSIVE METABOLIC PANEL (CC13)
ALT: 14 U/L (ref 0–55)
ANION GAP: 12 meq/L — AB (ref 3–11)
AST: 22 U/L (ref 5–34)
Albumin: 3.1 g/dL — ABNORMAL LOW (ref 3.5–5.0)
Alkaline Phosphatase: 63 U/L (ref 40–150)
BUN: 13.8 mg/dL (ref 7.0–26.0)
CHLORIDE: 110 meq/L — AB (ref 98–109)
CO2: 18 meq/L — AB (ref 22–29)
Calcium: 8.7 mg/dL (ref 8.4–10.4)
Creatinine: 0.9 mg/dL (ref 0.6–1.1)
EGFR: 76 mL/min/{1.73_m2} — ABNORMAL LOW (ref >90)
Glucose: 94 mg/dl (ref 70–140)
POTASSIUM: 3.9 meq/L (ref 3.5–5.1)
Sodium: 140 mEq/L (ref 136–145)
Total Bilirubin: 0.78 mg/dL (ref 0.20–1.20)
Total Protein: 7.2 g/dL (ref 6.4–8.3)

## 2014-07-29 MED ORDER — LENALIDOMIDE 5 MG PO CAPS: 5.0000 mg | ORAL_CAPSULE | Freq: Every day | ORAL | Status: DC

## 2014-07-29 NOTE — Telephone Encounter (Signed)
[facility] faxed revlimid refill request.  Request to provider's desk/in-basket for review.

## 2014-08-27 ENCOUNTER — Other Ambulatory Visit: Payer: Self-pay | Admitting: *Deleted

## 2014-08-27 ENCOUNTER — Telehealth: Payer: Self-pay

## 2014-08-27 DIAGNOSIS — C9000 Multiple myeloma not having achieved remission: Secondary | ICD-10-CM

## 2014-08-27 MED ORDER — LENALIDOMIDE 5 MG PO CAPS: 5.0000 mg | ORAL_CAPSULE | Freq: Every day | ORAL | Status: DC

## 2014-08-27 NOTE — Telephone Encounter (Signed)
revlimid refill request to Dr Benay Spice RN

## 2014-09-10 ENCOUNTER — Other Ambulatory Visit: Payer: BC Managed Care – PPO

## 2014-09-10 ENCOUNTER — Telehealth: Payer: Self-pay | Admitting: Oncology

## 2014-09-10 ENCOUNTER — Ambulatory Visit: Payer: BC Managed Care – PPO

## 2014-09-10 NOTE — Telephone Encounter (Signed)
Pt called to r/s labs/Zometa today, confirmed updated schedule.... KJ

## 2014-09-17 ENCOUNTER — Ambulatory Visit (HOSPITAL_BASED_OUTPATIENT_CLINIC_OR_DEPARTMENT_OTHER): Payer: BC Managed Care – PPO

## 2014-09-17 ENCOUNTER — Other Ambulatory Visit (HOSPITAL_BASED_OUTPATIENT_CLINIC_OR_DEPARTMENT_OTHER): Payer: BC Managed Care – PPO

## 2014-09-17 VITALS — BP 150/74 | HR 74 | Temp 97.6°F | Resp 18

## 2014-09-17 DIAGNOSIS — C9001 Multiple myeloma in remission: Secondary | ICD-10-CM

## 2014-09-17 DIAGNOSIS — D701 Agranulocytosis secondary to cancer chemotherapy: Secondary | ICD-10-CM | POA: Diagnosis not present

## 2014-09-17 DIAGNOSIS — C9000 Multiple myeloma not having achieved remission: Secondary | ICD-10-CM

## 2014-09-17 LAB — CBC WITH DIFFERENTIAL/PLATELET
BASO%: 1.3 % (ref 0.0–2.0)
Basophils Absolute: 0 10*3/uL (ref 0.0–0.1)
EOS%: 3.8 % (ref 0.0–7.0)
Eosinophils Absolute: 0.1 10*3/uL (ref 0.0–0.5)
HEMATOCRIT: 37.7 % (ref 34.8–46.6)
HGB: 12.3 g/dL (ref 11.6–15.9)
LYMPH%: 43.3 % (ref 14.0–49.7)
MCH: 29.7 pg (ref 25.1–34.0)
MCHC: 32.6 g/dL (ref 31.5–36.0)
MCV: 91.1 fL (ref 79.5–101.0)
MONO#: 0.3 10*3/uL (ref 0.1–0.9)
MONO%: 14.2 % — ABNORMAL HIGH (ref 0.0–14.0)
NEUT%: 37.4 % — AB (ref 38.4–76.8)
NEUTROS ABS: 0.9 10*3/uL — AB (ref 1.5–6.5)
Platelets: 200 10*3/uL (ref 145–400)
RBC: 4.14 10*6/uL (ref 3.70–5.45)
RDW: 14.8 % — ABNORMAL HIGH (ref 11.2–14.5)
WBC: 2.4 10*3/uL — ABNORMAL LOW (ref 3.9–10.3)
lymph#: 1 10*3/uL (ref 0.9–3.3)

## 2014-09-17 LAB — BASIC METABOLIC PANEL (CC13)
Anion Gap: 7 mEq/L (ref 3–11)
BUN: 10.9 mg/dL (ref 7.0–26.0)
CO2: 26 meq/L (ref 22–29)
Calcium: 9.3 mg/dL (ref 8.4–10.4)
Chloride: 110 mEq/L — ABNORMAL HIGH (ref 98–109)
Creatinine: 0.9 mg/dL (ref 0.6–1.1)
EGFR: 76 mL/min/{1.73_m2} — ABNORMAL LOW (ref >90)
GLUCOSE: 110 mg/dL (ref 70–140)
POTASSIUM: 3.5 meq/L (ref 3.5–5.1)
Sodium: 142 mEq/L (ref 136–145)

## 2014-09-17 MED ORDER — ZOLEDRONIC ACID 4 MG/100ML IV SOLN
4.0000 mg | Freq: Once | INTRAVENOUS | Status: AC
  Administered 2014-09-17: 4 mg via INTRAVENOUS
  Filled 2014-09-17: qty 100

## 2014-09-17 NOTE — Patient Instructions (Signed)

## 2014-09-24 ENCOUNTER — Other Ambulatory Visit: Payer: Self-pay | Admitting: *Deleted

## 2014-09-24 DIAGNOSIS — C9000 Multiple myeloma not having achieved remission: Secondary | ICD-10-CM

## 2014-09-24 MED ORDER — LENALIDOMIDE 5 MG PO CAPS: 5.0000 mg | ORAL_CAPSULE | Freq: Every day | ORAL | Status: DC

## 2014-10-22 ENCOUNTER — Other Ambulatory Visit: Payer: Self-pay | Admitting: *Deleted

## 2014-10-22 DIAGNOSIS — C9000 Multiple myeloma not having achieved remission: Secondary | ICD-10-CM

## 2014-10-22 MED ORDER — LENALIDOMIDE 5 MG PO CAPS: 5.0000 mg | ORAL_CAPSULE | Freq: Every day | ORAL | Status: DC

## 2014-11-24 ENCOUNTER — Other Ambulatory Visit: Payer: Self-pay | Admitting: *Deleted

## 2014-11-24 DIAGNOSIS — C9000 Multiple myeloma not having achieved remission: Secondary | ICD-10-CM

## 2014-11-24 MED ORDER — LENALIDOMIDE 5 MG PO CAPS: 5.0000 mg | ORAL_CAPSULE | Freq: Every day | ORAL | Status: DC

## 2014-11-29 ENCOUNTER — Encounter: Payer: Self-pay | Admitting: Oncology

## 2014-11-29 NOTE — Progress Notes (Signed)
Per Gabe revlimid prior auth was sent to [facility]

## 2014-11-29 NOTE — Progress Notes (Signed)
I faxed revlimid prior auth to [facility]

## 2014-11-30 ENCOUNTER — Encounter: Payer: Self-pay | Admitting: Oncology

## 2014-11-30 NOTE — Progress Notes (Signed)
Per [facility]. revlimid approved 10/30/14-11/28/17  Case id# [id number]. I will send to medical records

## 2014-12-01 ENCOUNTER — Encounter: Payer: Self-pay | Admitting: Oncology

## 2014-12-01 NOTE — Progress Notes (Signed)
Per [facility] revlimid was shipped via [employer] °

## 2014-12-03 ENCOUNTER — Ambulatory Visit (HOSPITAL_COMMUNITY)
Admission: RE | Admit: 2014-12-03 | Discharge: 2014-12-03 | Disposition: A | Discharge status: Home / Self Care | Payer: BC Managed Care – PPO | Source: Ambulatory Visit | Attending: Oncology | Admitting: Oncology

## 2014-12-03 DIAGNOSIS — C9000 Multiple myeloma not having achieved remission: Secondary | ICD-10-CM

## 2014-12-10 ENCOUNTER — Ambulatory Visit (HOSPITAL_COMMUNITY): Payer: BC Managed Care – PPO

## 2014-12-17 ENCOUNTER — Other Ambulatory Visit (HOSPITAL_BASED_OUTPATIENT_CLINIC_OR_DEPARTMENT_OTHER): Payer: BC Managed Care – PPO

## 2014-12-17 ENCOUNTER — Ambulatory Visit (HOSPITAL_BASED_OUTPATIENT_CLINIC_OR_DEPARTMENT_OTHER): Payer: BC Managed Care – PPO | Admitting: Oncology

## 2014-12-17 ENCOUNTER — Ambulatory Visit (HOSPITAL_BASED_OUTPATIENT_CLINIC_OR_DEPARTMENT_OTHER): Payer: BC Managed Care – PPO

## 2014-12-17 ENCOUNTER — Telehealth: Payer: Self-pay | Admitting: Oncology

## 2014-12-17 VITALS — BP 153/82 | HR 72 | Temp 98.4°F | Resp 16 | Ht 65.5 in | Wt 208.8 lb

## 2014-12-17 DIAGNOSIS — C9000 Multiple myeloma not having achieved remission: Secondary | ICD-10-CM

## 2014-12-17 DIAGNOSIS — Z23 Encounter for immunization: Secondary | ICD-10-CM

## 2014-12-17 DIAGNOSIS — D701 Agranulocytosis secondary to cancer chemotherapy: Secondary | ICD-10-CM | POA: Diagnosis not present

## 2014-12-17 DIAGNOSIS — C9001 Multiple myeloma in remission: Secondary | ICD-10-CM

## 2014-12-17 LAB — CBC WITH DIFFERENTIAL/PLATELET
BASO%: 1.8 % (ref 0.0–2.0)
Basophils Absolute: 0 10*3/uL (ref 0.0–0.1)
EOS ABS: 0.1 10*3/uL (ref 0.0–0.5)
EOS%: 4.3 % (ref 0.0–7.0)
HCT: 36.8 % (ref 34.8–46.6)
HGB: 12.1 g/dL (ref 11.6–15.9)
LYMPH#: 0.9 10*3/uL (ref 0.9–3.3)
LYMPH%: 48.8 % (ref 14.0–49.7)
MCH: 29.4 pg (ref 25.1–34.0)
MCHC: 32.9 g/dL (ref 31.5–36.0)
MCV: 89.2 fL (ref 79.5–101.0)
MONO#: 0.2 10*3/uL (ref 0.1–0.9)
MONO%: 10.3 % (ref 0.0–14.0)
NEUT%: 34.8 % — ABNORMAL LOW (ref 38.4–76.8)
NEUTROS ABS: 0.6 10*3/uL — AB (ref 1.5–6.5)
PLATELETS: 178 10*3/uL (ref 145–400)
RBC: 4.12 10*6/uL (ref 3.70–5.45)
RDW: 15.1 % — ABNORMAL HIGH (ref 11.2–14.5)
WBC: 1.8 10*3/uL — AB (ref 3.9–10.3)

## 2014-12-17 LAB — BASIC METABOLIC PANEL (CC13)
Anion Gap: 7 mEq/L (ref 3–11)
BUN: 14 mg/dL (ref 7.0–26.0)
CHLORIDE: 108 meq/L (ref 98–109)
CO2: 27 meq/L (ref 22–29)
Calcium: 8.9 mg/dL (ref 8.4–10.4)
Creatinine: 1 mg/dL (ref 0.6–1.1)
EGFR: 66 mL/min/{1.73_m2} — ABNORMAL LOW (ref >90)
Glucose: 88 mg/dl (ref 70–140)
Potassium: 3.7 mEq/L (ref 3.5–5.1)
SODIUM: 141 meq/L (ref 136–145)

## 2014-12-17 MED ORDER — INFLUENZA VAC SPLIT QUAD 0.5 ML IM SUSY
0.5000 mL | PREFILLED_SYRINGE | Freq: Once | INTRAMUSCULAR | Status: AC
  Administered 2014-12-17: 0.5 mL via INTRAMUSCULAR
  Filled 2014-12-17: qty 0.5

## 2014-12-17 MED ORDER — PNEUMOCOCCAL VAC POLYVALENT 25 MCG/0.5ML IJ INJ
0.5000 mL | INJECTION | Freq: Once | INTRAMUSCULAR | Status: AC
  Administered 2014-12-17: 0.5 mL via INTRAMUSCULAR
  Filled 2014-12-17: qty 0.5

## 2014-12-17 MED ORDER — ZOLEDRONIC ACID 4 MG/100ML IV SOLN
4.0000 mg | Freq: Once | INTRAVENOUS | Status: AC
  Administered 2014-12-17: 4 mg via INTRAVENOUS
  Filled 2014-12-17: qty 100

## 2014-12-17 NOTE — Patient Instructions (Signed)

## 2014-12-17 NOTE — Progress Notes (Signed)
  Carrie Huber OFFICE PROGRESS NOTE   Diagnosis: Multiple myeloma  INTERVAL HISTORY:   Carrie Huber returns as scheduled. She continues maintenance Revlimid. She reports malaise. She relates this to taking care of her mother over the past week. No pain or infection. She has developed leg and foot edema while being more active on her feet.  Objective:  Vital signs in last 24 hours:  Blood pressure 153/82, pulse 72, temperature 98.4 F (36.9 C), temperature source Oral, resp. rate 16, height 5' 5.5" (1.664 m), weight 208 lb 12.8 oz (94.711 kg), SpO2 100 %.    Resp: Lungs with mild inspiratory wheeze at the left chest, no respiratory distress Cardio: Regular rate and rhythm GI: No hepatosplenomegaly Vascular: Trace pitting edema at the left greater than right lower leg   Lab Results:  Lab Results  Component Value Date   WBC 1.8* 12/17/2014   HGB 12.1 12/17/2014   HCT 36.8 12/17/2014   MCV 89.2 12/17/2014   PLT 178 12/17/2014   NEUTROABS 0.6* 12/17/2014   Potassium 3.7, creatinine 1.0  06/18/2014-Are free light chains 5.85, lambda free light chains 4.27   Imaging: Bone survey 12/03/2014, compared to 12/01/2013-stable lytic lesions in the calvarium and humeri, stable compression fractures, Worsen "moth-eaten "appearance of the marrow throughout the pelvic girdle I reviewed the images with 2 radiologists and the pelvis bones are not changed significantly with differences between the 2 studies potentially attributed to differences in technique   Medications: I have reviewed the patient's current medications.  Assessment/Plan: 1. Multiple myeloma, IgG kappa, status post 7 cycles of Revlimid/Decadron and Velcade with marked clinical and laboratory improvement. She is status post melphalan conditioning, followed by an autologous stem cell infusion 09/10/2008. She began maintenance Revlimid 01/04/2009. The serum M-spike was not detected on an SPEP in January 2016. She  continues maintenance Revlimid,  2. Neutropenia while on Revlimid at a dose of 10 mg daily. The Revlimid dose was reduced to 5 mg in September 2011 due to persistent neutropenia and a hospital admission with pneumonia/sepsis.  neutropenia persists. 3. Admission 11/03/2009 with left lung pneumonia and sepsis syndrome. No specific organism was cultured during the hospital admission. She completed outpatient Levaquin therapy.  4. 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  5. Thoracic spine compression fracture, status post a kyphoplasty 12/05/2007. A metastatic bone survey was unchanged on 12/01/2013. 6. Tiny pulmonary embolism on a CT 11/20/2007, now maintained off of Coumadin.  7. Chronic tachycardia on repeat office visits at the [facility]  8. History of hypertension. She reports the blood pressure is lower when checked at home.  Disposition:  She appears stable. We will follow-up on the serum protein electrophoresis and serum light chains from today. She continues every three-month Zometa. She has persistent neutropenia while on Revlimid. She will return for a CBC in 3 weeks. She knows to contact us for a fever or symptoms of an infection. I reviewed the bone survey images in radiology. The myeloma lesions have not changed significantly.  Carrie Huber will return for an office visit and Zometa in 3 months. She received an influenza vaccine and a 23 valent pneumococcal vaccine today.  Betsy Coder, MD  12/17/2014  8:56 AM

## 2014-12-17 NOTE — Telephone Encounter (Signed)
Left message on voicemail for patient re appointments for November 2016 and January 2017.

## 2014-12-21 LAB — PROTEIN ELECTROPHORESIS, SERUM
Albumin ELP: 3.3 g/dL — ABNORMAL LOW (ref 3.8–4.8)
Alpha-1-Globulin: 0.3 g/dL (ref 0.2–0.3)
Alpha-2-Globulin: 0.8 g/dL (ref 0.5–0.9)
Beta 2: 0.5 g/dL (ref 0.2–0.5)
Beta Globulin: 0.4 g/dL (ref 0.4–0.6)
Gamma Globulin: 1.7 g/dL (ref 0.8–1.7)
TOTAL PROTEIN, SERUM ELECTROPHOR: 7 g/dL (ref 6.1–8.1)

## 2014-12-21 LAB — KAPPA/LAMBDA LIGHT CHAINS
KAPPA LAMBDA RATIO: 1.79 — AB (ref 0.26–1.65)
Kappa free light chain: 9.9 mg/dL — ABNORMAL HIGH (ref 0.33–1.94)
Lambda Free Lght Chn: 5.54 mg/dL — ABNORMAL HIGH (ref 0.57–2.63)

## 2014-12-21 LAB — IGG, IGA, IGM
IGG (IMMUNOGLOBIN G), SERUM: 1520 mg/dL (ref 690–1700)
IgA: 622 mg/dL — ABNORMAL HIGH (ref 69–380)
IgM, Serum: 37 mg/dL — ABNORMAL LOW (ref 52–322)

## 2014-12-22 ENCOUNTER — Other Ambulatory Visit: Payer: Self-pay | Admitting: *Deleted

## 2014-12-22 MED ORDER — LENALIDOMIDE 5 MG PO CAPS: 5.0000 mg | ORAL_CAPSULE | Freq: Every day | ORAL | Status: DC

## 2014-12-28 ENCOUNTER — Encounter: Payer: Self-pay | Admitting: Oncology

## 2014-12-28 NOTE — Progress Notes (Signed)
Per [facility] revlimid shipped via fedex

## 2015-01-06 ENCOUNTER — Other Ambulatory Visit: Payer: BC Managed Care – PPO

## 2015-01-13 ENCOUNTER — Other Ambulatory Visit (HOSPITAL_BASED_OUTPATIENT_CLINIC_OR_DEPARTMENT_OTHER): Payer: BC Managed Care – PPO

## 2015-01-13 DIAGNOSIS — C9000 Multiple myeloma not having achieved remission: Secondary | ICD-10-CM

## 2015-01-13 LAB — CBC WITH DIFFERENTIAL/PLATELET
BASO%: 1.2 % (ref 0.0–2.0)
BASOS ABS: 0 10*3/uL (ref 0.0–0.1)
EOS%: 3.5 % (ref 0.0–7.0)
Eosinophils Absolute: 0.1 10*3/uL (ref 0.0–0.5)
HEMATOCRIT: 37 % (ref 34.8–46.6)
HGB: 12 g/dL (ref 11.6–15.9)
LYMPH%: 45.2 % (ref 14.0–49.7)
MCH: 29.2 pg (ref 25.1–34.0)
MCHC: 32.6 g/dL (ref 31.5–36.0)
MCV: 89.8 fL (ref 79.5–101.0)
MONO#: 0.3 10*3/uL (ref 0.1–0.9)
MONO%: 14.9 % — AB (ref 0.0–14.0)
NEUT#: 0.8 10*3/uL — ABNORMAL LOW (ref 1.5–6.5)
NEUT%: 35.2 % — ABNORMAL LOW (ref 38.4–76.8)
PLATELETS: 188 10*3/uL (ref 145–400)
RBC: 4.12 10*6/uL (ref 3.70–5.45)
RDW: 15.5 % — ABNORMAL HIGH (ref 11.2–14.5)
WBC: 2.2 10*3/uL — ABNORMAL LOW (ref 3.9–10.3)
lymph#: 1 10*3/uL (ref 0.9–3.3)

## 2015-01-13 LAB — BASIC METABOLIC PANEL (CC13)
Anion Gap: 7 mEq/L (ref 3–11)
BUN: 11.7 mg/dL (ref 7.0–26.0)
CHLORIDE: 108 meq/L (ref 98–109)
CO2: 24 meq/L (ref 22–29)
CREATININE: 1 mg/dL (ref 0.6–1.1)
Calcium: 9 mg/dL (ref 8.4–10.4)
EGFR: 72 mL/min/{1.73_m2} — ABNORMAL LOW (ref >90)
Glucose: 91 mg/dl (ref 70–140)
Potassium: 3.6 mEq/L (ref 3.5–5.1)
SODIUM: 140 meq/L (ref 136–145)

## 2015-01-21 ENCOUNTER — Other Ambulatory Visit: Payer: Self-pay | Admitting: *Deleted

## 2015-01-21 MED ORDER — LENALIDOMIDE 5 MG PO CAPS: 5.0000 mg | ORAL_CAPSULE | Freq: Every day | ORAL | Status: DC

## 2015-02-15 ENCOUNTER — Other Ambulatory Visit: Payer: Self-pay | Admitting: *Deleted

## 2015-02-15 MED ORDER — LENALIDOMIDE 5 MG PO CAPS: 5.0000 mg | ORAL_CAPSULE | Freq: Every day | ORAL | Status: DC

## 2015-03-02 ENCOUNTER — Other Ambulatory Visit: Payer: Self-pay | Admitting: Nurse Practitioner

## 2015-03-17 ENCOUNTER — Other Ambulatory Visit: Payer: Self-pay | Admitting: *Deleted

## 2015-03-17 MED ORDER — LENALIDOMIDE 5 MG PO CAPS: 5.0000 mg | ORAL_CAPSULE | Freq: Every day | ORAL | Status: DC

## 2015-03-18 ENCOUNTER — Telehealth: Payer: Self-pay | Admitting: Oncology

## 2015-03-18 ENCOUNTER — Other Ambulatory Visit (HOSPITAL_BASED_OUTPATIENT_CLINIC_OR_DEPARTMENT_OTHER): Payer: BC Managed Care – PPO

## 2015-03-18 ENCOUNTER — Ambulatory Visit (HOSPITAL_BASED_OUTPATIENT_CLINIC_OR_DEPARTMENT_OTHER): Payer: BC Managed Care – PPO | Admitting: Oncology

## 2015-03-18 ENCOUNTER — Ambulatory Visit (HOSPITAL_BASED_OUTPATIENT_CLINIC_OR_DEPARTMENT_OTHER): Payer: BC Managed Care – PPO

## 2015-03-18 ENCOUNTER — Telehealth: Payer: Self-pay | Admitting: *Deleted

## 2015-03-18 VITALS — BP 135/62 | HR 82 | Temp 98.3°F | Resp 18 | Ht 65.5 in | Wt 203.7 lb

## 2015-03-18 DIAGNOSIS — I10 Essential (primary) hypertension: Secondary | ICD-10-CM | POA: Diagnosis not present

## 2015-03-18 DIAGNOSIS — D701 Agranulocytosis secondary to cancer chemotherapy: Secondary | ICD-10-CM | POA: Diagnosis not present

## 2015-03-18 DIAGNOSIS — C9000 Multiple myeloma not having achieved remission: Secondary | ICD-10-CM | POA: Diagnosis not present

## 2015-03-18 DIAGNOSIS — C9001 Multiple myeloma in remission: Secondary | ICD-10-CM

## 2015-03-18 LAB — CBC WITH DIFFERENTIAL/PLATELET
BASO%: 1.2 % (ref 0.0–2.0)
Basophils Absolute: 0 10*3/uL (ref 0.0–0.1)
EOS%: 4 % (ref 0.0–7.0)
Eosinophils Absolute: 0.1 10*3/uL (ref 0.0–0.5)
HCT: 37.7 % (ref 34.8–46.6)
HGB: 12.2 g/dL (ref 11.6–15.9)
LYMPH%: 40.1 % (ref 14.0–49.7)
MCH: 29 pg (ref 25.1–34.0)
MCHC: 32.3 g/dL (ref 31.5–36.0)
MCV: 89.9 fL (ref 79.5–101.0)
MONO#: 0.3 10*3/uL (ref 0.1–0.9)
MONO%: 17 % — ABNORMAL HIGH (ref 0.0–14.0)
NEUT%: 37.7 % — ABNORMAL LOW (ref 38.4–76.8)
NEUTROS ABS: 0.8 10*3/uL — AB (ref 1.5–6.5)
PLATELETS: 212 10*3/uL (ref 145–400)
RBC: 4.19 10*6/uL (ref 3.70–5.45)
RDW: 17.4 % — ABNORMAL HIGH (ref 11.2–14.5)
WBC: 2 10*3/uL — AB (ref 3.9–10.3)
lymph#: 0.8 10*3/uL — ABNORMAL LOW (ref 0.9–3.3)

## 2015-03-18 LAB — COMPREHENSIVE METABOLIC PANEL
ALK PHOS: 73 U/L (ref 40–150)
ALT: 18 U/L (ref 0–55)
AST: 26 U/L (ref 5–34)
Albumin: 3.1 g/dL — ABNORMAL LOW (ref 3.5–5.0)
Anion Gap: 7 mEq/L (ref 3–11)
BUN: 17.2 mg/dL (ref 7.0–26.0)
CO2: 25 meq/L (ref 22–29)
CREATININE: 1.1 mg/dL (ref 0.6–1.1)
Calcium: 9.2 mg/dL (ref 8.4–10.4)
Chloride: 107 mEq/L (ref 98–109)
EGFR: 63 mL/min/{1.73_m2} — ABNORMAL LOW (ref >90)
Glucose: 93 mg/dl (ref 70–140)
Potassium: 4.1 mEq/L (ref 3.5–5.1)
Sodium: 139 mEq/L (ref 136–145)
TOTAL PROTEIN: 7.7 g/dL (ref 6.4–8.3)
Total Bilirubin: 0.64 mg/dL (ref 0.20–1.20)

## 2015-03-18 MED ORDER — ZOLEDRONIC ACID 4 MG/100ML IV SOLN
4.0000 mg | Freq: Once | INTRAVENOUS | Status: AC
  Administered 2015-03-18: 4 mg via INTRAVENOUS
  Filled 2015-03-18: qty 100

## 2015-03-18 MED ORDER — ZOLEDRONIC ACID 4 MG/5ML IV CONC: 4.0000 mg | Freq: Once | INTRAVENOUS | Status: DC

## 2015-03-18 NOTE — Patient Instructions (Signed)
Zoledronic Acid injection (Hypercalcemia, Oncology) (Zometa) What is this medicine? ZOLEDRONIC ACID (ZOE le dron ik AS id) lowers the amount of calcium loss from bone. It is used to treat too much calcium in your blood from cancer. It is also used to prevent complications of cancer that has spread to the bone. This medicine may be used for other purposes; ask your health care provider or pharmacist if you have questions. What should I tell my health care provider before I take this medicine? They need to know if you have any of these conditions: -aspirin-sensitive asthma -cancer, especially if you are receiving medicines used to treat cancer -dental disease or wear dentures -infection -kidney disease -receiving corticosteroids like dexamethasone or prednisone -an unusual or allergic reaction to zoledronic acid, other medicines, foods, dyes, or preservatives -pregnant or trying to get pregnant -breast-feeding How should I use this medicine? This medicine is for infusion into a vein. It is given by a health care professional in a hospital or clinic setting. Talk to your pediatrician regarding the use of this medicine in children. Special care may be needed. Overdosage: If you think you have taken too much of this medicine contact a poison control center or emergency room at once. NOTE: This medicine is only for you. Do not share this medicine with others. What if I miss a dose? It is important not to miss your dose. Call your doctor or health care professional if you are unable to keep an appointment. What may interact with this medicine? -certain antibiotics given by injection -NSAIDs, medicines for pain and inflammation, like ibuprofen or naproxen -some diuretics like bumetanide, furosemide -teriparatide -thalidomide This list may not describe all possible interactions. Give your health care provider a list of all the medicines, herbs, non-prescription drugs, or dietary supplements you  use. Also tell them if you smoke, drink alcohol, or use illegal drugs. Some items may interact with your medicine. What should I watch for while using this medicine? Visit your doctor or health care professional for regular checkups. It may be some time before you see the benefit from this medicine. Do not stop taking your medicine unless your doctor tells you to. Your doctor may order blood tests or other tests to see how you are doing. Women should inform their doctor if they wish to become pregnant or think they might be pregnant. There is a potential for serious side effects to an unborn child. Talk to your health care professional or pharmacist for more information. You should make sure that you get enough calcium and vitamin D while you are taking this medicine. Discuss the foods you eat and the vitamins you take with your health care professional. Some people who take this medicine have severe bone, joint, and/or muscle pain. This medicine may also increase your risk for jaw problems or a broken thigh bone. Tell your doctor right away if you have severe pain in your jaw, bones, joints, or muscles. Tell your doctor if you have any pain that does not go away or that gets worse. Tell your dentist and dental surgeon that you are taking this medicine. You should not have major dental surgery while on this medicine. See your dentist to have a dental exam and fix any dental problems before starting this medicine. Take good care of your teeth while on this medicine. Make sure you see your dentist for regular follow-up appointments. What side effects may I notice from receiving this medicine? Side effects that you should report   to your doctor or health care professional as soon as possible: -allergic reactions like skin rash, itching or hives, swelling of the face, lips, or tongue -anxiety, confusion, or depression -breathing problems -changes in vision -eye pain -feeling faint or lightheaded,  falls -jaw pain, especially after dental work -mouth sores -muscle cramps, stiffness, or weakness -redness, blistering, peeling or loosening of the skin, including inside the mouth -trouble passing urine or change in the amount of urine Side effects that usually do not require medical attention (report to your doctor or health care professional if they continue or are bothersome): -bone, joint, or muscle pain -constipation -diarrhea -fever -hair loss -irritation at site where injected -loss of appetite -nausea, vomiting -stomach upset -trouble sleeping -trouble swallowing -weak or tired This list may not describe all possible side effects. Call your doctor for medical advice about side effects. You may report side effects to FDA at 1-800-FDA-1088. Where should I keep my medicine? This drug is given in a hospital or clinic and will not be stored at home. NOTE: This sheet is a summary. It may not cover all possible information. If you have questions about this medicine, talk to your doctor, pharmacist, or health care provider.    2016, Elsevier/Gold Standard. (2013-07-18 14:19:39)  

## 2015-03-18 NOTE — Progress Notes (Signed)
  Carrie Huber OFFICE PROGRESS NOTE   Diagnosis: Multiple myeloma  INTERVAL HISTORY:   Carrie Huber returns as scheduled. She feels well. She continues Revlimid. She has occasional numbness in the toes. No fever or recent infection. No pain. She is exercising.  Objective:  Vital signs in last 24 hours:  Blood pressure 135/62, pulse 82, temperature 98.3 F (36.8 C), temperature source Oral, resp. rate 18, height 5' 5.5" (1.664 m), weight 203 lb 11.2 oz (92.398 kg), SpO2 100 %.    HEENT: No thrush Resp: Lungs clear bilaterally Cardio: Regular rate and rhythm GI: No hepatosplenomegaly, nontender Vascular: No leg edema  Lab Results:  Lab Results  Component Value Date   WBC 2.0* 03/18/2015   HGB 12.2 03/18/2015   HCT 37.7 03/18/2015   MCV 89.9 03/18/2015   PLT 212 03/18/2015   NEUTROABS 0.8* 03/18/2015     Medications: I have reviewed the patient's current medications.  Assessment/Plan: 1. Multiple myeloma, IgG kappa, status post 7 cycles of Revlimid/Decadron and Velcade with marked clinical and laboratory improvement. She is status post melphalan conditioning, followed by an autologous stem cell infusion 09/10/2008. She began maintenance Revlimid 01/04/2009. The serum M-spike was not detected on an SPEP in October 2016. She continues maintenance Revlimid,  2. Neutropenia while on Revlimid at a dose of 10 mg daily. The Revlimid dose was reduced to 5 mg in September 2011 due to persistent neutropenia and a hospital admission with pneumonia/sepsis. neutropenia persists. 3. Admission 11/03/2009 with left lung pneumonia and sepsis syndrome. No specific organism was cultured during the hospital admission. She completed outpatient Levaquin therapy.  4. 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  5. Thoracic spine compression fracture, status post a kyphoplasty 12/05/2007. A metastatic bone survey was unchanged  on 12/01/2013. 6. Tiny pulmonary embolism on a CT 11/20/2007, now maintained off of Coumadin.  7. Chronic tachycardia on repeat office visits at the [facility].  8. History of hypertension.    Disposition:  Carrie Huber appears stable. She will continue Revlimid. She will receive Zometa today. She will return for Zometa in 3 months and an office visit in 6 months. We will follow-up on the myeloma panel from today.  Betsy Coder, MD  03/18/2015  9:06 AM

## 2015-03-18 NOTE — Telephone Encounter (Signed)
Per staff message and POF I have scheduled appts. Advised scheduler of appts. JMW  

## 2015-03-18 NOTE — Telephone Encounter (Signed)
Confirmed appt; gave avs

## 2015-03-21 LAB — PROTEIN ELECTROPHORESIS, SERUM
A/G Ratio: 0.8 (ref 0.7–1.7)
ALPHA 1: 0.2 g/dL (ref 0.0–0.4)
Albumin: 3.1 g/dL (ref 2.9–4.4)
Alpha 2: 0.8 g/dL (ref 0.4–1.0)
BETA: 1.3 g/dL (ref 0.7–1.3)
GAMMA GLOBULIN: 1.6 g/dL (ref 0.4–1.8)
Globulin, Total: 3.8 g/dL (ref 2.2–3.9)
Total Protein: 6.9 g/dL (ref 6.0–8.5)

## 2015-03-21 LAB — KAPPA/LAMBDA LIGHT CHAINS
Ig Kappa Free Light Chain: 93.77 mg/L — ABNORMAL HIGH (ref 3.30–19.40)
Ig Lambda Free Light Chain: 62.48 mg/L — ABNORMAL HIGH (ref 5.71–26.30)
KAPPA/LAMBDA FLC RATIO: 1.5 (ref 0.26–1.65)

## 2015-03-22 ENCOUNTER — Telehealth: Payer: Self-pay | Admitting: *Deleted

## 2015-03-22 NOTE — Telephone Encounter (Signed)
Per Dr. Benay Spice; notified pt that light chains are stable, will repeat 3 months-day of zometa.  Pt verbalized understanding and expressed appreciation for call.

## 2015-03-22 NOTE — Telephone Encounter (Signed)
-----   Message from Ladell Pier, MD sent at 03/21/2015  8:20 PM EST ----- Please call patient,light chains are stable, repeat 3 months-day of zometa

## 2015-03-23 ENCOUNTER — Encounter: Payer: Self-pay | Admitting: Oncology

## 2015-03-23 NOTE — Progress Notes (Signed)
Per [facility] revlimid was shipped via fedex 03/22/15

## 2015-04-19 ENCOUNTER — Telehealth: Payer: Self-pay

## 2015-04-19 ENCOUNTER — Other Ambulatory Visit: Payer: Self-pay | Admitting: *Deleted

## 2015-04-19 MED ORDER — LENALIDOMIDE 5 MG PO CAPS: 5.0000 mg | ORAL_CAPSULE | Freq: Every day | ORAL | Status: DC

## 2015-04-19 NOTE — Telephone Encounter (Signed)
Notified pt that refill for Revlimid has been sent to her pharmacy.  Pt verbalized understanding and expressed appreciation for call.

## 2015-04-19 NOTE — Telephone Encounter (Signed)
Pt called stating she needs revlimid refill. She takes every day and only has 1 left.

## 2015-04-21 ENCOUNTER — Other Ambulatory Visit: Payer: Self-pay | Admitting: *Deleted

## 2015-04-21 ENCOUNTER — Encounter: Payer: Self-pay | Admitting: Oncology

## 2015-04-21 NOTE — Progress Notes (Signed)
Per [facility] revlimid was shipped via fedex 04/20/15

## 2015-05-16 ENCOUNTER — Other Ambulatory Visit: Payer: Self-pay | Admitting: *Deleted

## 2015-05-16 DIAGNOSIS — C9000 Multiple myeloma not having achieved remission: Secondary | ICD-10-CM

## 2015-05-16 MED ORDER — LENALIDOMIDE 5 MG PO CAPS: 5.0000 mg | ORAL_CAPSULE | Freq: Every day | ORAL | Status: DC

## 2015-05-20 ENCOUNTER — Telehealth: Payer: Self-pay | Admitting: *Deleted

## 2015-05-20 NOTE — Telephone Encounter (Signed)
Call from pt reporting her grant for Revlimid needs to be renewed. Form has been faxed to office by pharmacy. Unable to locate form. Called [employer], spoke to [place]- she will re-fax form to [employer].

## 2015-05-23 ENCOUNTER — Encounter: Payer: Self-pay | Admitting: Oncology

## 2015-05-23 NOTE — Progress Notes (Signed)
Received [employer] physician form on my desk. Delivered to Dr.Sherrill for signature and faxed back to [employer]. Fax received ok per confirmation sheet.

## 2015-06-16 ENCOUNTER — Other Ambulatory Visit (HOSPITAL_BASED_OUTPATIENT_CLINIC_OR_DEPARTMENT_OTHER): Payer: BC Managed Care – PPO

## 2015-06-16 ENCOUNTER — Other Ambulatory Visit: Payer: Self-pay

## 2015-06-16 ENCOUNTER — Ambulatory Visit (HOSPITAL_BASED_OUTPATIENT_CLINIC_OR_DEPARTMENT_OTHER): Payer: BC Managed Care – PPO

## 2015-06-16 VITALS — BP 142/74 | HR 59 | Temp 98.0°F | Resp 17

## 2015-06-16 DIAGNOSIS — C9000 Multiple myeloma not having achieved remission: Secondary | ICD-10-CM

## 2015-06-16 DIAGNOSIS — C9001 Multiple myeloma in remission: Secondary | ICD-10-CM

## 2015-06-16 LAB — CBC WITH DIFFERENTIAL/PLATELET
BASO%: 1 % (ref 0.0–2.0)
Basophils Absolute: 0 10*3/uL (ref 0.0–0.1)
EOS%: 6 % (ref 0.0–7.0)
Eosinophils Absolute: 0.1 10*3/uL (ref 0.0–0.5)
HEMATOCRIT: 36.3 % (ref 34.8–46.6)
HEMOGLOBIN: 11.7 g/dL (ref 11.6–15.9)
LYMPH#: 1 10*3/uL (ref 0.9–3.3)
LYMPH%: 48.4 % (ref 14.0–49.7)
MCH: 28.5 pg (ref 25.1–34.0)
MCHC: 32.4 g/dL (ref 31.5–36.0)
MCV: 88.1 fL (ref 79.5–101.0)
MONO#: 0.3 10*3/uL (ref 0.1–0.9)
MONO%: 13.5 % (ref 0.0–14.0)
NEUT%: 31.1 % — ABNORMAL LOW (ref 38.4–76.8)
NEUTROS ABS: 0.6 10*3/uL — AB (ref 1.5–6.5)
Platelets: 174 10*3/uL (ref 145–400)
RBC: 4.12 10*6/uL (ref 3.70–5.45)
RDW: 16.8 % — AB (ref 11.2–14.5)
WBC: 2 10*3/uL — AB (ref 3.9–10.3)

## 2015-06-16 LAB — BASIC METABOLIC PANEL
ANION GAP: 6 meq/L (ref 3–11)
BUN: 11.8 mg/dL (ref 7.0–26.0)
CALCIUM: 9 mg/dL (ref 8.4–10.4)
CHLORIDE: 108 meq/L (ref 98–109)
CO2: 27 meq/L (ref 22–29)
Creatinine: 1 mg/dL (ref 0.6–1.1)
EGFR: 67 mL/min/{1.73_m2} — AB (ref >90)
Glucose: 87 mg/dl (ref 70–140)
POTASSIUM: 3.5 meq/L (ref 3.5–5.1)
Sodium: 141 mEq/L (ref 136–145)

## 2015-06-16 MED ORDER — ZOLEDRONIC ACID 4 MG/100ML IV SOLN
4.0000 mg | Freq: Once | INTRAVENOUS | Status: AC
  Administered 2015-06-16: 4 mg via INTRAVENOUS
  Filled 2015-06-16: qty 100

## 2015-06-16 MED ORDER — SODIUM CHLORIDE 0.9 % IV SOLN
Freq: Once | INTRAVENOUS | Status: AC
  Administered 2015-06-16: 09:00:00 via INTRAVENOUS

## 2015-06-16 NOTE — Patient Instructions (Signed)
Zoledronic Acid injection (Hypercalcemia, Oncology) (Zometa) What is this medicine? ZOLEDRONIC ACID (ZOE le dron ik AS id) lowers the amount of calcium loss from bone. It is used to treat too much calcium in your blood from cancer. It is also used to prevent complications of cancer that has spread to the bone. This medicine may be used for other purposes; ask your health care provider or pharmacist if you have questions. What should I tell my health care provider before I take this medicine? They need to know if you have any of these conditions: -aspirin-sensitive asthma -cancer, especially if you are receiving medicines used to treat cancer -dental disease or wear dentures -infection -kidney disease -receiving corticosteroids like dexamethasone or prednisone -an unusual or allergic reaction to zoledronic acid, other medicines, foods, dyes, or preservatives -pregnant or trying to get pregnant -breast-feeding How should I use this medicine? This medicine is for infusion into a vein. It is given by a health care professional in a hospital or clinic setting. Talk to your pediatrician regarding the use of this medicine in children. Special care may be needed. Overdosage: If you think you have taken too much of this medicine contact a poison control center or emergency room at once. NOTE: This medicine is only for you. Do not share this medicine with others. What if I miss a dose? It is important not to miss your dose. Call your doctor or health care professional if you are unable to keep an appointment. What may interact with this medicine? -certain antibiotics given by injection -NSAIDs, medicines for pain and inflammation, like ibuprofen or naproxen -some diuretics like bumetanide, furosemide -teriparatide -thalidomide This list may not describe all possible interactions. Give your health care provider a list of all the medicines, herbs, non-prescription drugs, or dietary supplements you  use. Also tell them if you smoke, drink alcohol, or use illegal drugs. Some items may interact with your medicine. What should I watch for while using this medicine? Visit your doctor or health care professional for regular checkups. It may be some time before you see the benefit from this medicine. Do not stop taking your medicine unless your doctor tells you to. Your doctor may order blood tests or other tests to see how you are doing. Women should inform their doctor if they wish to become pregnant or think they might be pregnant. There is a potential for serious side effects to an unborn child. Talk to your health care professional or pharmacist for more information. You should make sure that you get enough calcium and vitamin D while you are taking this medicine. Discuss the foods you eat and the vitamins you take with your health care professional. Some people who take this medicine have severe bone, joint, and/or muscle pain. This medicine may also increase your risk for jaw problems or a broken thigh bone. Tell your doctor right away if you have severe pain in your jaw, bones, joints, or muscles. Tell your doctor if you have any pain that does not go away or that gets worse. Tell your dentist and dental surgeon that you are taking this medicine. You should not have major dental surgery while on this medicine. See your dentist to have a dental exam and fix any dental problems before starting this medicine. Take good care of your teeth while on this medicine. Make sure you see your dentist for regular follow-up appointments. What side effects may I notice from receiving this medicine? Side effects that you should report   to your doctor or health care professional as soon as possible: -allergic reactions like skin rash, itching or hives, swelling of the face, lips, or tongue -anxiety, confusion, or depression -breathing problems -changes in vision -eye pain -feeling faint or lightheaded,  falls -jaw pain, especially after dental work -mouth sores -muscle cramps, stiffness, or weakness -redness, blistering, peeling or loosening of the skin, including inside the mouth -trouble passing urine or change in the amount of urine Side effects that usually do not require medical attention (report to your doctor or health care professional if they continue or are bothersome): -bone, joint, or muscle pain -constipation -diarrhea -fever -hair loss -irritation at site where injected -loss of appetite -nausea, vomiting -stomach upset -trouble sleeping -trouble swallowing -weak or tired This list may not describe all possible side effects. Call your doctor for medical advice about side effects. You may report side effects to FDA at 1-800-FDA-1088. Where should I keep my medicine? This drug is given in a hospital or clinic and will not be stored at home. NOTE: This sheet is a summary. It may not cover all possible information. If you have questions about this medicine, talk to your doctor, pharmacist, or health care provider.    2016, Elsevier/Gold Standard. (2013-07-18 14:19:39)  

## 2015-06-17 LAB — KAPPA/LAMBDA LIGHT CHAINS
IG KAPPA FREE LIGHT CHAIN: 92.37 mg/L — AB (ref 3.30–19.40)
Ig Lambda Free Light Chain: 62.04 mg/L — ABNORMAL HIGH (ref 5.71–26.30)
KAPPA/LAMBDA FLC RATIO: 1.49 (ref 0.26–1.65)

## 2015-06-17 LAB — IGG, IGA, IGM
IgA, Qn, Serum: 636 mg/dL — ABNORMAL HIGH (ref 87–352)
IgM, Qn, Serum: 50 mg/dL (ref 26–217)

## 2015-06-20 LAB — PROTEIN ELECTROPHORESIS, SERUM
A/G Ratio: 0.9 (ref 0.7–1.7)
ALPHA 1: 0.2 g/dL (ref 0.0–0.4)
ALPHA 2: 0.7 g/dL (ref 0.4–1.0)
Albumin: 3 g/dL (ref 2.9–4.4)
BETA: 1.1 g/dL (ref 0.7–1.3)
Gamma Globulin: 1.5 g/dL (ref 0.4–1.8)
Globulin, Total: 3.5 g/dL (ref 2.2–3.9)
Total Protein: 6.5 g/dL (ref 6.0–8.5)

## 2015-06-21 ENCOUNTER — Other Ambulatory Visit: Payer: Self-pay | Admitting: *Deleted

## 2015-06-21 DIAGNOSIS — C9000 Multiple myeloma not having achieved remission: Secondary | ICD-10-CM

## 2015-06-21 MED ORDER — LENALIDOMIDE 5 MG PO CAPS: 5.0000 mg | ORAL_CAPSULE | Freq: Every day | ORAL | Status: DC

## 2015-06-21 NOTE — Addendum Note (Signed)
Addended by: Brien Few on: 06/21/2015 01:15 PM   Modules accepted: Orders

## 2015-06-21 NOTE — Telephone Encounter (Signed)
Call received in [place] from pt stating she is due to restart revlimid tomorrow and has not received her new shipment.  She contacted [facility] and was informed 2 request for refills have been sent but not returned.  Last refill noted as 05/19/2015.  Pt is requesting refill ASAP-   Best number to contact pt per this call given as [phone].  This message will be sent to MD and RN at desk for refill and pt contact.

## 2015-06-21 NOTE — Telephone Encounter (Signed)
Notified pt that refill has been sent to [facility]. She voiced appreciation for call.

## 2015-07-15 ENCOUNTER — Other Ambulatory Visit: Payer: Self-pay | Admitting: *Deleted

## 2015-07-15 DIAGNOSIS — C9000 Multiple myeloma not having achieved remission: Secondary | ICD-10-CM

## 2015-07-15 MED ORDER — LENALIDOMIDE 5 MG PO CAPS: 5.0000 mg | ORAL_CAPSULE | Freq: Every day | ORAL | Status: DC

## 2015-07-19 ENCOUNTER — Encounter: Payer: Self-pay | Admitting: Oncology

## 2015-07-19 NOTE — Progress Notes (Signed)
Per [facility] revlimid shipped via fed ex 07/18/15

## 2015-08-15 ENCOUNTER — Other Ambulatory Visit: Payer: Self-pay | Admitting: *Deleted

## 2015-08-15 DIAGNOSIS — C9000 Multiple myeloma not having achieved remission: Secondary | ICD-10-CM

## 2015-08-15 MED ORDER — LENALIDOMIDE 5 MG PO CAPS: 5.0000 mg | ORAL_CAPSULE | Freq: Every day | ORAL | Status: DC

## 2015-08-16 ENCOUNTER — Other Ambulatory Visit: Payer: Self-pay | Admitting: *Deleted

## 2015-09-08 ENCOUNTER — Other Ambulatory Visit: Payer: Self-pay | Admitting: *Deleted

## 2015-09-08 DIAGNOSIS — C9000 Multiple myeloma not having achieved remission: Secondary | ICD-10-CM

## 2015-09-08 MED ORDER — LENALIDOMIDE 5 MG PO CAPS: 5.0000 mg | ORAL_CAPSULE | Freq: Every day | ORAL | Status: DC

## 2015-09-08 NOTE — Telephone Encounter (Signed)
Revlimid refill faxed to [facility] 

## 2015-09-15 ENCOUNTER — Other Ambulatory Visit (HOSPITAL_BASED_OUTPATIENT_CLINIC_OR_DEPARTMENT_OTHER): Payer: BC Managed Care – PPO

## 2015-09-15 ENCOUNTER — Ambulatory Visit (HOSPITAL_BASED_OUTPATIENT_CLINIC_OR_DEPARTMENT_OTHER): Payer: BC Managed Care – PPO | Admitting: Oncology

## 2015-09-15 ENCOUNTER — Ambulatory Visit (HOSPITAL_BASED_OUTPATIENT_CLINIC_OR_DEPARTMENT_OTHER): Payer: BC Managed Care – PPO

## 2015-09-15 VITALS — BP 164/76 | HR 78 | Temp 98.9°F | Resp 18 | Ht 65.5 in | Wt 208.5 lb

## 2015-09-15 DIAGNOSIS — C9001 Multiple myeloma in remission: Secondary | ICD-10-CM

## 2015-09-15 DIAGNOSIS — D701 Agranulocytosis secondary to cancer chemotherapy: Secondary | ICD-10-CM | POA: Diagnosis not present

## 2015-09-15 DIAGNOSIS — D630 Anemia in neoplastic disease: Secondary | ICD-10-CM

## 2015-09-15 DIAGNOSIS — R000 Tachycardia, unspecified: Secondary | ICD-10-CM | POA: Diagnosis not present

## 2015-09-15 LAB — BASIC METABOLIC PANEL
ANION GAP: 10 meq/L (ref 3–11)
BUN: 11.2 mg/dL (ref 7.0–26.0)
CALCIUM: 8.8 mg/dL (ref 8.4–10.4)
CO2: 23 meq/L (ref 22–29)
CREATININE: 1 mg/dL (ref 0.6–1.1)
Chloride: 109 mEq/L (ref 98–109)
EGFR: 68 mL/min/{1.73_m2} — AB (ref >90)
GLUCOSE: 95 mg/dL (ref 70–140)
Potassium: 3.3 mEq/L — ABNORMAL LOW (ref 3.5–5.1)
Sodium: 143 mEq/L (ref 136–145)

## 2015-09-15 LAB — CBC WITH DIFFERENTIAL/PLATELET
BASO%: 1.6 % (ref 0.0–2.0)
BASOS ABS: 0 10*3/uL (ref 0.0–0.1)
EOS ABS: 0.1 10*3/uL (ref 0.0–0.5)
EOS%: 4.4 % (ref 0.0–7.0)
HEMATOCRIT: 34.9 % (ref 34.8–46.6)
HEMOGLOBIN: 11.7 g/dL (ref 11.6–15.9)
LYMPH#: 0.9 10*3/uL (ref 0.9–3.3)
LYMPH%: 47.5 % (ref 14.0–49.7)
MCH: 29.3 pg (ref 25.1–34.0)
MCHC: 33.5 g/dL (ref 31.5–36.0)
MCV: 87.5 fL (ref 79.5–101.0)
MONO#: 0.3 10*3/uL (ref 0.1–0.9)
MONO%: 16.9 % — ABNORMAL HIGH (ref 0.0–14.0)
NEUT#: 0.5 10*3/uL — CL (ref 1.5–6.5)
NEUT%: 29.6 % — AB (ref 38.4–76.8)
PLATELETS: 176 10*3/uL (ref 145–400)
RBC: 3.99 10*6/uL (ref 3.70–5.45)
RDW: 17.1 % — ABNORMAL HIGH (ref 11.2–14.5)
WBC: 1.8 10*3/uL — ABNORMAL LOW (ref 3.9–10.3)

## 2015-09-15 MED ORDER — ZOLEDRONIC ACID 4 MG/100ML IV SOLN
4.0000 mg | Freq: Once | INTRAVENOUS | Status: AC
  Administered 2015-09-15: 4 mg via INTRAVENOUS
  Filled 2015-09-15: qty 100

## 2015-09-15 NOTE — Progress Notes (Signed)
  [facility] OFFICE PROGRESS NOTE   Diagnosis:  Multiple,  INTERVAL HISTORY:   Carrie Huber returns as scheduled. She feels well. No recent infection. No pain. She is caring for her elderly mother. She has noted increased leg swelling for the past few months. She relates this to being on her feet more. She saw Dr. Ok Edwards at [facility] last week. A serum immunofixation revealed no monoclonal protein.  Objective:  Vital signs in last 24 hours:  Blood pressure 164/76, pulse 78, temperature 98.9 F (37.2 C), temperature source Oral, resp. rate 18, height 5' 5.5" (1.664 m), weight 208 lb 8 oz (94.575 kg), SpO2 100 %.   Resp: Lungs clear bilaterally Cardio: Regular rate and rhythm GI: No hepatosplenomegaly, nontender Vascular: Trace pitting edema at the left greater than right lower leg and foot   Lab Results:  Lab Results  Component Value Date   WBC 1.8* 09/15/2015   HGB 11.7 09/15/2015   HCT 34.9 09/15/2015   MCV 87.5 09/15/2015   PLT 176 09/15/2015   NEUTROABS 0.5* 09/15/2015  Potassium 3.3, creatinine 1.0  06/16/2015-IgG 1540, free kappa light chains 92.37 mg/L   Medications: I have reviewed the patient's current medications.  Assessment/Plan: 1. Multiple myeloma, IgG kappa, status post 7 cycles of Revlimid/Decadron and Velcade with marked clinical and laboratory improvement. She is status post melphalan conditioning, followed by an autologous stem cell infusion 09/10/2008. She began maintenance Revlimid 01/04/2009. She has persistent mild elevation of the kappa and lambda light chains 2. Neutropenia while on Revlimid at a dose of 10 mg daily. The Revlimid dose was reduced to 5 mg in September 2011 due to persistent neutropenia and a hospital admission with pneumonia/sepsis. neutropenia persists. 3. Admission 11/03/2009 with left lung pneumonia and sepsis syndrome. No specific organism was cultured during the hospital admission. She completed outpatient Levaquin  therapy.  4. 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  5. Thoracic spine compression fracture, status post a kyphoplasty 12/05/2007. A metastatic bone survey was unchanged on 12/01/2013. 6. Tiny pulmonary embolism on a CT 11/20/2007, now maintained off of Coumadin.  7. Chronic tachycardia on repeat office visits at the [facility].  8. History of hypertension.     Disposition:  Carrie Huber remains in clinical remission from multiple myeloma. She continues maintenance Revlimid and every three-month Zometa. She has moderate neutropenia, likely secondary to Revlimid. She knows to contact us for a fever or signs of an infection.  Carrie Huber will return for a CBC in 2 weeks. She will be scheduled for an office visit and Zometa in 3 months.    Betsy Coder, MD  09/15/2015  9:26 AM

## 2015-09-15 NOTE — Progress Notes (Signed)
Reviewed s/s of neutropenia d/t pt anc 0.5. Pt verbalized udnerstanding and will call if there are any further concerns.

## 2015-09-15 NOTE — Patient Instructions (Signed)
Zoledronic Acid injection (Hypercalcemia, Oncology) ([facility]) What is this medicine? ZOLEDRONIC ACID (ZOE le dron ik AS id) lowers the amount of calcium loss from bone. It is used to treat too much calcium in your blood from cancer. It is also used to prevent complications of cancer that has spread to the bone. This medicine may be used for other purposes; ask your health care provider or pharmacist if you have questions. What should I tell my health care provider before I take this medicine? They need to know if you have any of these conditions: -aspirin-sensitive asthma -cancer, especially if you are receiving medicines used to treat cancer -dental disease or wear dentures -infection -kidney disease -receiving corticosteroids like dexamethasone or prednisone -an unusual or allergic reaction to zoledronic acid, other medicines, foods, dyes, or preservatives -pregnant or trying to get pregnant -breast-feeding How should I use this medicine? This medicine is for infusion into a vein. It is given by a health care professional in a hospital or clinic setting. Talk to your pediatrician regarding the use of this medicine in children. Special care may be needed. Overdosage: If you think you have taken too much of this medicine contact a poison control center or emergency room at once. NOTE: This medicine is only for you. Do not share this medicine with others. What if I miss a dose? It is important not to miss your dose. Call your doctor or health care professional if you are unable to keep an appointment. What may interact with this medicine? -certain antibiotics given by injection -NSAIDs, medicines for pain and inflammation, like ibuprofen or naproxen -some diuretics like bumetanide, furosemide -teriparatide -thalidomide This list may not describe all possible interactions. Give your health care provider a list of all the medicines, herbs, non-prescription drugs, or dietary supplements you  use. Also tell them if you smoke, drink alcohol, or use illegal drugs. Some items may interact with your medicine. What should I watch for while using this medicine? Visit your doctor or health care professional for regular checkups. It may be some time before you see the benefit from this medicine. Do not stop taking your medicine unless your doctor tells you to. Your doctor may order blood tests or other tests to see how you are doing. Women should inform their doctor if they wish to become pregnant or think they might be pregnant. There is a potential for serious side effects to an unborn child. Talk to your health care professional or pharmacist for more information. You should make sure that you get enough calcium and vitamin D while you are taking this medicine. Discuss the foods you eat and the vitamins you take with your health care professional. Some people who take this medicine have severe bone, joint, and/or muscle pain. This medicine may also increase your risk for jaw problems or a broken thigh bone. Tell your doctor right away if you have severe pain in your jaw, bones, joints, or muscles. Tell your doctor if you have any pain that does not go away or that gets worse. Tell your dentist and dental surgeon that you are taking this medicine. You should not have major dental surgery while on this medicine. See your dentist to have a dental exam and fix any dental problems before starting this medicine. Take good care of your teeth while on this medicine. Make sure you see your dentist for regular follow-up appointments. What side effects may I notice from receiving this medicine? Side effects that you should report   to your doctor or health care professional as soon as possible: -allergic reactions like skin rash, itching or hives, swelling of the face, lips, or tongue -anxiety, confusion, or depression -breathing problems -changes in vision -eye pain -feeling faint or lightheaded,  falls -jaw pain, especially after dental work -mouth sores -muscle cramps, stiffness, or weakness -redness, blistering, peeling or loosening of the skin, including inside the mouth -trouble passing urine or change in the amount of urine Side effects that usually do not require medical attention (report to your doctor or health care professional if they continue or are bothersome): -bone, joint, or muscle pain -constipation -diarrhea -fever -hair loss -irritation at site where injected -loss of appetite -nausea, vomiting -stomach upset -trouble sleeping -trouble swallowing -weak or tired This list may not describe all possible side effects. Call your doctor for medical advice about side effects. You may report side effects to FDA at 1-800-FDA-1088. Where should I keep my medicine? This drug is given in a hospital or clinic and will not be stored at home. NOTE: This sheet is a summary. It may not cover all possible information. If you have questions about this medicine, talk to your doctor, pharmacist, or health care provider.    2016, Elsevier/Gold Standard. (2013-07-18 14:19:39)   Neutropenia Neutropenia is a condition that occurs when the level of a certain type of white blood cell (neutrophil) in your body becomes lower than normal. Neutrophils are made in the bone marrow and fight infections. These cells protect against bacteria and viruses. The fewer neutrophils you have, and the longer your body remains without them, the greater your risk of getting a severe infection becomes. CAUSES  The cause of neutropenia may be hard to determine. However, it is usually due to 3 main problems:   Decreased production of neutrophils. This may be due to:  Certain medicines such as chemotherapy.  Genetic problems.  Cancer.  Radiation treatments.  Vitamin deficiency.  Some pesticides.  Increased destruction of neutrophils. This may be due to:  Overwhelming infections.  Hemolytic  anemia. This is when the body destroys its own blood cells.  Chemotherapy.  Neutrophils moving to areas of the body where they cannot fight infections. This may be due to:  Dialysis procedures.  Conditions where the spleen becomes enlarged. Neutrophils are held in the spleen and are not available to the rest of the body.  Overwhelming infections. The neutrophils are held in the area of the infection and are not available to the rest of the body. SYMPTOMS  There are no specific symptoms of neutropenia. The lack of neutrophils can result in an infection, and an infection can cause various problems. DIAGNOSIS  Diagnosis is made by a blood test. A complete blood count is performed. The normal level of neutrophils in human blood differs with age and race. Infants have lower counts than older children and adults. African Americans have lower counts than Caucasians or Asians. The average adult level is 1500 cells/mm3 of blood. Neutrophil counts are interpreted as follows:  Greater than 1000 cells/mm3 gives normal protection against infection.  500 to 1000 cells/mm3 gives an increased risk for infection.  200 to 500 cells/mm3 is a greater risk for severe infection.  Lower than 200 cells/mm3 is a marked risk of infection. This may require hospitalization and treatment with antibiotic medicines. TREATMENT  Treatment depends on the underlying cause, severity, and presence of infections or symptoms. It also depends on your health. Your caregiver will discuss the treatment plan with you.   Mild cases are often easily treated and have a good outcome. Preventative measures may also be started to limit your risk of infections. Treatment can include:  Taking antibiotics.  Stopping medicines that are known to cause neutropenia.  Correcting nutritional deficiencies by eating green vegetables to supply folic acid and taking vitamin B supplements.  Stopping exposure to pesticides if your neutropenia is  related to pesticide exposure.  Taking a blood growth factor called sargramostim, pegfilgrastim, or filgrastim if you are undergoing chemotherapy for cancer. This stimulates white blood cell production.  Removal of the spleen if you have Felty's syndrome and have repeated infections. HOME CARE INSTRUCTIONS   Follow your caregiver's instructions about when you need to have blood work done.  Wash your hands often. Make sure others who come in contact with you also wash their hands.  Wash raw fruits and vegetables before eating them. They can carry bacteria and fungi.  Avoid people with colds or spreadable (contagious) diseases (chickenpox, herpes zoster, influenza).  Avoid large crowds.  Avoid construction areas. The dust can release fungus into the air.  Be cautious around children in daycare or school environments.  Take care of your respiratory system by coughing and deep breathing.  Bathe daily.  Protect your skin from cuts and burns.  Do not work in the garden or with flowers and plants.  Care for the mouth before and after meals by brushing with a soft toothbrush. If you have mucositis, do not use mouthwash. Mouthwash contains alcohol and can dry out the mouth even more.  Clean the area between the genitals and the anus (perineal area) after urination and bowel movements. Women need to wipe from front to back.  Use a water soluble lubricant during sexual intercourse and practice good hygiene after. Do not have intercourse if you are severely neutropenic. Check with your caregiver for guidelines.  Exercise daily as tolerated.  Avoid people who were vaccinated with a live vaccine in the past 30 days. You should not receive live vaccines (polio, typhoid).  Do not provide direct care for pets. Avoid animal droppings. Do not clean litter boxes and bird cages.  Do not share food utensils.  Do not use tampons, enemas, or rectal suppositories unless directed by your  caregiver.  Use an electric razor to remove hair.  Wash your hands after handling magazines, letters, and newspapers. SEEK IMMEDIATE MEDICAL CARE IF:   You have a fever.  You have chills or start to shake.  You feel nauseous or vomit.  You develop mouth sores.  You develop aches and pains.  You have redness and swelling around open wounds.  Your skin is warm to the touch.  You have pus coming from your wounds.  You develop swollen lymph nodes.  You feel weak or fatigued.  You develop red streaks on the skin. MAKE SURE YOU:  Understand these instructions.  Will watch your condition.  Will get help right away if you are not doing well or get worse.   This information is not intended to replace advice given to you by your health care provider. Make sure you discuss any questions you have with your health care provider.   Document Released: 08/11/2001 Document Revised: 05/14/2011 Document Reviewed: 09/01/2014 Elsevier Interactive Patient Education 2016 Elsevier Inc.  

## 2015-09-16 LAB — KAPPA/LAMBDA LIGHT CHAINS
IG KAPPA FREE LIGHT CHAIN: 105.3 mg/L — AB (ref 3.3–19.4)
IG LAMBDA FREE LIGHT CHAIN: 65.8 mg/L — AB (ref 5.7–26.3)
KAPPA/LAMBDA FLC RATIO: 1.6 (ref 0.26–1.65)

## 2015-09-16 LAB — IGG, IGA, IGM
IGA/IMMUNOGLOBULIN A, SERUM: 612 mg/dL — AB (ref 87–352)
IGM (IMMUNOGLOBIN M), SRM: 38 mg/dL (ref 26–217)
IgG, Qn, Serum: 1545 mg/dL (ref 700–1600)

## 2015-09-29 ENCOUNTER — Other Ambulatory Visit: Payer: BC Managed Care – PPO

## 2015-09-30 ENCOUNTER — Other Ambulatory Visit (HOSPITAL_BASED_OUTPATIENT_CLINIC_OR_DEPARTMENT_OTHER): Payer: BC Managed Care – PPO

## 2015-09-30 ENCOUNTER — Telehealth: Payer: Self-pay

## 2015-09-30 DIAGNOSIS — C9001 Multiple myeloma in remission: Secondary | ICD-10-CM | POA: Diagnosis not present

## 2015-09-30 LAB — CBC WITH DIFFERENTIAL/PLATELET
BASO%: 0.6 % (ref 0.0–2.0)
BASOS ABS: 0 10*3/uL (ref 0.0–0.1)
EOS%: 3.7 % (ref 0.0–7.0)
Eosinophils Absolute: 0.1 10*3/uL (ref 0.0–0.5)
HEMATOCRIT: 36.1 % (ref 34.8–46.6)
HEMOGLOBIN: 11.8 g/dL (ref 11.6–15.9)
LYMPH#: 1 10*3/uL (ref 0.9–3.3)
LYMPH%: 40.7 % (ref 14.0–49.7)
MCH: 28.8 pg (ref 25.1–34.0)
MCHC: 32.6 g/dL (ref 31.5–36.0)
MCV: 88.2 fL (ref 79.5–101.0)
MONO#: 0.4 10*3/uL (ref 0.1–0.9)
MONO%: 17.8 % — AB (ref 0.0–14.0)
NEUT#: 0.9 10*3/uL — ABNORMAL LOW (ref 1.5–6.5)
NEUT%: 37.2 % — ABNORMAL LOW (ref 38.4–76.8)
Platelets: 211 10*3/uL (ref 145–400)
RBC: 4.1 10*6/uL (ref 3.70–5.45)
RDW: 18 % — AB (ref 11.2–14.5)
WBC: 2.4 10*3/uL — AB (ref 3.9–10.3)

## 2015-09-30 LAB — BASIC METABOLIC PANEL
Anion Gap: 9 mEq/L (ref 3–11)
BUN: 14.2 mg/dL (ref 7.0–26.0)
CALCIUM: 8.7 mg/dL (ref 8.4–10.4)
CO2: 22 meq/L (ref 22–29)
CREATININE: 1.1 mg/dL (ref 0.6–1.1)
Chloride: 110 mEq/L — ABNORMAL HIGH (ref 98–109)
EGFR: 65 mL/min/{1.73_m2} — AB (ref >90)
GLUCOSE: 93 mg/dL (ref 70–140)
POTASSIUM: 3.5 meq/L (ref 3.5–5.1)
SODIUM: 141 meq/L (ref 136–145)

## 2015-09-30 NOTE — Telephone Encounter (Signed)
Called and informed pt of WBC results and to continue revlemid, informed pt to call if she develops any fevers. Pt verbalized understanding and asked about her potassium result. Informed her it was 3.5 and in normal range. Pt appreciative of call and will call with any further questions or concerns.

## 2015-09-30 NOTE — Telephone Encounter (Signed)
-----   Message from Ladell Pier, MD sent at 09/30/2015  8:39 AM EDT ----- Please call patient Carrie Huber count is better, continue revlimid, call for fever

## 2015-10-06 ENCOUNTER — Other Ambulatory Visit: Payer: Self-pay | Admitting: *Deleted

## 2015-10-06 DIAGNOSIS — C9000 Multiple myeloma not having achieved remission: Secondary | ICD-10-CM

## 2015-10-06 MED ORDER — LENALIDOMIDE 5 MG PO CAPS: 5.0000 mg | ORAL_CAPSULE | Freq: Every day | ORAL | 0 refills | Status: DC

## 2015-11-02 ENCOUNTER — Telehealth: Payer: Self-pay

## 2015-11-02 NOTE — Telephone Encounter (Signed)
Faxed revlimid rx to [facility].

## 2015-11-04 ENCOUNTER — Other Ambulatory Visit: Payer: Self-pay | Admitting: *Deleted

## 2015-11-04 DIAGNOSIS — C9000 Multiple myeloma not having achieved remission: Secondary | ICD-10-CM

## 2015-11-04 MED ORDER — LENALIDOMIDE 5 MG PO CAPS: 5.0000 mg | ORAL_CAPSULE | Freq: Every day | ORAL | 0 refills | Status: DC

## 2015-11-29 ENCOUNTER — Telehealth: Payer: Self-pay

## 2015-11-30 ENCOUNTER — Other Ambulatory Visit: Payer: Self-pay | Admitting: *Deleted

## 2015-11-30 DIAGNOSIS — C9000 Multiple myeloma not having achieved remission: Secondary | ICD-10-CM

## 2015-11-30 MED ORDER — LENALIDOMIDE 5 MG PO CAPS: 5.0000 mg | ORAL_CAPSULE | Freq: Every day | ORAL | 0 refills | Status: DC

## 2015-11-30 NOTE — Telephone Encounter (Signed)
Revlimid refill request received from [facility]. Reviewed with Dr. Benay Spice: No labs needed, prescription faxed.

## 2015-12-12 ENCOUNTER — Telehealth: Payer: Self-pay | Admitting: Nurse Practitioner

## 2015-12-12 ENCOUNTER — Telehealth: Payer: Self-pay | Admitting: *Deleted

## 2015-12-12 NOTE — Telephone Encounter (Signed)
Per staff message from scheduler I have moved appt from 10/26 to 10/27. Notified the scheduler

## 2015-12-12 NOTE — Telephone Encounter (Signed)
10/26 Appointment rescheduled to 10/27 per patient request. 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

## 2015-12-29 ENCOUNTER — Other Ambulatory Visit: Payer: BC Managed Care – PPO

## 2015-12-29 ENCOUNTER — Ambulatory Visit: Payer: BC Managed Care – PPO

## 2015-12-29 ENCOUNTER — Ambulatory Visit: Payer: BC Managed Care – PPO | Admitting: Nurse Practitioner

## 2015-12-29 ENCOUNTER — Ambulatory Visit: Payer: BC Managed Care – PPO | Admitting: Oncology

## 2015-12-30 ENCOUNTER — Telehealth: Payer: Self-pay | Admitting: *Deleted

## 2015-12-30 ENCOUNTER — Other Ambulatory Visit: Payer: Self-pay

## 2015-12-30 ENCOUNTER — Telehealth: Payer: Self-pay | Admitting: Nurse Practitioner

## 2015-12-30 ENCOUNTER — Ambulatory Visit (HOSPITAL_BASED_OUTPATIENT_CLINIC_OR_DEPARTMENT_OTHER): Payer: BC Managed Care – PPO | Admitting: Nurse Practitioner

## 2015-12-30 ENCOUNTER — Ambulatory Visit (HOSPITAL_BASED_OUTPATIENT_CLINIC_OR_DEPARTMENT_OTHER): Payer: BC Managed Care – PPO

## 2015-12-30 ENCOUNTER — Other Ambulatory Visit (HOSPITAL_BASED_OUTPATIENT_CLINIC_OR_DEPARTMENT_OTHER): Payer: BC Managed Care – PPO

## 2015-12-30 VITALS — BP 180/79 | HR 78 | Temp 98.6°F | Resp 18 | Ht 65.5 in | Wt 207.2 lb

## 2015-12-30 VITALS — BP 160/86 | HR 70 | Resp 18

## 2015-12-30 DIAGNOSIS — Z86711 Personal history of pulmonary embolism: Secondary | ICD-10-CM

## 2015-12-30 DIAGNOSIS — C9000 Multiple myeloma not having achieved remission: Secondary | ICD-10-CM

## 2015-12-30 DIAGNOSIS — R000 Tachycardia, unspecified: Secondary | ICD-10-CM

## 2015-12-30 DIAGNOSIS — C9001 Multiple myeloma in remission: Secondary | ICD-10-CM | POA: Diagnosis not present

## 2015-12-30 DIAGNOSIS — D701 Agranulocytosis secondary to cancer chemotherapy: Secondary | ICD-10-CM | POA: Diagnosis not present

## 2015-12-30 DIAGNOSIS — Z23 Encounter for immunization: Secondary | ICD-10-CM

## 2015-12-30 LAB — CBC WITH DIFFERENTIAL/PLATELET
BASO%: 1 % (ref 0.0–2.0)
Basophils Absolute: 0 10*3/uL (ref 0.0–0.1)
EOS%: 4.9 % (ref 0.0–7.0)
Eosinophils Absolute: 0.1 10*3/uL (ref 0.0–0.5)
HCT: 36 % (ref 34.8–46.6)
HGB: 11.7 g/dL (ref 11.6–15.9)
LYMPH%: 42.8 % (ref 14.0–49.7)
MCH: 28.8 pg (ref 25.1–34.0)
MCHC: 32.3 g/dL (ref 31.5–36.0)
MCV: 89.2 fL (ref 79.5–101.0)
MONO#: 0.4 10*3/uL (ref 0.1–0.9)
MONO%: 19 % — AB (ref 0.0–14.0)
NEUT#: 0.7 10*3/uL — ABNORMAL LOW (ref 1.5–6.5)
NEUT%: 32.3 % — AB (ref 38.4–76.8)
Platelets: 200 10*3/uL (ref 145–400)
RBC: 4.04 10*6/uL (ref 3.70–5.45)
RDW: 18.2 % — ABNORMAL HIGH (ref 11.2–14.5)
WBC: 2 10*3/uL — ABNORMAL LOW (ref 3.9–10.3)
lymph#: 0.9 10*3/uL (ref 0.9–3.3)

## 2015-12-30 LAB — BASIC METABOLIC PANEL
ANION GAP: 6 meq/L (ref 3–11)
BUN: 13.4 mg/dL (ref 7.0–26.0)
CO2: 26 mEq/L (ref 22–29)
Calcium: 8.8 mg/dL (ref 8.4–10.4)
Chloride: 108 mEq/L (ref 98–109)
Creatinine: 1 mg/dL (ref 0.6–1.1)
EGFR: 73 mL/min/{1.73_m2} — AB (ref >90)
Glucose: 89 mg/dl (ref 70–140)
POTASSIUM: 3.5 meq/L (ref 3.5–5.1)
SODIUM: 140 meq/L (ref 136–145)

## 2015-12-30 MED ORDER — ZOLEDRONIC ACID 4 MG/100ML IV SOLN
4.0000 mg | Freq: Once | INTRAVENOUS | Status: AC
  Administered 2015-12-30: 4 mg via INTRAVENOUS
  Filled 2015-12-30: qty 100

## 2015-12-30 MED ORDER — INFLUENZA VAC SPLIT QUAD 0.5 ML IM SUSY
0.5000 mL | PREFILLED_SYRINGE | Freq: Once | INTRAMUSCULAR | Status: AC
  Administered 2015-12-30: 0.5 mL via INTRAMUSCULAR
  Filled 2015-12-30: qty 0.5

## 2015-12-30 MED ORDER — LENALIDOMIDE 5 MG PO CAPS: 5.0000 mg | ORAL_CAPSULE | Freq: Every day | ORAL | 0 refills | Status: DC

## 2015-12-30 NOTE — Patient Instructions (Signed)
Influenza Virus Vaccine injection What is this medicine? INFLUENZA VIRUS VACCINE (in floo EN zuh VAHY ruhs vak SEEN) helps to reduce the risk of getting influenza also known as the flu. The vaccine only helps protect you against some strains of the flu. This medicine may be used for other purposes; ask your health care provider or pharmacist if you have questions. What should I tell my health care provider before I take this medicine? They need to know if you have any of these conditions: -bleeding disorder like hemophilia -fever or infection -Guillain-Barre syndrome or other neurological problems -immune system problems -infection with the human immunodeficiency virus (HIV) or AIDS -low blood platelet counts -multiple sclerosis -an unusual or allergic reaction to influenza virus vaccine, latex, other medicines, foods, dyes, or preservatives. Different brands of vaccines contain different allergens. Some may contain latex or eggs. Talk to your doctor about your allergies to make sure that you get the right vaccine. -pregnant or trying to get pregnant -breast-feeding How should I use this medicine? This vaccine is for injection into a muscle or under the skin. It is given by a health care professional. A copy of Vaccine Information Statements will be given before each vaccination. Read this sheet carefully each time. The sheet may change frequently. Talk to your healthcare provider to see which vaccines are right for you. Some vaccines should not be used in all age groups. Overdosage: If you think you have taken too much of this medicine contact a poison control center or emergency room at once. NOTE: This medicine is only for you. Do not share this medicine with others. What if I miss a dose? This does not apply. What may interact with this medicine? -chemotherapy or radiation therapy -medicines that lower your immune system like etanercept, anakinra, infliximab, and adalimumab -medicines  that treat or prevent blood clots like warfarin -phenytoin -steroid medicines like prednisone or cortisone -theophylline -vaccines This list may not describe all possible interactions. Give your health care provider a list of all the medicines, herbs, non-prescription drugs, or dietary supplements you use. Also tell them if you smoke, drink alcohol, or use illegal drugs. Some items may interact with your medicine. What should I watch for while using this medicine? Report any side effects that do not go away within 3 days to your doctor or health care professional. Call your health care provider if any unusual symptoms occur within 6 weeks of receiving this vaccine. You may still catch the flu, but the illness is not usually as bad. You cannot get the flu from the vaccine. The vaccine will not protect against colds or other illnesses that may cause fever. The vaccine is needed every year. What side effects may I notice from receiving this medicine? Side effects that you should report to your doctor or health care professional as soon as possible: -allergic reactions like skin rash, itching or hives, swelling of the face, lips, or tongue Side effects that usually do not require medical attention (report to your doctor or health care professional if they continue or are bothersome): -fever -headache -muscle aches and pains -pain, tenderness, redness, or swelling at the injection site -tiredness This list may not describe all possible side effects. Call your doctor for medical advice about side effects. You may report side effects to FDA at 1-800-FDA-1088. Where should I keep my medicine? The vaccine will be given by a health care professional in a clinic, pharmacy, doctor's office, or other health care setting. You will not   be given vaccine doses to store at home. NOTE: This sheet is a summary. It may not cover all possible information. If you have questions about this medicine, talk to your  doctor, pharmacist, or health care provider.    2016, Elsevier/Gold Standard. (2014-09-10 10:07:28)   Zoledronic Acid injection (Hypercalcemia, Oncology) What is this medicine? ZOLEDRONIC ACID (ZOE le dron ik AS id) lowers the amount of calcium loss from bone. It is used to treat too much calcium in your blood from cancer. It is also used to prevent complications of cancer that has spread to the bone. This medicine may be used for other purposes; ask your health care provider or pharmacist if you have questions. What should I tell my health care provider before I take this medicine? They need to know if you have any of these conditions: -aspirin-sensitive asthma -cancer, especially if you are receiving medicines used to treat cancer -dental disease or wear dentures -infection -kidney disease -receiving corticosteroids like dexamethasone or prednisone -an unusual or allergic reaction to zoledronic acid, other medicines, foods, dyes, or preservatives -pregnant or trying to get pregnant -breast-feeding How should I use this medicine? This medicine is for infusion into a vein. It is given by a health care professional in a hospital or clinic setting. Talk to your pediatrician regarding the use of this medicine in children. Special care may be needed. Overdosage: If you think you have taken too much of this medicine contact a poison control center or emergency room at once. NOTE: This medicine is only for you. Do not share this medicine with others. What if I miss a dose? It is important not to miss your dose. Call your doctor or health care professional if you are unable to keep an appointment. What may interact with this medicine? -certain antibiotics given by injection -NSAIDs, medicines for pain and inflammation, like ibuprofen or naproxen -some diuretics like bumetanide, furosemide -teriparatide -thalidomide This list may not describe all possible interactions. Give your health care  provider a list of all the medicines, herbs, non-prescription drugs, or dietary supplements you use. Also tell them if you smoke, drink alcohol, or use illegal drugs. Some items may interact with your medicine. What should I watch for while using this medicine? Visit your doctor or health care professional for regular checkups. It may be some time before you see the benefit from this medicine. Do not stop taking your medicine unless your doctor tells you to. Your doctor may order blood tests or other tests to see how you are doing. Women should inform their doctor if they wish to become pregnant or think they might be pregnant. There is a potential for serious side effects to an unborn child. Talk to your health care professional or pharmacist for more information. You should make sure that you get enough calcium and vitamin D while you are taking this medicine. Discuss the foods you eat and the vitamins you take with your health care professional. Some people who take this medicine have severe bone, joint, and/or muscle pain. This medicine may also increase your risk for jaw problems or a broken thigh bone. Tell your doctor right away if you have severe pain in your jaw, bones, joints, or muscles. Tell your doctor if you have any pain that does not go away or that gets worse. Tell your dentist and dental surgeon that you are taking this medicine. You should not have major dental surgery while on this medicine. See your dentist to have a  dental exam and fix any dental problems before starting this medicine. Take good care of your teeth while on this medicine. Make sure you see your dentist for regular follow-up appointments. What side effects may I notice from receiving this medicine? Side effects that you should report to your doctor or health care professional as soon as possible: -allergic reactions like skin rash, itching or hives, swelling of the face, lips, or tongue -anxiety, confusion, or  depression -breathing problems -changes in vision -eye pain -feeling faint or lightheaded, falls -jaw pain, especially after dental work -mouth sores -muscle cramps, stiffness, or weakness -redness, blistering, peeling or loosening of the skin, including inside the mouth -trouble passing urine or change in the amount of urine Side effects that usually do not require medical attention (report to your doctor or health care professional if they continue or are bothersome): -bone, joint, or muscle pain -constipation -diarrhea -fever -hair loss -irritation at site where injected -loss of appetite -nausea, vomiting -stomach upset -trouble sleeping -trouble swallowing -weak or tired This list may not describe all possible side effects. Call your doctor for medical advice about side effects. You may report side effects to FDA at 1-800-FDA-1088. Where should I keep my medicine? This drug is given in a hospital or clinic and will not be stored at home. NOTE: This sheet is a summary. It may not cover all possible information. If you have questions about this medicine, talk to your doctor, pharmacist, or health care provider.    2016, Elsevier/Gold Standard. (2013-07-18 14:19:39)

## 2015-12-30 NOTE — Telephone Encounter (Signed)
Per LOS I have scheduled appts and notified the scheduler 

## 2015-12-30 NOTE — Telephone Encounter (Signed)
Appointments scheduled per 10/27 LOS. Patient given AVS and calendar of future scheduled appointments. Patient informed about bone survey.

## 2015-12-30 NOTE — Progress Notes (Signed)
  [place] OFFICE PROGRESS NOTE   Diagnosis:  Multiple myeloma  INTERVAL HISTORY:   Carrie Huber returns as scheduled. She continues maintenance Revlimid and every 3 month Zometa. She denies nausea/vomiting. She has occasional loose stools which she relates to Revlimid. No numbness or tingling in her hands or feet. She occasionally notes an abnormal sensation in the feet. No back pain. No interim illnesses or infections.  Objective:  Vital signs in last 24 hours:  Blood pressure (!) 180/79, pulse 78, temperature 98.6 F (37 C), temperature source Oral, resp. rate 18, height 5' 5.5" (1.664 m), weight 207 lb 3.2 oz (94 kg), SpO2 99 %.    HEENT: No thrush or ulcers. Lymphatics: No palpable cervical or supraclavicular lymph nodes. Resp: Lungs clear bilaterally. Cardio: Regular rate and rhythm. GI: Abdomen soft and nontender. No organomegaly. Vascular: Trace pitting edema at the lower legs/feet bilaterally left slightly greater than right.    Lab Results:  Lab Results  Component Value Date   WBC 2.0 (L) 12/30/2015   HGB 11.7 12/30/2015   HCT 36.0 12/30/2015   MCV 89.2 12/30/2015   PLT 200 12/30/2015   NEUTROABS 0.7 (L) 12/30/2015    Imaging:  No results found.  Medications: I have reviewed the patient's current medications.  Assessment/Plan: 1. Multiple myeloma, IgG kappa, status post 7 cycles of Revlimid/Decadron and Velcade with marked clinical and laboratory improvement. She is status post melphalan conditioning, followed by an autologous stem cell infusion 09/10/2008. She began maintenance Revlimid 01/04/2009. She has persistent mild elevation of the kappa and lambda light chains 2. Neutropenia while on Revlimid at a dose of 10 mg daily. The Revlimid dose was reduced to 5 mg in September 2011 due to persistent neutropenia and a hospital admission with pneumonia/sepsis. neutropenia persists. 3. Admission 11/03/2009 with left lung pneumonia and sepsis  syndrome. No specific organism was cultured during the hospital admission. She completed outpatient Levaquin therapy.  4. 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  5. Thoracic spine compression fracture, status post a kyphoplasty 12/05/2007. A metastatic bone survey was unchanged on 12/01/2013. 6. Tiny pulmonary embolism on a CT 11/20/2007, now maintained off of Coumadin.  7. Chronic tachycardia on repeat office visits at the [facility].  8. History of hypertension.   Disposition:Carrie Huber remains in clinical remission from multiple myeloma. We will follow-up on the serum light chains from today. She will continue maintenance Revlimid and every 3 month Zometa.   She has persistent moderate neutropenia which is likely secondary to Revlimid. She understands to contact the office with fever, chills, other signs of infection. She will receive the influenza vaccine today. She is up-to-date on the pneumococcal vaccines.  We are referring her for a restaging bone survey in the next few weeks.  She will return in 3 months for labs and Zometa. We will schedule a follow-up visit, labs and Zometa in 6 months. She will contact the office in the interim as outlined above or with any other problems.   Ned Card ANP/GNP-BC   12/30/2015  9:58 AM

## 2016-01-02 LAB — KAPPA/LAMBDA LIGHT CHAINS
IG KAPPA FREE LIGHT CHAIN: 101 mg/L — AB (ref 3.3–19.4)
IG LAMBDA FREE LIGHT CHAIN: 65 mg/L — AB (ref 5.7–26.3)
Kappa/Lambda FluidC Ratio: 1.55 (ref 0.26–1.65)

## 2016-01-03 LAB — MULTIPLE MYELOMA PANEL, SERUM
Albumin SerPl Elph-Mcnc: 3 g/dL (ref 2.9–4.4)
Albumin/Glob SerPl: 0.8 (ref 0.7–1.7)
Alpha 1: 0.2 g/dL (ref 0.0–0.4)
Alpha2 Glob SerPl Elph-Mcnc: 0.8 g/dL (ref 0.4–1.0)
B-Globulin SerPl Elph-Mcnc: 1.3 g/dL (ref 0.7–1.3)
Gamma Glob SerPl Elph-Mcnc: 1.7 g/dL (ref 0.4–1.8)
Globulin, Total: 4 g/dL — ABNORMAL HIGH (ref 2.2–3.9)
IGA/IMMUNOGLOBULIN A, SERUM: 640 mg/dL — AB (ref 87–352)
IGM (IMMUNOGLOBIN M), SRM: 39 mg/dL (ref 26–217)
IgG, Qn, Serum: 1634 mg/dL — ABNORMAL HIGH (ref 700–1600)
TOTAL PROTEIN: 7 g/dL (ref 6.0–8.5)

## 2016-01-25 ENCOUNTER — Other Ambulatory Visit: Payer: Self-pay | Admitting: *Deleted

## 2016-01-25 DIAGNOSIS — C9000 Multiple myeloma not having achieved remission: Secondary | ICD-10-CM

## 2016-01-25 MED ORDER — LENALIDOMIDE 5 MG PO CAPS: 5.0000 mg | ORAL_CAPSULE | Freq: Every day | ORAL | 0 refills | Status: DC

## 2016-02-22 ENCOUNTER — Other Ambulatory Visit: Payer: Self-pay | Admitting: *Deleted

## 2016-02-22 DIAGNOSIS — C9000 Multiple myeloma not having achieved remission: Secondary | ICD-10-CM

## 2016-02-22 MED ORDER — LENALIDOMIDE 5 MG PO CAPS: 5.0000 mg | ORAL_CAPSULE | Freq: Every day | ORAL | 0 refills | Status: DC

## 2016-03-21 ENCOUNTER — Other Ambulatory Visit: Payer: Self-pay | Admitting: *Deleted

## 2016-03-21 DIAGNOSIS — C9000 Multiple myeloma not having achieved remission: Secondary | ICD-10-CM

## 2016-03-21 MED ORDER — LENALIDOMIDE 5 MG PO CAPS: 5.0000 mg | ORAL_CAPSULE | Freq: Every day | ORAL | 0 refills | Status: DC

## 2016-03-27 ENCOUNTER — Other Ambulatory Visit: Payer: Self-pay | Admitting: *Deleted

## 2016-03-28 ENCOUNTER — Other Ambulatory Visit (HOSPITAL_BASED_OUTPATIENT_CLINIC_OR_DEPARTMENT_OTHER): Payer: BC Managed Care – PPO

## 2016-03-28 ENCOUNTER — Ambulatory Visit (HOSPITAL_BASED_OUTPATIENT_CLINIC_OR_DEPARTMENT_OTHER): Payer: BC Managed Care – PPO

## 2016-03-28 VITALS — BP 146/86 | HR 75 | Temp 98.3°F | Resp 18

## 2016-03-28 DIAGNOSIS — C9000 Multiple myeloma not having achieved remission: Secondary | ICD-10-CM

## 2016-03-28 DIAGNOSIS — C9001 Multiple myeloma in remission: Secondary | ICD-10-CM

## 2016-03-28 LAB — COMPREHENSIVE METABOLIC PANEL
ALT: 13 U/L (ref 0–55)
AST: 24 U/L (ref 5–34)
Albumin: 3.2 g/dL — ABNORMAL LOW (ref 3.5–5.0)
Alkaline Phosphatase: 76 U/L (ref 40–150)
Anion Gap: 9 mEq/L (ref 3–11)
BUN: 11.5 mg/dL (ref 7.0–26.0)
CHLORIDE: 105 meq/L (ref 98–109)
CO2: 26 mEq/L (ref 22–29)
Calcium: 9.1 mg/dL (ref 8.4–10.4)
Creatinine: 1.1 mg/dL (ref 0.6–1.1)
EGFR: 63 mL/min/{1.73_m2} — AB (ref >90)
GLUCOSE: 95 mg/dL (ref 70–140)
POTASSIUM: 3.6 meq/L (ref 3.5–5.1)
SODIUM: 140 meq/L (ref 136–145)
Total Bilirubin: 0.84 mg/dL (ref 0.20–1.20)
Total Protein: 7.9 g/dL (ref 6.4–8.3)

## 2016-03-28 LAB — CBC WITH DIFFERENTIAL/PLATELET
BASO%: 0.9 % (ref 0.0–2.0)
BASOS ABS: 0 10*3/uL (ref 0.0–0.1)
EOS%: 3.7 % (ref 0.0–7.0)
Eosinophils Absolute: 0.1 10*3/uL (ref 0.0–0.5)
HCT: 35.6 % (ref 34.8–46.6)
HGB: 11.6 g/dL (ref 11.6–15.9)
LYMPH%: 45.4 % (ref 14.0–49.7)
MCH: 29 pg (ref 25.1–34.0)
MCHC: 32.6 g/dL (ref 31.5–36.0)
MCV: 89 fL (ref 79.5–101.0)
MONO#: 0.3 10*3/uL (ref 0.1–0.9)
MONO%: 14.7 % — AB (ref 0.0–14.0)
NEUT#: 0.8 10*3/uL — ABNORMAL LOW (ref 1.5–6.5)
NEUT%: 35.3 % — AB (ref 38.4–76.8)
Platelets: 177 10*3/uL (ref 145–400)
RBC: 4 10*6/uL (ref 3.70–5.45)
RDW: 17.1 % — AB (ref 11.2–14.5)
WBC: 2.2 10*3/uL — ABNORMAL LOW (ref 3.9–10.3)
lymph#: 1 10*3/uL (ref 0.9–3.3)

## 2016-03-28 MED ORDER — SODIUM CHLORIDE 0.9 % IV SOLN
Freq: Once | INTRAVENOUS | Status: AC
  Administered 2016-03-28: 10:00:00 via INTRAVENOUS

## 2016-03-28 MED ORDER — ZOLEDRONIC ACID 4 MG/100ML IV SOLN
4.0000 mg | Freq: Once | INTRAVENOUS | Status: AC
  Administered 2016-03-28: 4 mg via INTRAVENOUS
  Filled 2016-03-28: qty 100

## 2016-03-28 NOTE — Patient Instructions (Signed)
Zoledronic Acid injection (Hypercalcemia, Oncology) ([facility]) What is this medicine? ZOLEDRONIC ACID (ZOE le dron ik AS id) lowers the amount of calcium loss from bone. It is used to treat too much calcium in your blood from cancer. It is also used to prevent complications of cancer that has spread to the bone. This medicine may be used for other purposes; ask your health care provider or pharmacist if you have questions. What should I tell my health care provider before I take this medicine? They need to know if you have any of these conditions: -aspirin-sensitive asthma -cancer, especially if you are receiving medicines used to treat cancer -dental disease or wear dentures -infection -kidney disease -receiving corticosteroids like dexamethasone or prednisone -an unusual or allergic reaction to zoledronic acid, other medicines, foods, dyes, or preservatives -pregnant or trying to get pregnant -breast-feeding How should I use this medicine? This medicine is for infusion into a vein. It is given by a health care professional in a hospital or clinic setting. Talk to your pediatrician regarding the use of this medicine in children. Special care may be needed. Overdosage: If you think you have taken too much of this medicine contact a poison control center or emergency room at once. NOTE: This medicine is only for you. Do not share this medicine with others. What if I miss a dose? It is important not to miss your dose. Call your doctor or health care professional if you are unable to keep an appointment. What may interact with this medicine? -certain antibiotics given by injection -NSAIDs, medicines for pain and inflammation, like ibuprofen or naproxen -some diuretics like bumetanide, furosemide -teriparatide -thalidomide This list may not describe all possible interactions. Give your health care provider a list of all the medicines, herbs, non-prescription drugs, or dietary supplements you  use. Also tell them if you smoke, drink alcohol, or use illegal drugs. Some items may interact with your medicine. What should I watch for while using this medicine? Visit your doctor or health care professional for regular checkups. It may be some time before you see the benefit from this medicine. Do not stop taking your medicine unless your doctor tells you to. Your doctor may order blood tests or other tests to see how you are doing. Women should inform their doctor if they wish to become pregnant or think they might be pregnant. There is a potential for serious side effects to an unborn child. Talk to your health care professional or pharmacist for more information. You should make sure that you get enough calcium and vitamin D while you are taking this medicine. Discuss the foods you eat and the vitamins you take with your health care professional. Some people who take this medicine have severe bone, joint, and/or muscle pain. This medicine may also increase your risk for jaw problems or a broken thigh bone. Tell your doctor right away if you have severe pain in your jaw, bones, joints, or muscles. Tell your doctor if you have any pain that does not go away or that gets worse. Tell your dentist and dental surgeon that you are taking this medicine. You should not have major dental surgery while on this medicine. See your dentist to have a dental exam and fix any dental problems before starting this medicine. Take good care of your teeth while on this medicine. Make sure you see your dentist for regular follow-up appointments. What side effects may I notice from receiving this medicine? Side effects that you should report   to your doctor or health care professional as soon as possible: -allergic reactions like skin rash, itching or hives, swelling of the face, lips, or tongue -anxiety, confusion, or depression -breathing problems -changes in vision -eye pain -feeling faint or lightheaded,  falls -jaw pain, especially after dental work -mouth sores -muscle cramps, stiffness, or weakness -redness, blistering, peeling or loosening of the skin, including inside the mouth -trouble passing urine or change in the amount of urine Side effects that usually do not require medical attention (report to your doctor or health care professional if they continue or are bothersome): -bone, joint, or muscle pain -constipation -diarrhea -fever -hair loss -irritation at site where injected -loss of appetite -nausea, vomiting -stomach upset -trouble sleeping -trouble swallowing -weak or tired This list may not describe all possible side effects. Call your doctor for medical advice about side effects. You may report side effects to FDA at 1-800-FDA-1088. Where should I keep my medicine? This drug is given in a hospital or clinic and will not be stored at home. NOTE: This sheet is a summary. It may not cover all possible information. If you have questions about this medicine, talk to your doctor, pharmacist, or health care provider.    2016, Elsevier/Gold Standard. (2013-07-18 14:19:39)   Neutropenia Neutropenia is a condition that occurs when the level of a certain type of white blood cell (neutrophil) in your body becomes lower than normal. Neutrophils are made in the bone marrow and fight infections. These cells protect against bacteria and viruses. The fewer neutrophils you have, and the longer your body remains without them, the greater your risk of getting a severe infection becomes. CAUSES  The cause of neutropenia may be hard to determine. However, it is usually due to 3 main problems:   Decreased production of neutrophils. This may be due to:  Certain medicines such as chemotherapy.  Genetic problems.  Cancer.  Radiation treatments.  Vitamin deficiency.  Some pesticides.  Increased destruction of neutrophils. This may be due to:  Overwhelming infections.  Hemolytic  anemia. This is when the body destroys its own blood cells.  Chemotherapy.  Neutrophils moving to areas of the body where they cannot fight infections. This may be due to:  Dialysis procedures.  Conditions where the spleen becomes enlarged. Neutrophils are held in the spleen and are not available to the rest of the body.  Overwhelming infections. The neutrophils are held in the area of the infection and are not available to the rest of the body. SYMPTOMS  There are no specific symptoms of neutropenia. The lack of neutrophils can result in an infection, and an infection can cause various problems. DIAGNOSIS  Diagnosis is made by a blood test. A complete blood count is performed. The normal level of neutrophils in human blood differs with age and race. Infants have lower counts than older children and adults. African Americans have lower counts than Caucasians or Asians. The average adult level is 1500 cells/mm3 of blood. Neutrophil counts are interpreted as follows:  Greater than 1000 cells/mm3 gives normal protection against infection.  500 to 1000 cells/mm3 gives an increased risk for infection.  200 to 500 cells/mm3 is a greater risk for severe infection.  Lower than 200 cells/mm3 is a marked risk of infection. This may require hospitalization and treatment with antibiotic medicines. TREATMENT  Treatment depends on the underlying cause, severity, and presence of infections or symptoms. It also depends on your health. Your caregiver will discuss the treatment plan with you.   Mild cases are often easily treated and have a good outcome. Preventative measures may also be started to limit your risk of infections. Treatment can include:  Taking antibiotics.  Stopping medicines that are known to cause neutropenia.  Correcting nutritional deficiencies by eating green vegetables to supply folic acid and taking vitamin B supplements.  Stopping exposure to pesticides if your neutropenia is  related to pesticide exposure.  Taking a blood growth factor called sargramostim, pegfilgrastim, or filgrastim if you are undergoing chemotherapy for cancer. This stimulates white blood cell production.  Removal of the spleen if you have Felty's syndrome and have repeated infections. HOME CARE INSTRUCTIONS   Follow your caregiver's instructions about when you need to have blood work done.  Wash your hands often. Make sure others who come in contact with you also wash their hands.  Wash raw fruits and vegetables before eating them. They can carry bacteria and fungi.  Avoid people with colds or spreadable (contagious) diseases (chickenpox, herpes zoster, influenza).  Avoid large crowds.  Avoid construction areas. The dust can release fungus into the air.  Be cautious around children in daycare or school environments.  Take care of your respiratory system by coughing and deep breathing.  Bathe daily.  Protect your skin from cuts and burns.  Do not work in the garden or with flowers and plants.  Care for the mouth before and after meals by brushing with a soft toothbrush. If you have mucositis, do not use mouthwash. Mouthwash contains alcohol and can dry out the mouth even more.  Clean the area between the genitals and the anus (perineal area) after urination and bowel movements. Women need to wipe from front to back.  Use a water soluble lubricant during sexual intercourse and practice good hygiene after. Do not have intercourse if you are severely neutropenic. Check with your caregiver for guidelines.  Exercise daily as tolerated.  Avoid people who were vaccinated with a live vaccine in the past 30 days. You should not receive live vaccines (polio, typhoid).  Do not provide direct care for pets. Avoid animal droppings. Do not clean litter boxes and bird cages.  Do not share food utensils.  Do not use tampons, enemas, or rectal suppositories unless directed by your  caregiver.  Use an electric razor to remove hair.  Wash your hands after handling magazines, letters, and newspapers. SEEK IMMEDIATE MEDICAL CARE IF:   You have a fever.  You have chills or start to shake.  You feel nauseous or vomit.  You develop mouth sores.  You develop aches and pains.  You have redness and swelling around open wounds.  Your skin is warm to the touch.  You have pus coming from your wounds.  You develop swollen lymph nodes.  You feel weak or fatigued.  You develop red streaks on the skin. MAKE SURE YOU:  Understand these instructions.  Will watch your condition.  Will get help right away if you are not doing well or get worse.   This information is not intended to replace advice given to you by your health care provider. Make sure you discuss any questions you have with your health care provider.   Document Released: 08/11/2001 Document Revised: 05/14/2011 Document Reviewed: 09/01/2014 Elsevier Interactive Patient Education 2016 Elsevier Inc.  

## 2016-03-29 LAB — IGG, IGA, IGM
IgA, Qn, Serum: 728 mg/dL — ABNORMAL HIGH (ref 87–352)
IgG, Qn, Serum: 1621 mg/dL — ABNORMAL HIGH (ref 700–1600)
IgM, Qn, Serum: 52 mg/dL (ref 26–217)

## 2016-03-29 LAB — KAPPA/LAMBDA LIGHT CHAINS
IG KAPPA FREE LIGHT CHAIN: 93.5 mg/L — AB (ref 3.3–19.4)
IG LAMBDA FREE LIGHT CHAIN: 66.4 mg/L — AB (ref 5.7–26.3)
KAPPA/LAMBDA FLC RATIO: 1.41 (ref 0.26–1.65)

## 2016-04-05 ENCOUNTER — Telehealth: Payer: Self-pay | Admitting: Pharmacist

## 2016-04-05 NOTE — Telephone Encounter (Signed)
Oral Chemotherapy Pharmacist Encounter  Received notification from [employer] that patient has been successfully enrolled in their Commercial Co-pay program for [employer] to provide assistance to reduce copay to $25 per Revlimid prescription up to a maximum benefit of $10,000 for dates 03/26/16-03/04/17.  Patient is getting Revlimid through [facility] and they likely signed patient up for this benefit.  I will place a copy of the award letter to be scanned into patient's chart.  No further needs from Oral Oncology Clinic at this time.  Johny Drilling, PharmD, BCPS, BCOP 04/05/2016  11:38 AM Oral Oncology Clinic [phone]

## 2016-04-26 ENCOUNTER — Other Ambulatory Visit: Payer: Self-pay | Admitting: *Deleted

## 2016-04-26 DIAGNOSIS — C9000 Multiple myeloma not having achieved remission: Secondary | ICD-10-CM

## 2016-04-26 MED ORDER — LENALIDOMIDE 5 MG PO CAPS: 5.0000 mg | ORAL_CAPSULE | Freq: Every day | ORAL | 0 refills | Status: DC

## 2016-05-18 ENCOUNTER — Other Ambulatory Visit: Payer: Self-pay | Admitting: *Deleted

## 2016-05-18 DIAGNOSIS — C9000 Multiple myeloma not having achieved remission: Secondary | ICD-10-CM

## 2016-05-18 MED ORDER — LENALIDOMIDE 5 MG PO CAPS: 5.0000 mg | ORAL_CAPSULE | Freq: Every day | ORAL | 0 refills | Status: DC

## 2016-06-19 ENCOUNTER — Other Ambulatory Visit: Payer: Self-pay | Admitting: *Deleted

## 2016-06-19 DIAGNOSIS — C9000 Multiple myeloma not having achieved remission: Secondary | ICD-10-CM

## 2016-06-19 MED ORDER — LENALIDOMIDE 5 MG PO CAPS: 5.0000 mg | ORAL_CAPSULE | Freq: Every day | ORAL | 0 refills | Status: DC

## 2016-06-21 ENCOUNTER — Ambulatory Visit (HOSPITAL_COMMUNITY)
Admission: RE | Admit: 2016-06-21 | Discharge: 2016-06-21 | Disposition: A | Discharge status: Home / Self Care | Payer: BC Managed Care – PPO | Source: Ambulatory Visit | Attending: Nurse Practitioner | Admitting: Nurse Practitioner

## 2016-06-21 DIAGNOSIS — C9000 Multiple myeloma not having achieved remission: Secondary | ICD-10-CM | POA: Insufficient documentation

## 2016-06-21 DIAGNOSIS — M8588 Other specified disorders of bone density and structure, other site: Secondary | ICD-10-CM | POA: Diagnosis not present

## 2016-06-28 ENCOUNTER — Ambulatory Visit (HOSPITAL_BASED_OUTPATIENT_CLINIC_OR_DEPARTMENT_OTHER): Payer: BC Managed Care – PPO | Admitting: Oncology

## 2016-06-28 ENCOUNTER — Telehealth: Payer: Self-pay | Admitting: Oncology

## 2016-06-28 ENCOUNTER — Other Ambulatory Visit (HOSPITAL_BASED_OUTPATIENT_CLINIC_OR_DEPARTMENT_OTHER): Payer: BC Managed Care – PPO

## 2016-06-28 ENCOUNTER — Ambulatory Visit (HOSPITAL_BASED_OUTPATIENT_CLINIC_OR_DEPARTMENT_OTHER): Payer: BC Managed Care – PPO

## 2016-06-28 VITALS — BP 176/84 | HR 77 | Temp 98.6°F | Resp 18 | Ht 65.5 in | Wt 202.4 lb

## 2016-06-28 DIAGNOSIS — C9000 Multiple myeloma not having achieved remission: Secondary | ICD-10-CM

## 2016-06-28 DIAGNOSIS — R000 Tachycardia, unspecified: Secondary | ICD-10-CM | POA: Diagnosis not present

## 2016-06-28 DIAGNOSIS — I10 Essential (primary) hypertension: Secondary | ICD-10-CM

## 2016-06-28 DIAGNOSIS — D630 Anemia in neoplastic disease: Secondary | ICD-10-CM

## 2016-06-28 LAB — COMPREHENSIVE METABOLIC PANEL
ALBUMIN: 3.1 g/dL — AB (ref 3.5–5.0)
ALK PHOS: 68 U/L (ref 40–150)
ALT: 14 U/L (ref 0–55)
AST: 18 U/L (ref 5–34)
Anion Gap: 9 mEq/L (ref 3–11)
BILIRUBIN TOTAL: 0.69 mg/dL (ref 0.20–1.20)
BUN: 14.2 mg/dL (ref 7.0–26.0)
CALCIUM: 8.9 mg/dL (ref 8.4–10.4)
CO2: 25 mEq/L (ref 22–29)
CREATININE: 1.1 mg/dL (ref 0.6–1.1)
Chloride: 108 mEq/L (ref 98–109)
EGFR: 64 mL/min/{1.73_m2} — ABNORMAL LOW (ref >90)
Glucose: 91 mg/dl (ref 70–140)
Potassium: 3.6 mEq/L (ref 3.5–5.1)
Sodium: 142 mEq/L (ref 136–145)
TOTAL PROTEIN: 7.6 g/dL (ref 6.4–8.3)

## 2016-06-28 LAB — CBC WITH DIFFERENTIAL/PLATELET
BASO%: 0.9 % (ref 0.0–2.0)
Basophils Absolute: 0 10*3/uL (ref 0.0–0.1)
EOS%: 5.4 % (ref 0.0–7.0)
Eosinophils Absolute: 0.1 10*3/uL (ref 0.0–0.5)
HEMATOCRIT: 36.3 % (ref 34.8–46.6)
HEMOGLOBIN: 11.7 g/dL (ref 11.6–15.9)
LYMPH#: 1.2 10*3/uL (ref 0.9–3.3)
LYMPH%: 52.5 % — ABNORMAL HIGH (ref 14.0–49.7)
MCH: 28.5 pg (ref 25.1–34.0)
MCHC: 32.2 g/dL (ref 31.5–36.0)
MCV: 88.3 fL (ref 79.5–101.0)
MONO#: 0.3 10*3/uL (ref 0.1–0.9)
MONO%: 14 % (ref 0.0–14.0)
NEUT#: 0.6 10*3/uL — ABNORMAL LOW (ref 1.5–6.5)
NEUT%: 27.2 % — ABNORMAL LOW (ref 38.4–76.8)
Platelets: 155 10*3/uL (ref 145–400)
RBC: 4.11 10*6/uL (ref 3.70–5.45)
RDW: 17.4 % — AB (ref 11.2–14.5)
WBC: 2.2 10*3/uL — ABNORMAL LOW (ref 3.9–10.3)

## 2016-06-28 MED ORDER — ZOLEDRONIC ACID 4 MG/100ML IV SOLN
4.0000 mg | Freq: Once | INTRAVENOUS | Status: AC
  Administered 2016-06-28: 4 mg via INTRAVENOUS
  Filled 2016-06-28: qty 100

## 2016-06-28 NOTE — Progress Notes (Signed)
  [place] OFFICE PROGRESS NOTE   Diagnosis:  Multiple myeloma  INTERVAL HISTORY:   Ms. Goga returns as scheduled. She continues Revlimid. No pain or recent infection. She is under significant stress while caring for her mother and with the death of her dog. She is scheduled to see Dr. Ok Edwards in June.  Objective:  Vital signs in last 24 hours:  Blood pressure (!) 176/84, pulse 77, temperature 98.6 F (37 C), temperature source Oral, resp. rate 18, height 5' 5.5" (1.664 m), weight 202 lb 6.4 oz (91.8 kg), SpO2 100 %.    HEENT: No thrush Resp: Lungs clear bilaterally Cardio: Regular rate and rhythm GI: No hepatosplenomegaly Vascular: The left lower leg is slightly larger than the right side, no edema   Lab Results:  Lab Results  Component Value Date   WBC 2.2 (L) 06/28/2016   HGB 11.7 06/28/2016   HCT 36.3 06/28/2016   MCV 88.3 06/28/2016   PLT 155 06/28/2016   NEUTROABS 0.6 (L) 06/28/2016   03/28/2016-IgG 1621, IgA 09/21/2016, kappa free light chains 93.5, lambda free light chain 66.4  X-rays: Bone survey 06/21/2016-no new lytic lesions, stable lytic lesions at the frontal skull and proximal left humerus  Medications: I have reviewed the patient's current medications.  1. Multiple myeloma, IgG kappa, status post 7 cycles of Revlimid/Decadron and Velcade with marked clinical and laboratory improvement. She is status post melphalan conditioning, followed by an autologous stem cell infusion 09/10/2008. She began maintenance Revlimid 01/04/2009. She has persistent mild elevation of the kappa and lambda light chains 2. Neutropenia while on Revlimid at a dose of 10 mg daily. The Revlimid dose was reduced to 5 mg in September 2011 due to persistent neutropenia and a hospital admission with pneumonia/sepsis. neutropenia persists. 3. Admission 11/03/2009 with left lung pneumonia and sepsis syndrome. No specific organism was cultured during the hospital admission.  She completed outpatient Levaquin therapy.  4. 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  5. Thoracic spine compression fracture, status post a kyphoplasty 12/05/2007. A metastatic bone survey was unchanged on 06/21/2016. 6. Tiny pulmonary embolism on a CT 11/20/2007, now maintained off of Coumadin.  7. Chronic tachycardia on repeat office visits at the [facility].  8. History of hypertension.  Assessment/Plan:  She appears unchanged. The chronic neutropenia is most likely related to Revlimid or persistence of myeloma. There is no other evidence of progressive myeloma. We will follow-up on the IgG and serum light chains from today. She is scheduled for an appointment at [facility] in June. She will return for a lab visit and Zometa in 3 months and an office visit in 6 months. She knows to contact us for a fever or symptoms of infection.      Betsy Coder, MD  06/28/2016  9:40 AM

## 2016-06-28 NOTE — Patient Instructions (Signed)
Zoledronic Acid injection (Hypercalcemia, Oncology) (Zometa) What is this medicine? ZOLEDRONIC ACID (ZOE le dron ik AS id) lowers the amount of calcium loss from bone. It is used to treat too much calcium in your blood from cancer. It is also used to prevent complications of cancer that has spread to the bone. This medicine may be used for other purposes; ask your health care provider or pharmacist if you have questions. What should I tell my health care provider before I take this medicine? They need to know if you have any of these conditions: -aspirin-sensitive asthma -cancer, especially if you are receiving medicines used to treat cancer -dental disease or wear dentures -infection -kidney disease -receiving corticosteroids like dexamethasone or prednisone -an unusual or allergic reaction to zoledronic acid, other medicines, foods, dyes, or preservatives -pregnant or trying to get pregnant -breast-feeding How should I use this medicine? This medicine is for infusion into a vein. It is given by a health care professional in a hospital or clinic setting. Talk to your pediatrician regarding the use of this medicine in children. Special care may be needed. Overdosage: If you think you have taken too much of this medicine contact a poison control center or emergency room at once. NOTE: This medicine is only for you. Do not share this medicine with others. What if I miss a dose? It is important not to miss your dose. Call your doctor or health care professional if you are unable to keep an appointment. What may interact with this medicine? -certain antibiotics given by injection -NSAIDs, medicines for pain and inflammation, like ibuprofen or naproxen -some diuretics like bumetanide, furosemide -teriparatide -thalidomide This list may not describe all possible interactions. Give your health care provider a list of all the medicines, herbs, non-prescription drugs, or dietary supplements you  use. Also tell them if you smoke, drink alcohol, or use illegal drugs. Some items may interact with your medicine. What should I watch for while using this medicine? Visit your doctor or health care professional for regular checkups. It may be some time before you see the benefit from this medicine. Do not stop taking your medicine unless your doctor tells you to. Your doctor may order blood tests or other tests to see how you are doing. Women should inform their doctor if they wish to become pregnant or think they might be pregnant. There is a potential for serious side effects to an unborn child. Talk to your health care professional or pharmacist for more information. You should make sure that you get enough calcium and vitamin D while you are taking this medicine. Discuss the foods you eat and the vitamins you take with your health care professional. Some people who take this medicine have severe bone, joint, and/or muscle pain. This medicine may also increase your risk for jaw problems or a broken thigh bone. Tell your doctor right away if you have severe pain in your jaw, bones, joints, or muscles. Tell your doctor if you have any pain that does not go away or that gets worse. Tell your dentist and dental surgeon that you are taking this medicine. You should not have major dental surgery while on this medicine. See your dentist to have a dental exam and fix any dental problems before starting this medicine. Take good care of your teeth while on this medicine. Make sure you see your dentist for regular follow-up appointments. What side effects may I notice from receiving this medicine? Side effects that you should report   to your doctor or health care professional as soon as possible: -allergic reactions like skin rash, itching or hives, swelling of the face, lips, or tongue -anxiety, confusion, or depression -breathing problems -changes in vision -eye pain -feeling faint or lightheaded,  falls -jaw pain, especially after dental work -mouth sores -muscle cramps, stiffness, or weakness -redness, blistering, peeling or loosening of the skin, including inside the mouth -trouble passing urine or change in the amount of urine Side effects that usually do not require medical attention (report to your doctor or health care professional if they continue or are bothersome): -bone, joint, or muscle pain -constipation -diarrhea -fever -hair loss -irritation at site where injected -loss of appetite -nausea, vomiting -stomach upset -trouble sleeping -trouble swallowing -weak or tired This list may not describe all possible side effects. Call your doctor for medical advice about side effects. You may report side effects to FDA at 1-800-FDA-1088. Where should I keep my medicine? This drug is given in a hospital or clinic and will not be stored at home. NOTE: This sheet is a summary. It may not cover all possible information. If you have questions about this medicine, talk to your doctor, pharmacist, or health care provider.    2016, Elsevier/Gold Standard. (2013-07-18 14:19:39)   Neutropenia Neutropenia is a condition that occurs when the level of a certain type of white blood cell (neutrophil) in your body becomes lower than normal. Neutrophils are made in the bone marrow and fight infections. These cells protect against bacteria and viruses. The fewer neutrophils you have, and the longer your body remains without them, the greater your risk of getting a severe infection becomes. CAUSES  The cause of neutropenia may be hard to determine. However, it is usually due to 3 main problems:   Decreased production of neutrophils. This may be due to:  Certain medicines such as chemotherapy.  Genetic problems.  Cancer.  Radiation treatments.  Vitamin deficiency.  Some pesticides.  Increased destruction of neutrophils. This may be due to:  Overwhelming infections.  Hemolytic  anemia. This is when the body destroys its own blood cells.  Chemotherapy.  Neutrophils moving to areas of the body where they cannot fight infections. This may be due to:  Dialysis procedures.  Conditions where the spleen becomes enlarged. Neutrophils are held in the spleen and are not available to the rest of the body.  Overwhelming infections. The neutrophils are held in the area of the infection and are not available to the rest of the body. SYMPTOMS  There are no specific symptoms of neutropenia. The lack of neutrophils can result in an infection, and an infection can cause various problems. DIAGNOSIS  Diagnosis is made by a blood test. A complete blood count is performed. The normal level of neutrophils in human blood differs with age and race. Infants have lower counts than older children and adults. African Americans have lower counts than Caucasians or Asians. The average adult level is 1500 cells/mm3 of blood. Neutrophil counts are interpreted as follows:  Greater than 1000 cells/mm3 gives normal protection against infection.  500 to 1000 cells/mm3 gives an increased risk for infection.  200 to 500 cells/mm3 is a greater risk for severe infection.  Lower than 200 cells/mm3 is a marked risk of infection. This may require hospitalization and treatment with antibiotic medicines. TREATMENT  Treatment depends on the underlying cause, severity, and presence of infections or symptoms. It also depends on your health. Your caregiver will discuss the treatment plan with you.  Mild cases are often easily treated and have a good outcome. Preventative measures may also be started to limit your risk of infections. Treatment can include:  Taking antibiotics.  Stopping medicines that are known to cause neutropenia.  Correcting nutritional deficiencies by eating green vegetables to supply folic acid and taking vitamin B supplements.  Stopping exposure to pesticides if your neutropenia is  related to pesticide exposure.  Taking a blood growth factor called sargramostim, pegfilgrastim, or filgrastim if you are undergoing chemotherapy for cancer. This stimulates white blood cell production.  Removal of the spleen if you have Felty's syndrome and have repeated infections. HOME CARE INSTRUCTIONS   Follow your caregiver's instructions about when you need to have blood work done.  Wash your hands often. Make sure others who come in contact with you also wash their hands.  Wash raw fruits and vegetables before eating them. They can carry bacteria and fungi.  Avoid people with colds or spreadable (contagious) diseases (chickenpox, herpes zoster, influenza).  Avoid large crowds.  Avoid construction areas. The dust can release fungus into the air.  Be cautious around children in daycare or school environments.  Take care of your respiratory system by coughing and deep breathing.  Bathe daily.  Protect your skin from cuts and burns.  Do not work in the garden or with flowers and plants.  Care for the mouth before and after meals by brushing with a soft toothbrush. If you have mucositis, do not use mouthwash. Mouthwash contains alcohol and can dry out the mouth even more.  Clean the area between the genitals and the anus (perineal area) after urination and bowel movements. Women need to wipe from front to back.  Use a water soluble lubricant during sexual intercourse and practice good hygiene after. Do not have intercourse if you are severely neutropenic. Check with your caregiver for guidelines.  Exercise daily as tolerated.  Avoid people who were vaccinated with a live vaccine in the past 30 days. You should not receive live vaccines (polio, typhoid).  Do not provide direct care for pets. Avoid animal droppings. Do not clean litter boxes and bird cages.  Do not share food utensils.  Do not use tampons, enemas, or rectal suppositories unless directed by your  caregiver.  Use an electric razor to remove hair.  Wash your hands after handling magazines, letters, and newspapers. SEEK IMMEDIATE MEDICAL CARE IF:   You have a fever.  You have chills or start to shake.  You feel nauseous or vomit.  You develop mouth sores.  You develop aches and pains.  You have redness and swelling around open wounds.  Your skin is warm to the touch.  You have pus coming from your wounds.  You develop swollen lymph nodes.  You feel weak or fatigued.  You develop red streaks on the skin. MAKE SURE YOU:  Understand these instructions.  Will watch your condition.  Will get help right away if you are not doing well or get worse.   This information is not intended to replace advice given to you by your health care provider. Make sure you discuss any questions you have with your health care provider.   Document Released: 08/11/2001 Document Revised: 05/14/2011 Document Reviewed: 09/01/2014 Elsevier Interactive Patient Education [employer].

## 2016-06-28 NOTE — Telephone Encounter (Signed)
Appointments scheduled per 4.26.18 LOS. Patient will pick up print outs in infusion.

## 2016-06-29 LAB — KAPPA/LAMBDA LIGHT CHAINS
IG KAPPA FREE LIGHT CHAIN: 98 mg/L — AB (ref 3.3–19.4)
IG LAMBDA FREE LIGHT CHAIN: 76.5 mg/L — AB (ref 5.7–26.3)
KAPPA/LAMBDA FLC RATIO: 1.28 (ref 0.26–1.65)

## 2016-06-29 LAB — IGG, IGA, IGM
IGA/IMMUNOGLOBULIN A, SERUM: 677 mg/dL — AB (ref 87–352)
IGM (IMMUNOGLOBIN M), SRM: 40 mg/dL (ref 26–217)
IgG, Qn, Serum: 1660 mg/dL — ABNORMAL HIGH (ref 700–1600)

## 2016-07-16 ENCOUNTER — Other Ambulatory Visit: Payer: Self-pay | Admitting: *Deleted

## 2016-07-16 DIAGNOSIS — C9000 Multiple myeloma not having achieved remission: Secondary | ICD-10-CM

## 2016-07-16 MED ORDER — LENALIDOMIDE 5 MG PO CAPS: 5.0000 mg | ORAL_CAPSULE | Freq: Every day | ORAL | 0 refills | Status: DC

## 2016-08-03 ENCOUNTER — Inpatient Hospital Stay (HOSPITAL_COMMUNITY)
Admission: EM | Admit: 2016-08-03 | Discharge: 2016-08-09 | DRG: 840 | Disposition: A | Discharge status: Home / Self Care | Payer: BC Managed Care – PPO | Attending: Internal Medicine | Admitting: Internal Medicine

## 2016-08-03 ENCOUNTER — Emergency Department (HOSPITAL_COMMUNITY): Payer: BC Managed Care – PPO

## 2016-08-03 ENCOUNTER — Encounter (HOSPITAL_COMMUNITY): Payer: Self-pay | Admitting: Emergency Medicine

## 2016-08-03 DIAGNOSIS — G9340 Encephalopathy, unspecified: Secondary | ICD-10-CM | POA: Diagnosis present

## 2016-08-03 DIAGNOSIS — N182 Chronic kidney disease, stage 2 (mild): Secondary | ICD-10-CM | POA: Diagnosis present

## 2016-08-03 DIAGNOSIS — D469 Myelodysplastic syndrome, unspecified: Secondary | ICD-10-CM | POA: Diagnosis present

## 2016-08-03 DIAGNOSIS — Z7982 Long term (current) use of aspirin: Secondary | ICD-10-CM | POA: Diagnosis not present

## 2016-08-03 DIAGNOSIS — E860 Dehydration: Secondary | ICD-10-CM | POA: Diagnosis present

## 2016-08-03 DIAGNOSIS — I161 Hypertensive emergency: Secondary | ICD-10-CM | POA: Diagnosis present

## 2016-08-03 DIAGNOSIS — I340 Nonrheumatic mitral (valve) insufficiency: Secondary | ICD-10-CM | POA: Diagnosis not present

## 2016-08-03 DIAGNOSIS — R531 Weakness: Secondary | ICD-10-CM | POA: Diagnosis present

## 2016-08-03 DIAGNOSIS — Z79899 Other long term (current) drug therapy: Secondary | ICD-10-CM | POA: Diagnosis not present

## 2016-08-03 DIAGNOSIS — D701 Agranulocytosis secondary to cancer chemotherapy: Secondary | ICD-10-CM | POA: Diagnosis not present

## 2016-08-03 DIAGNOSIS — D594 Other nonautoimmune hemolytic anemias: Secondary | ICD-10-CM | POA: Diagnosis not present

## 2016-08-03 DIAGNOSIS — I129 Hypertensive chronic kidney disease with stage 1 through stage 4 chronic kidney disease, or unspecified chronic kidney disease: Secondary | ICD-10-CM | POA: Diagnosis present

## 2016-08-03 DIAGNOSIS — D61818 Other pancytopenia: Secondary | ICD-10-CM | POA: Diagnosis present

## 2016-08-03 DIAGNOSIS — I248 Other forms of acute ischemic heart disease: Secondary | ICD-10-CM | POA: Diagnosis present

## 2016-08-03 DIAGNOSIS — D649 Anemia, unspecified: Secondary | ICD-10-CM

## 2016-08-03 DIAGNOSIS — N179 Acute kidney failure, unspecified: Secondary | ICD-10-CM | POA: Diagnosis present

## 2016-08-03 DIAGNOSIS — E876 Hypokalemia: Secondary | ICD-10-CM | POA: Diagnosis present

## 2016-08-03 DIAGNOSIS — I2699 Other pulmonary embolism without acute cor pulmonale: Secondary | ICD-10-CM | POA: Diagnosis present

## 2016-08-03 DIAGNOSIS — Z9481 Bone marrow transplant status: Secondary | ICD-10-CM

## 2016-08-03 DIAGNOSIS — M3119 Other thrombotic microangiopathy: Secondary | ICD-10-CM

## 2016-08-03 DIAGNOSIS — Z86711 Personal history of pulmonary embolism: Secondary | ICD-10-CM

## 2016-08-03 DIAGNOSIS — M311 Thrombotic microangiopathy: Secondary | ICD-10-CM

## 2016-08-03 DIAGNOSIS — C9000 Multiple myeloma not having achieved remission: Secondary | ICD-10-CM | POA: Diagnosis present

## 2016-08-03 DIAGNOSIS — N170 Acute kidney failure with tubular necrosis: Secondary | ICD-10-CM | POA: Diagnosis not present

## 2016-08-03 DIAGNOSIS — R748 Abnormal levels of other serum enzymes: Secondary | ICD-10-CM | POA: Diagnosis not present

## 2016-08-03 DIAGNOSIS — R4182 Altered mental status, unspecified: Secondary | ICD-10-CM | POA: Diagnosis not present

## 2016-08-03 DIAGNOSIS — E538 Deficiency of other specified B group vitamins: Secondary | ICD-10-CM

## 2016-08-03 DIAGNOSIS — D696 Thrombocytopenia, unspecified: Secondary | ICD-10-CM | POA: Diagnosis not present

## 2016-08-03 DIAGNOSIS — R809 Proteinuria, unspecified: Secondary | ICD-10-CM

## 2016-08-03 DIAGNOSIS — R7989 Other specified abnormal findings of blood chemistry: Secondary | ICD-10-CM

## 2016-08-03 DIAGNOSIS — R778 Other specified abnormalities of plasma proteins: Secondary | ICD-10-CM | POA: Diagnosis present

## 2016-08-03 LAB — CBC WITH DIFFERENTIAL/PLATELET
BASOS PCT: 1 %
BASOS PCT: 0 %
Basophils Absolute: 0 10*3/uL (ref 0.0–0.1)
Basophils Absolute: 0 10*3/uL (ref 0.0–0.1)
Eosinophils Absolute: 0 10*3/uL (ref 0.0–0.7)
Eosinophils Absolute: 0 10*3/uL (ref 0.0–0.7)
Eosinophils Relative: 1 %
Eosinophils Relative: 1 %
HCT: 24.5 % — ABNORMAL LOW (ref 36.0–46.0)
HEMATOCRIT: 27.9 % — AB (ref 36.0–46.0)
HEMOGLOBIN: 9.4 g/dL — AB (ref 12.0–15.0)
Hemoglobin: 8 g/dL — ABNORMAL LOW (ref 12.0–15.0)
LYMPHS ABS: 1.6 10*3/uL (ref 0.7–4.0)
LYMPHS ABS: 1.6 10*3/uL (ref 0.7–4.0)
Lymphocytes Relative: 40 %
Lymphocytes Relative: 40 %
MCH: 28.7 pg (ref 26.0–34.0)
MCH: 29.6 pg (ref 26.0–34.0)
MCHC: 32.7 g/dL (ref 30.0–36.0)
MCHC: 33.7 g/dL (ref 30.0–36.0)
MCV: 87.8 fL (ref 78.0–100.0)
MCV: 87.7 fL (ref 78.0–100.0)
MONO ABS: 0.6 10*3/uL (ref 0.1–1.0)
Monocytes Absolute: 0.6 10*3/uL (ref 0.1–1.0)
Monocytes Relative: 15 %
Monocytes Relative: 15 %
NEUTROS ABS: 1.7 10*3/uL (ref 1.7–7.7)
NEUTROS PCT: 44 %
NRBC: 4 /100{WBCs} — AB
Neutro Abs: 1.8 10*3/uL (ref 1.7–7.7)
Neutrophils Relative %: 43 %
PLATELETS: 7 10*3/uL — AB (ref 150–400)
Platelets: 8 10*3/uL — CL (ref 150–400)
RBC: 2.79 MIL/uL — ABNORMAL LOW (ref 3.87–5.11)
RBC: 3.18 MIL/uL — ABNORMAL LOW (ref 3.87–5.11)
RDW: 21.3 % — AB (ref 11.5–15.5)
RDW: 21.7 % — AB (ref 11.5–15.5)
WBC: 4 10*3/uL (ref 4.0–10.5)
WBC: 3.9 10*3/uL — ABNORMAL LOW (ref 4.0–10.5)

## 2016-08-03 LAB — COMPREHENSIVE METABOLIC PANEL
ALT: 14 U/L (ref 14–54)
AST: 26 U/L (ref 15–41)
Albumin: 3.3 g/dL — ABNORMAL LOW (ref 3.5–5.0)
Alkaline Phosphatase: 67 U/L (ref 38–126)
Anion gap: 10 (ref 5–15)
BILIRUBIN TOTAL: 3.2 mg/dL — AB (ref 0.3–1.2)
BUN: 18 mg/dL (ref 6–20)
CALCIUM: 8.4 mg/dL — AB (ref 8.9–10.3)
CHLORIDE: 106 mmol/L (ref 101–111)
CO2: 23 mmol/L (ref 22–32)
CREATININE: 1.33 mg/dL — AB (ref 0.44–1.00)
GFR, EST AFRICAN AMERICAN: 48 mL/min — AB (ref >60)
GFR, EST NON AFRICAN AMERICAN: 41 mL/min — AB (ref >60)
Glucose, Bld: 107 mg/dL — ABNORMAL HIGH (ref 65–99)
Potassium: 3 mmol/L — ABNORMAL LOW (ref 3.5–5.1)
Sodium: 139 mmol/L (ref 135–145)
TOTAL PROTEIN: 8 g/dL (ref 6.5–8.1)

## 2016-08-03 LAB — CBG MONITORING, ED: GLUCOSE-CAPILLARY: 102 mg/dL — AB (ref 65–99)

## 2016-08-03 LAB — URINALYSIS, ROUTINE W REFLEX MICROSCOPIC
BILIRUBIN URINE: NEGATIVE
Glucose, UA: NEGATIVE mg/dL
Ketones, ur: NEGATIVE mg/dL
Nitrite: NEGATIVE
Specific Gravity, Urine: 1.022 (ref 1.005–1.030)
pH: 5 (ref 5.0–8.0)

## 2016-08-03 LAB — PROTIME-INR
INR: 1.1
Prothrombin Time: 14.3 seconds (ref 11.4–15.2)

## 2016-08-03 LAB — TYPE AND SCREEN
ABO/RH(D): A POS
ANTIBODY SCREEN: NEGATIVE

## 2016-08-03 LAB — LACTIC ACID, PLASMA: LACTIC ACID, VENOUS: 1.4 mmol/L (ref 0.5–1.9)

## 2016-08-03 LAB — LACTATE DEHYDROGENASE: LDH: 763 U/L — ABNORMAL HIGH (ref 98–192)

## 2016-08-03 LAB — APTT: APTT: 26 s (ref 24–36)

## 2016-08-03 LAB — MAGNESIUM: Magnesium: 1.4 mg/dL — ABNORMAL LOW (ref 1.7–2.4)

## 2016-08-03 LAB — TROPONIN I
TROPONIN I: 0.6 ng/mL — AB (ref <0.03)
TROPONIN I: 0.86 ng/mL — AB (ref <0.03)

## 2016-08-03 MED ORDER — AMLODIPINE BESYLATE 10 MG PO TABS
10.0000 mg | ORAL_TABLET | Freq: Every day | ORAL | Status: DC
  Administered 2016-08-03 – 2016-08-09 (×7): 10 mg via ORAL
  Filled 2016-08-03 (×3): qty 1
  Filled 2016-08-03: qty 2
  Filled 2016-08-03 (×3): qty 1

## 2016-08-03 MED ORDER — ALPRAZOLAM 0.25 MG PO TABS: 0.2500 mg | ORAL_TABLET | Freq: Two times a day (BID) | ORAL | Status: DC | PRN

## 2016-08-03 MED ORDER — HYDRALAZINE HCL 20 MG/ML IJ SOLN
5.0000 mg | INTRAMUSCULAR | Status: DC | PRN
  Filled 2016-08-03: qty 0.25

## 2016-08-03 MED ORDER — SODIUM CHLORIDE 0.9 % IV SOLN
INTRAVENOUS | Status: DC
  Administered 2016-08-03: 125 mL/h via INTRAVENOUS

## 2016-08-03 MED ORDER — HYDRALAZINE HCL 20 MG/ML IJ SOLN
10.0000 mg | Freq: Once | INTRAMUSCULAR | Status: DC
  Filled 2016-08-03: qty 0.5

## 2016-08-03 MED ORDER — MAGNESIUM SULFATE 2 GM/50ML IV SOLN
2.0000 g | Freq: Once | INTRAVENOUS | Status: AC
  Administered 2016-08-03: 2 g via INTRAVENOUS
  Filled 2016-08-03: qty 50

## 2016-08-03 MED ORDER — ONDANSETRON HCL 4 MG/2ML IJ SOLN: 4.0000 mg | Freq: Four times a day (QID) | INTRAMUSCULAR | Status: DC | PRN

## 2016-08-03 MED ORDER — NITROGLYCERIN 0.4 MG SL SUBL: 0.4000 mg | SUBLINGUAL_TABLET | SUBLINGUAL | Status: DC | PRN

## 2016-08-03 MED ORDER — CARVEDILOL 3.125 MG PO TABS
3.1250 mg | ORAL_TABLET | Freq: Two times a day (BID) | ORAL | Status: DC
  Administered 2016-08-03: 3.125 mg via ORAL
  Filled 2016-08-03 (×2): qty 1

## 2016-08-03 MED ORDER — SODIUM CHLORIDE 0.9 % IV SOLN: Freq: Once | INTRAVENOUS | Status: DC

## 2016-08-03 MED ORDER — ACETAMINOPHEN 325 MG PO TABS
650.0000 mg | ORAL_TABLET | ORAL | Status: DC | PRN
  Administered 2016-08-04: 650 mg via ORAL
  Filled 2016-08-03: qty 2

## 2016-08-03 MED ORDER — ZOLPIDEM TARTRATE 5 MG PO TABS: 5.0000 mg | ORAL_TABLET | Freq: Every evening | ORAL | Status: DC | PRN

## 2016-08-03 MED ORDER — ATORVASTATIN CALCIUM 40 MG PO TABS
40.0000 mg | ORAL_TABLET | Freq: Every day | ORAL | Status: DC
  Administered 2016-08-03 – 2016-08-08 (×6): 40 mg via ORAL
  Filled 2016-08-03 (×6): qty 1

## 2016-08-03 MED ORDER — MAGNESIUM OXIDE 400 (241.3 MG) MG PO TABS
400.0000 mg | ORAL_TABLET | Freq: Once | ORAL | Status: DC
  Administered 2016-08-03: 400 mg via ORAL
  Filled 2016-08-03: qty 1

## 2016-08-03 MED ORDER — LENALIDOMIDE 5 MG PO CAPS: 5.0000 mg | ORAL_CAPSULE | Freq: Every day | ORAL | Status: DC

## 2016-08-03 MED ORDER — POTASSIUM CHLORIDE CRYS ER 20 MEQ PO TBCR
40.0000 meq | EXTENDED_RELEASE_TABLET | Freq: Once | ORAL | Status: AC
  Administered 2016-08-03: 40 meq via ORAL
  Filled 2016-08-03: qty 2

## 2016-08-03 MED ORDER — MORPHINE SULFATE (PF) 2 MG/ML IV SOLN: 2.0000 mg | INTRAVENOUS | Status: DC | PRN

## 2016-08-03 NOTE — H&P (Signed)
History and Physical    Carrie Huber CLE:[id number] DOB: Mar 06, 1950 DOA: 08/03/2016  Referring MD/NP/PA:   PCP: Viann Fish, RN   Patient coming from:  The patient is coming from home.  At baseline, pt is independent for most of ADL.   Chief Complaint: Generalized weakness   HPI: Carrie Huber is a 66 y.o. female with medical history significant of hypertension, multiple myeloma (s/p of autologous bone marrow transplantation 2010), PE 7 years ago, anemia, chronic kidney disease-stage II, who presents with generalized weakness.  Patient states that she has been having generalized weakness in the past 4 days, which has been progressively getting worse. She does not have unilateral weakness, numbness or tingling in extremities. No facial droop or slurred speech. Denies symptoms of UTI. No nausea, vomiting, diarrhea or abdominal pain. Patient does not have chest pain or shortness of breath. No cough, fever or chills. Patient blood pressure elevated at SBP 200s initally, then 180/89.   ED Course: pt was found to have positive troponin 0.60, lactic acid 1.4, urinalysis with trace amount of leukocyte, hemoglobin dropped from 11.7 on 06/28/16 to 8.0 (hgb 9.4 on the repeated CBC), platelets dropped from 155 on 06/28/16-7.0 (8.0 on the repeated CBC), potassium is 3.0, worsening renal function, temperature normal, tachycardia, oxygen saturation 96% on room air, negative chest x-ray, negative CT head for acute intracranial abnormalities. Patient is admitted to telemetry bed as inpatient.  Review of Systems:   General: no fevers, chills, no changes in body weight, has poor appetite, has fatigue HEENT: no blurry vision, hearing changes or sore throat Respiratory: no dyspnea, coughing, wheezing CV: no chest pain, no palpitations GI: no nausea, vomiting, abdominal pain, diarrhea, constipation GU: no dysuria, burning on urination, increased urinary frequency, hematuria  Ext: no leg edema Neuro: no  unilateral weakness, numbness, or tingling, no vision change or hearing loss Skin: no rash, no skin tear. MSK: No muscle spasm, no deformity, no limitation of range of movement in spin Heme: No easy bruising.  Travel history: No recent long distant travel.  Allergy:  Allergies  Allergen Reactions  . Relafen [Nabumetone]     Past Medical History:  Diagnosis Date  . Anemia   . Blood transfusion    IRRADIATE ALL PRODUCTS  . Eczema    Left foot  . Multiple myeloma 2009  . Multiple Myeloma 2009  . Pulmonary embolism ([place]) 11/20/2007  . 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 Tachycardia, unspecified    Chronic  . Thoracic compression fracture ([place]) 10/2007   Multiple    Past Surgical History:  Procedure Laterality Date  . FIXATION KYPHOPLASTY THORACIC SPINE  12/05/2007    Social History:  reports that she has never smoked. She has never used smokeless tobacco. She reports that she does not drink alcohol or use drugs.  Family History: History reviewed. No pertinent family history.   Prior to Admission medications   Medication Sig Start Date End Date Taking? Authorizing Provider  aspirin 81 MG tablet Take 81 mg by mouth daily.     Yes Ladell Pier, MD  lenalidomide (REVLIMID) 5 MG capsule Take 1 capsule (5 mg total) by mouth daily. 07/16/16  Yes Ladell Pier, MD    Physical Exam: Vitals:   08/03/16 2138 08/03/16 2139 08/03/16 2230 08/03/16 2344  BP:  (!) 156/91 (!) 152/89 (!) 148/84  Pulse:  93 86 85  Resp:  '14 16 18  ' Temp:  99.3 F (37.4 C)  TempSrc:    Oral  SpO2:  97% 100% 100%  Weight: 86.2 kg (190 lb)   87.7 kg (193 lb 6.4 oz)  Height: '5\' 7"'  (1.702 m)   '5\' 7"'  (1.702 m)   General: Not in acute distress. Pale looking. HEENT:       Eyes: PERRL, EOMI, no scleral icterus.       ENT: No discharge from the ears and nose, no pharynx injection, no tonsillar enlargement.        Neck: No JVD, no bruit, no  mass felt. Heme: No neck lymph node enlargement. Cardiac: S1/S2, RRR, No murmurs, No gallops or rubs. Respiratory: No rales, wheezing, rhonchi or rubs. GI: Soft, nondistended, nontender, no rebound pain, no organomegaly, BS present. GU: No hematuria Ext: No pitting leg edema bilaterally. 2+DP/PT pulse bilaterally. Musculoskeletal: No joint deformities, No joint redness or warmth, no limitation of ROM in spin. Skin: No rashes.  Neuro: Alert, oriented X3, cranial nerves II-XII grossly intact, moves all extremities normally. Psych: Patient is not psychotic, no suicidal or hemocidal ideation.  Labs on Admission: I have personally reviewed following labs and imaging studies  CBC:  Recent Labs Lab 08/03/16 1932 08/03/16 2138  WBC 4.0 3.9*  NEUTROABS 1.8 1.7  HGB 8.0* 9.4*  HCT 24.5* 27.9*  MCV 87.8 87.7  PLT 7* 8*   Basic Metabolic Panel:  Recent Labs Lab 08/03/16 1932  NA 139  K 3.0*  CL 106  CO2 23  GLUCOSE 107*  BUN 18  CREATININE 1.33*  CALCIUM 8.4*  MG 1.4*   GFR: Estimated Creatinine Clearance: 48.6 mL/min (A) (by C-G formula based on SCr of 1.33 mg/dL (H)). Liver Function Tests:  Recent Labs Lab 08/03/16 1932  AST 26  ALT 14  ALKPHOS 67  BILITOT 3.2*  PROT 8.0  ALBUMIN 3.3*   No results for input(s): LIPASE, AMYLASE in the last 168 hours. No results for input(s): AMMONIA in the last 168 hours. Coagulation Profile:  Recent Labs Lab 08/03/16 2138  INR 1.10   Cardiac Enzymes:  Recent Labs Lab 08/03/16 1932 08/03/16 2138  TROPONINI 0.60* 0.86*   BNP (last 3 results) No results for input(s): PROBNP in the last 8760 hours. HbA1C: No results for input(s): HGBA1C in the last 72 hours. CBG:  Recent Labs Lab 08/03/16 1852  GLUCAP 102*   Lipid Profile: No results for input(s): CHOL, HDL, LDLCALC, TRIG, CHOLHDL, LDLDIRECT in the last 72 hours. Thyroid Function Tests: No results for input(s): TSH, T4TOTAL, FREET4, T3FREE, THYROIDAB in the  last 72 hours. Anemia Panel: No results for input(s): VITAMINB12, FOLATE, FERRITIN, TIBC, IRON, RETICCTPCT in the last 72 hours. Urine analysis:    Component Value Date/Time   COLORURINE YELLOW 08/03/2016 1839   APPEARANCEUR HAZY (A) 08/03/2016 1839   LABSPEC 1.022 08/03/2016 1839   LABSPEC 1.020 11/03/2009 1152   PHURINE 5.0 08/03/2016 1839   GLUCOSEU NEGATIVE 08/03/2016 1839   HGBUR LARGE (A) 08/03/2016 1839   BILIRUBINUR NEGATIVE 08/03/2016 1839   BILIRUBINUR Negative 11/03/2009 1152   [place] 08/03/2016 1839   PROTEINUR >=300 (A) 08/03/2016 1839   UROBILINOGEN 0.2 11/05/2009 1430   NITRITE NEGATIVE 08/03/2016 1839   LEUKOCYTESUR TRACE (A) 08/03/2016 1839   LEUKOCYTESUR Trace 11/03/2009 1152   Sepsis Labs: '@LABRCNTIP' (procalcitonin:4,lacticidven:4) )No results found for this or any previous visit (from the past 240 hour(s)).   Radiological Exams on Admission: Dg Chest 2 View  Result Date: 08/03/2016 CLINICAL DATA:  Weakness and fatigue  x4 days. No dyspnea or chest pain. EXAM: CHEST  2 VIEW COMPARISON:  06/21/2016 FINDINGS: The heart size and mediastinal contours are within normal limits. Both lungs are clear. Chronic stable moderate compressions of T8 through T12 with kyphoplasties at T10 and T12. Mild superior endplate compression of L1, osteoporotic compression of L2 and L3 and superior endplate compression of the visualized L4 vertebral body appear stable. IMPRESSION: No active cardiopulmonary disease. Chronic stable thoracolumbar compression fractures. Electronically Signed   By: Ashley Royalty M.D.   On: 08/03/2016 19:32   Ct Head Wo Contrast  Result Date: 08/03/2016 CLINICAL DATA:  Generalized weakness EXAM: CT HEAD WITHOUT CONTRAST TECHNIQUE: Contiguous axial images were obtained from the base of the skull through the vertex without intravenous contrast. COMPARISON:  None. FINDINGS: Brain: No evidence of acute infarction, hemorrhage, hydrocephalus, extra-axial  collection or mass lesion/mass effect. Vascular: No hyperdense vessel or unexpected calcification. Skull: Normal. Negative for fracture or focal lesion. Sinuses/Orbits: No acute finding. Other: None. IMPRESSION: No acute intracranial abnormality is noted. Electronically Signed   By: Inez Catalina M.D.   On: 08/03/2016 20:18     EKG: Independently reviewed.  Sinus rhythm, QTC 463, low voltage, nonspecific T-wave change, old Q waves he currently   Assessment/Plan Principal Problem:   Elevated troponin Active Problems:   Multiple myeloma (HCC)   Generalized weakness   Hypokalemia   Thrombocytopenia (HCC)   Normocytic anemia   Acute renal failure superimposed on stage 2 chronic kidney disease (HCC)   Hypertensive emergency   Symptomatic anemia   Elevated troponin: Troponin 0.60, 0.86. No chest pain. Likely due to demand ischemia secondary to hypertensive emergency. Blood pressure was elevated at SBP 200, then 180/89. Cardiology, Dr. Emilio Aspen was consulted.   - will admit to tele bed as inpt. - blood pressure control as below - cycle CE q6 x3 and repeat EKG in the am  - prn Nitroglycerin, Morphine, and lipitor, coreg - will not start ASA until FOBT negative - Risk factor stratification: will check FLP, UDS and A1C  - 2d echo  Hypertensive emergency: Patient is not taking blood pressure medications at home. Blood pressure was elevated at SBP 200, then 180/89.  -start amlodipine 10 mg daily -IVF at hydralazine when necessary -Coreg 6.25 mg twice a day  Symptomatic anemia (normocytic anemia)/thrombocytopenia/multiple myeloma: Hemoglobin dropped from 11.7 on 4/26/8 to 8.0 today. Platelets dropped from 155 on 06/28/16 to 7. No bleeding tendency. Etiology is not clear. Mental status normal, less likely to have TTP. Oncology, Dr. Alvy Bimler was consulted. -will check LDH, peripheral smear, haptoglobin, FOBT -Transfuse 1 unit of blood and 2 units of platelet (both irradiated) - f/u oncology's  recommendation  Hypokalemia and hypomagnesemia anemia: K= 1.4 and Mg 1.4 on admission. - Repleted both  Acute renal failure superimposed on stage 2 chronic kidney disease ([place]):  Baseline Cre is 1.1, pt's Cre is 1.33 on admission. Likely due to combination of hypertensive emergency and prerenal secondary to dehydration. - IVF: 125 cc/h of NS - Check FeNa - Follow up renal function by BMP   DVT ppx: SCD Code Status: Full code Family Communication: Yes, patient's husband at bed side Disposition Plan:  Anticipate discharge back to previous home environment Consults called: Card, Dr. Emilio Aspen, and Oncology, dr. Alvy Bimler  Admission status: Inpatient/tele          Date of Service 08/04/2016    Ivor Costa [facility] Pager [phone]  If 7PM-7AM, please contact night-coverage [email or web address] Password [email or web address] 08/04/2016, 1:43 AM

## 2016-08-03 NOTE — ED Provider Notes (Signed)
[facility] DEPT Provider Note   CSN: [id number] Arrival date & time: 08/03/16  1808     History   Chief Complaint Chief Complaint  Patient presents with  . Weakness    HPI Carrie Huber is a 66 y.o. female.  HPI  Pt was seen at [facility]. Per pt, c/o gradual onset and persistence of constant generalized weakness/fatigue for the past 4 days. Pt states she has been "under a lot of stress" taking care of her elderly mother and a brother with mental health issues who is off his psych meds. Pt denies any specific complaints. Other than her "BP was high." Denies CP/palpitations, no SOB/cough, no abd pain, no N/V/D, no fevers, no rash, no visual changes, no focal motor weakness, no tingling/numbness in extremities, no ataxia, no slurred speech, no facial droop.    Past Medical History:  Diagnosis Date  . Anemia   . Blood transfusion    IRRADIATE ALL PRODUCTS  . Eczema    Left foot  . Multiple myeloma 2009  . Multiple Myeloma 2009  . Pulmonary embolism ([place]) 11/20/2007  . 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 Tachycardia, unspecified    Chronic  . Thoracic compression fracture [facility]) 10/2007   Multiple    Patient Active Problem List   Diagnosis Date Noted  . Myeloma ([place]) 09/18/2011  . Multiple myeloma ([place]) 03/26/2011    Past Surgical History:  Procedure Laterality Date  . FIXATION KYPHOPLASTY THORACIC SPINE  12/05/2007    OB History    No data available       Home Medications    Prior to Admission medications   Medication Sig Start Date End Date Taking? Authorizing Provider  aspirin 81 MG tablet Take 81 mg by mouth daily.     Yes Ladell Pier, MD  lenalidomide (REVLIMID) 5 MG capsule Take 1 capsule (5 mg total) by mouth daily. 07/16/16  Yes Ladell Pier, MD    Family History History reviewed. No pertinent family history.  Social History Social History  Substance Use Topics  . Smoking status:  Never Smoker  . Smokeless tobacco: Never Used  . Alcohol use No     Allergies   Relafen [nabumetone]   Review of Systems Review of Systems ROS: Statement: All systems negative except as marked or noted in the HPI; Constitutional: Negative for fever and chills. +generalized weakness/fatigue.; ; Eyes: Negative for eye pain, redness and discharge. ; ; ENMT: Negative for ear pain, hoarseness, nasal congestion, sinus pressure and sore throat. ; ; Cardiovascular: Negative for chest pain, palpitations, diaphoresis, dyspnea and peripheral edema. ; ; Respiratory: Negative for cough, wheezing and stridor. ; ; Gastrointestinal: Negative for nausea, vomiting, diarrhea, abdominal pain, blood in stool, hematemesis, jaundice and rectal bleeding. . ; ; Genitourinary: Negative for dysuria, flank pain and hematuria. ; ; Musculoskeletal: Negative for back pain and neck pain. Negative for swelling and trauma.; ; Skin: Negative for pruritus, rash, abrasions, blisters, bruising and skin lesion.; ; Neuro: Negative for headache, lightheadedness and neck stiffness. Negative for altered level of consciousness, altered mental status, extremity weakness, paresthesias, involuntary movement, seizure and syncope.       Physical Exam Updated Vital Signs BP (!) 180/89 (BP Location: Right Arm)   Pulse 96   Temp 98.9 F (37.2 C) (Oral)   Resp 16   SpO2 96%    20:08 Orthostatic Vital Signs LH  Orthostatic Lying   BP- Lying: 168/87  Pulse- Lying: 87      Orthostatic Sitting  BP- Sitting:  176/91  Pulse- Sitting: 104      Orthostatic Standing at 0 minutes  BP- Standing at 0 minutes:  166/93  Pulse- Standing at 0 minutes: 104    Physical Exam 1900: Physical examination:  Nursing notes reviewed; Vital signs and O2 SAT reviewed;  Constitutional: Well developed, Well nourished, Well hydrated, In no acute distress; Head:  Normocephalic, atraumatic; Eyes: EOMI, PERRL, No scleral icterus; ENMT: Mouth and pharynx  normal, Mucous membranes moist; Neck: Supple, Full range of motion, No lymphadenopathy; Cardiovascular: Regular rate and rhythm, No gallop; Respiratory: Breath sounds clear & equal bilaterally, No wheezes.  Speaking full sentences with ease, Normal respiratory effort/excursion; Chest: Nontender, Movement normal; Abdomen: Soft, Nontender, Nondistended, Normal bowel sounds; Genitourinary: No CVA tenderness; Extremities: Pulses normal, No tenderness, No edema, No calf edema or asymmetry.; Neuro: AA&Ox3, Major CN grossly intact. Speech clear.  No facial droop. Grips equal. Strength 5/5 equal bilat UE's and LE's.  DTR 2/4 equal bilat UE's and LE's.  No gross sensory deficits.  Normal cerebellar testing bilat UE's (finger-nose) and LE's (heel-shin)..; Skin: Color normal, Warm, Dry.   ED Treatments / Results  Labs (all labs ordered are listed, but only abnormal results are displayed)   EKG  EKG Interpretation  Date/Time:  Friday August 03 2016 18:37:11 EDT Ventricular Rate:  90 PR Interval:    QRS Duration: 92 QT Interval:  378 QTC Calculation: 463 R Axis:   16 Text Interpretation:  Sinus rhythm Low voltage, precordial leads Abnormal inferior Q waves Baseline wander Artifact When compared with ECG of 12/05/2007 Rate slower Confirmed by [facility]  MD, Nunzio Cory [phone]) on 08/03/2016 6:49:02 PM       Radiology   Procedures Procedures (including critical care time)  Medications Ordered in ED Medications - No data to display   Initial Impression / Assessment and Plan / ED Course  I have reviewed the triage vital signs and the nursing notes.  Pertinent labs & imaging results that were available during my care of the patient were reviewed by me and considered in my medical decision making (see chart for details).  MDM Reviewed: previous chart, nursing note and vitals Reviewed previous: labs and ECG Interpretation: labs, ECG, x-ray and CT scan   Results for orders placed or performed during the  hospital encounter of 08/03/16  Urinalysis, Routine w reflex microscopic  Result Value Ref Range   Color, Urine YELLOW YELLOW   APPearance HAZY (A) CLEAR   Specific Gravity, Urine 1.022 1.005 - 1.030   pH 5.0 5.0 - 8.0   Glucose, UA NEGATIVE NEGATIVE mg/dL   Hgb urine dipstick LARGE (A) NEGATIVE   Bilirubin Urine NEGATIVE NEGATIVE   Ketones, ur NEGATIVE NEGATIVE mg/dL   Protein, ur >=300 (A) NEGATIVE mg/dL   Nitrite NEGATIVE NEGATIVE   Leukocytes, UA TRACE (A) NEGATIVE   RBC / HPF 0-5 0 - 5 RBC/hpf   WBC, UA 6-30 0 - 5 WBC/hpf   Bacteria, UA RARE (A) NONE SEEN   Squamous Epithelial / LPF 0-5 (A) NONE SEEN   Mucous PRESENT    Hyaline Casts, UA PRESENT   CBC with Differential  Result Value Ref Range   WBC 4.0 4.0 - 10.5 K/uL   RBC 2.79 (L) 3.87 - 5.11 MIL/uL   Hemoglobin 8.0 (L) 12.0 - 15.0 g/dL   HCT 24.5 (L) 36.0 - 46.0 %   MCV 87.8 78.0 - 100.0 fL  MCH 28.7 26.0 - 34.0 pg   MCHC 32.7 30.0 - 36.0 g/dL   RDW 21.3 (H) 11.5 - 15.5 %   Platelets 7 (LL) 150 - 400 K/uL   Neutrophils Relative % 43 %   Lymphocytes Relative 40 %   Monocytes Relative 15 %   Eosinophils Relative 1 %   Basophils Relative 1 %   Neutro Abs 1.8 1.7 - 7.7 K/uL   Lymphs Abs 1.6 0.7 - 4.0 K/uL   Monocytes Absolute 0.6 0.1 - 1.0 K/uL   Eosinophils Absolute 0.0 0.0 - 0.7 K/uL   Basophils Absolute 0.0 0.0 - 0.1 K/uL   RBC Morphology POLYCHROMASIA PRESENT   Comprehensive metabolic panel  Result Value Ref Range   Sodium 139 135 - 145 mmol/L   Potassium 3.0 (L) 3.5 - 5.1 mmol/L   Chloride 106 101 - 111 mmol/L   CO2 23 22 - 32 mmol/L   Glucose, Bld 107 (H) 65 - 99 mg/dL   BUN 18 6 - 20 mg/dL   Creatinine, Ser 1.33 (H) 0.44 - 1.00 mg/dL   Calcium 8.4 (L) 8.9 - 10.3 mg/dL   Total Protein 8.0 6.5 - 8.1 g/dL   Albumin 3.3 (L) 3.5 - 5.0 g/dL   AST 26 15 - 41 U/L   ALT 14 14 - 54 U/L   Alkaline Phosphatase 67 38 - 126 U/L   Total Bilirubin 3.2 (H) 0.3 - 1.2 mg/dL   GFR calc non Af Amer 41 (L) >60 mL/min     GFR calc Af Amer 48 (L) >60 mL/min   Anion gap 10 5 - 15  Lactic acid, plasma  Result Value Ref Range   Lactic Acid, Venous 1.4 0.5 - 1.9 mmol/L  Troponin I  Result Value Ref Range   Troponin I 0.60 (HH) <0.03 ng/mL  CBG monitoring, ED  Result Value Ref Range   Glucose-Capillary 102 (H) 65 - 99 mg/dL   Comment 1 Notify RN    Comment 2 Document in Chart    Dg Chest 2 View Result Date: 08/03/2016 CLINICAL DATA:  Weakness and fatigue x4 days. No dyspnea or chest pain. EXAM: CHEST  2 VIEW COMPARISON:  06/21/2016 FINDINGS: The heart size and mediastinal contours are within normal limits. Both lungs are clear. Chronic stable moderate compressions of T8 through T12 with kyphoplasties at T10 and T12. Mild superior endplate compression of L1, osteoporotic compression of L2 and L3 and superior endplate compression of the visualized L4 vertebral body appear stable. IMPRESSION: No active cardiopulmonary disease. Chronic stable thoracolumbar compression fractures. Electronically Signed   By: Ashley Royalty M.D.   On: 08/03/2016 19:32   Ct Head Wo Contrast Result Date: 08/03/2016 CLINICAL DATA:  Generalized weakness EXAM: CT HEAD WITHOUT CONTRAST TECHNIQUE: Contiguous axial images were obtained from the base of the skull through the vertex without intravenous contrast. COMPARISON:  None. FINDINGS: Brain: No evidence of acute infarction, hemorrhage, hydrocephalus, extra-axial collection or mass lesion/mass effect. Vascular: No hyperdense vessel or unexpected calcification. Skull: Normal. Negative for fracture or focal lesion. Sinuses/Orbits: No acute finding. Other: None. IMPRESSION: No acute intracranial abnormality is noted. Electronically Signed   By: Inez Catalina M.D.   On: 08/03/2016 20:18     2055:  Not orthostatic on VS. Potassium repleted PO. Troponin elevated; pt denies CP now or at any time recently. T/C to Cards Fellow Dr. Emilio Aspen, case discussed, including:  HPI, pertinent PM/SHx, VS/PE, dx  testing, ED course and treatment:  Agreeable to consult, requests  to hold ASA and heparin at this time given lab values.  2115:  T/C to [facility] Dr. Blaine Hamper, case discussed, including:  HPI, pertinent PM/SHx, VS/PE, dx testing, ED course and treatment:  Agreeable to come to ED for evaluation.  2130:  T/C to Med Onc Dr. Alvy Bimler, case discussed, including:  HPI, pertinent PM/SHx, VS/PE, dx testing, ED course and treatment: states since such a large change in labs from just 1 months ago, STAT re-check CBC, if numbers result the same (ie: plts <10), pt will need irradiated platelet transfusion, Onc will consult tomorrow. CBC and hold clot ordered. T/C to [facility] Dr. Blaine Hamper:  Updated regarding Med Onc MD recommendations.         Final Clinical Impressions(s) / ED Diagnoses   Final diagnoses:  None    New Prescriptions New Prescriptions   No medications on file     Francine Graven, DO 08/06/16 2124

## 2016-08-03 NOTE — ED Triage Notes (Signed)
Pt via EMS with Gen weakness x4 days. HTN initially in the 200s now 171/87. NSR. Denies pain, A/O x4. Vitals stable.

## 2016-08-03 NOTE — ED Notes (Signed)
Called to give report. Secretary stated nurse will call back.

## 2016-08-03 NOTE — Consult Note (Signed)
CARDIOLOGY CONSULT NOTE  Reason for Consultation: Positive troponin  HPI: Carrie Huber is a 66 y.o. female w/ HTN, multiple myeloma (s/p auto HSCT 2010), PE, anemia, CKD p/w weakness.   Symptoms of weakness started about 4 days ago. Global in character. No CP or SOB. No fevers, chills. No bleeding, bruising.   Found to be hypertensive to 200's on arrival, improving to 170's. ECG with borderline low voltage but no evidence of acute ischemia. Labs revealing troponin of 0.6-->0.86, elevated LDH, increasing creatinine, hypokalemia, worsening anemia, platelet count of 7. The patient was transferred to [facility] and cardiology was consulted for a positive troponin.   Past Medical History:  Diagnosis Date  . Anemia   . Blood transfusion    IRRADIATE ALL PRODUCTS  . Eczema    Left foot  . Multiple myeloma 2009  . Multiple Myeloma 2009  . Pulmonary embolism ([place]) 11/20/2007  . 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 Tachycardia, unspecified    Chronic  . Thoracic compression fracture [facility]) 10/2007   Multiple    Medications Prior to Admission  Medication Sig Dispense Refill  . aspirin 81 MG tablet Take 81 mg by mouth daily.      [name] lenalidomide (REVLIMID) 5 MG capsule Take 1 capsule (5 mg total) by mouth daily. 28 capsule 0     . amLODipine  10 mg Oral Daily  . atorvastatin  40 mg Oral q1800  . carvedilol  3.125 mg Oral BID WC  . hydrALAZINE  10 mg Intravenous Once    Infusions: . sodium chloride 125 mL/hr (08/03/16 2244)  . sodium chloride      Allergies  Allergen Reactions  . Relafen [Nabumetone]     Social History   Social History  . Marital status: Married    Spouse name: N/A  . Number of children: N/A  . Years of education: N/A   Occupational History  . Not on file.   Social History Main Topics  . Smoking status: Never Smoker  . Smokeless tobacco: Never Used  . Alcohol use No  . Drug use: No  . Sexual  activity: Not Currently   Other Topics Concern  . Not on file   Social History Narrative  . No narrative on file    History reviewed. No pertinent family history.  PHYSICAL EXAM: Vitals:   08/03/16 2230 08/03/16 2344  BP: (!) 152/89 (!) 148/84  Pulse: 86 85  Resp: 16 18  Temp:  99.3 F (37.4 C)    No intake or output data in the 24 hours ending 08/03/16 2347  General:  NAD HEENT: normal Neck: supple. no JVD.  Cor: RRR Lungs: CTAB Abdomen: soft, NT, ND, +BS or masses Extremities: no edema  ECG: NSR, borderline low voltage, no ST/T wave abnormalities, no Q waves  Results for orders placed or performed during the hospital encounter of 08/03/16 (from the past 24 hour(s))  Urinalysis, Routine w reflex microscopic     Status: Abnormal   Collection Time: 08/03/16  6:39 PM  Result Value Ref Range   Color, Urine YELLOW YELLOW   APPearance HAZY (A) CLEAR   Specific Gravity, Urine 1.022 1.005 - 1.030   pH 5.0 5.0 - 8.0   Glucose, UA NEGATIVE NEGATIVE mg/dL   Hgb urine dipstick LARGE (A) NEGATIVE   Bilirubin Urine NEGATIVE NEGATIVE   Ketones, ur NEGATIVE NEGATIVE mg/dL   Protein, ur >=300 (A) NEGATIVE mg/dL  Nitrite NEGATIVE NEGATIVE   Leukocytes, UA TRACE (A) NEGATIVE   RBC / HPF 0-5 0 - 5 RBC/hpf   WBC, UA 6-30 0 - 5 WBC/hpf   Bacteria, UA RARE (A) NONE SEEN   Squamous Epithelial / LPF 0-5 (A) NONE SEEN   Mucous PRESENT    Hyaline Casts, UA PRESENT   CBG monitoring, ED     Status: Abnormal   Collection Time: 08/03/16  6:52 PM  Result Value Ref Range   Glucose-Capillary 102 (H) 65 - 99 mg/dL   Comment 1 Notify RN    Comment 2 Document in Chart   CBC with Differential     Status: Abnormal   Collection Time: 08/03/16  7:32 PM  Result Value Ref Range   WBC 4.0 4.0 - 10.5 K/uL   RBC 2.79 (L) 3.87 - 5.11 MIL/uL   Hemoglobin 8.0 (L) 12.0 - 15.0 g/dL   HCT 24.5 (L) 36.0 - 46.0 %   MCV 87.8 78.0 - 100.0 fL   MCH 28.7 26.0 - 34.0 pg   MCHC 32.7 30.0 - 36.0 g/dL    RDW 21.3 (H) 11.5 - 15.5 %   Platelets 7 (LL) 150 - 400 K/uL   Neutrophils Relative % 43 %   Lymphocytes Relative 40 %   Monocytes Relative 15 %   Eosinophils Relative 1 %   Basophils Relative 1 %   Neutro Abs 1.8 1.7 - 7.7 K/uL   Lymphs Abs 1.6 0.7 - 4.0 K/uL   Monocytes Absolute 0.6 0.1 - 1.0 K/uL   Eosinophils Absolute 0.0 0.0 - 0.7 K/uL   Basophils Absolute 0.0 0.0 - 0.1 K/uL   RBC Morphology POLYCHROMASIA PRESENT   Comprehensive metabolic panel     Status: Abnormal   Collection Time: 08/03/16  7:32 PM  Result Value Ref Range   Sodium 139 135 - 145 mmol/L   Potassium 3.0 (L) 3.5 - 5.1 mmol/L   Chloride 106 101 - 111 mmol/L   CO2 23 22 - 32 mmol/L   Glucose, Bld 107 (H) 65 - 99 mg/dL   BUN 18 6 - 20 mg/dL   Creatinine, Ser 1.33 (H) 0.44 - 1.00 mg/dL   Calcium 8.4 (L) 8.9 - 10.3 mg/dL   Total Protein 8.0 6.5 - 8.1 g/dL   Albumin 3.3 (L) 3.5 - 5.0 g/dL   AST 26 15 - 41 U/L   ALT 14 14 - 54 U/L   Alkaline Phosphatase 67 38 - 126 U/L   Total Bilirubin 3.2 (H) 0.3 - 1.2 mg/dL   GFR calc non Af Amer 41 (L) >60 mL/min   GFR calc Af Amer 48 (L) >60 mL/min   Anion gap 10 5 - 15  Lactic acid, plasma     Status: None   Collection Time: 08/03/16  7:32 PM  Result Value Ref Range   Lactic Acid, Venous 1.4 0.5 - 1.9 mmol/L  Troponin I     Status: Abnormal   Collection Time: 08/03/16  7:32 PM  Result Value Ref Range   Troponin I 0.60 (HH) <0.03 ng/mL  Magnesium     Status: Abnormal   Collection Time: 08/03/16  7:32 PM  Result Value Ref Range   Magnesium 1.4 (L) 1.7 - 2.4 mg/dL  CBC with Differential     Status: Abnormal   Collection Time: 08/03/16  9:38 PM  Result Value Ref Range   WBC 3.9 (L) 4.0 - 10.5 K/uL   RBC 3.18 (L) 3.87 - 5.11 MIL/uL  Hemoglobin 9.4 (L) 12.0 - 15.0 g/dL   HCT 27.9 (L) 36.0 - 46.0 %   MCV 87.7 78.0 - 100.0 fL   MCH 29.6 26.0 - 34.0 pg   MCHC 33.7 30.0 - 36.0 g/dL   RDW 21.7 (H) 11.5 - 15.5 %   Platelets 8 (LL) 150 - 400 K/uL   Neutrophils Relative  % 44 %   Lymphocytes Relative 40 %   Monocytes Relative 15 %   Eosinophils Relative 1 %   Basophils Relative 0 %   nRBC 4 (H) 0 /100 WBC   Neutro Abs 1.7 1.7 - 7.7 K/uL   Lymphs Abs 1.6 0.7 - 4.0 K/uL   Monocytes Absolute 0.6 0.1 - 1.0 K/uL   Eosinophils Absolute 0.0 0.0 - 0.7 K/uL   Basophils Absolute 0.0 0.0 - 0.1 K/uL   RBC Morphology RARE NRBCs    Smear Review PLATELET COUNT CONFIRMED BY SMEAR   Lactate dehydrogenase     Status: Abnormal   Collection Time: 08/03/16  9:38 PM  Result Value Ref Range   LDH 763 (H) 98 - 192 U/L  Protime-INR     Status: None   Collection Time: 08/03/16  9:38 PM  Result Value Ref Range   Prothrombin Time 14.3 11.4 - 15.2 seconds   INR 1.10   APTT     Status: None   Collection Time: 08/03/16  9:38 PM  Result Value Ref Range   aPTT 26 24 - 36 seconds  Troponin I (q 6hr x 3)     Status: Abnormal   Collection Time: 08/03/16  9:38 PM  Result Value Ref Range   Troponin I 0.86 (HH) <0.03 ng/mL  Type and screen [place]     Status: None   Collection Time: 08/03/16  9:39 PM  Result Value Ref Range   ABO/RH(D) A POS    Antibody Screen NEG    Sample Expiration 08/06/2016    Dg Chest 2 View  Result Date: 08/03/2016 CLINICAL DATA:  Weakness and fatigue x4 days. No dyspnea or chest pain. EXAM: CHEST  2 VIEW COMPARISON:  06/21/2016 FINDINGS: The heart size and mediastinal contours are within normal limits. Both lungs are clear. Chronic stable moderate compressions of T8 through T12 with kyphoplasties at T10 and T12. Mild superior endplate compression of L1, osteoporotic compression of L2 and L3 and superior endplate compression of the visualized L4 vertebral body appear stable. IMPRESSION: No active cardiopulmonary disease. Chronic stable thoracolumbar compression fractures. Electronically Signed   By: Ashley Royalty M.D.   On: 08/03/2016 19:32   Ct Head Wo Contrast  Result Date: 08/03/2016 CLINICAL DATA:  Generalized weakness EXAM: CT HEAD  WITHOUT CONTRAST TECHNIQUE: Contiguous axial images were obtained from the base of the skull through the vertex without intravenous contrast. COMPARISON:  None. FINDINGS: Brain: No evidence of acute infarction, hemorrhage, hydrocephalus, extra-axial collection or mass lesion/mass effect. Vascular: No hyperdense vessel or unexpected calcification. Skull: Normal. Negative for fracture or focal lesion. Sinuses/Orbits: No acute finding. Other: None. IMPRESSION: No acute intracranial abnormality is noted. Electronically Signed   By: Inez Catalina M.D.   On: 08/03/2016 20:18     ASSESSMENT: Carrie Huber is a 66 y.o. female w/ HTN, multiple myeloma (s/p auto HSCT 2010), PE, anemia, CKD p/w weakness. She has no know coronary history, however now presents with a positive troponin among multiple other metabolic and laboratory abnormalities (worsening renal function, worsening anemia, elevated bilirubin, elevated LDH, acute thrombocytopenia). Given the  constellation of abnormalities, I do not believe that her presentation is due to acute coronary syndrome. I am more concerned about either recurrence of her malignancy, sepsis, or medication (lenalidomide) toxicity. Given her severe thrombocytopenia and low likelihood for primary ACS, I would recommend against treating her for ACS with heparin at this time.   PLAN/DISCUSSION: - obtain echo in AM - recommend against treating with heparin - repeat troponin in 6 hours - recommend starting aspirin 25m daily, though this may be deferred if new anemia is suspected to be due to acute blood loss - obtain blood and urine cultures - escalate antihypertensive therapy to achieve target systolic blood pressure around 130 (recommend increasing carvedilol) - await recommendations from oncology  - cardiology will continue to follow  AMarcie Mowers MD

## 2016-08-03 NOTE — Progress Notes (Signed)
Pharmacy Brief note:  Revlimid  By [place] the following medication is automatically held when any of the following lab values occur. Lenalidomide (Revlimid) hold criteria  ANC < 1  Pltc < 50K  AST or ALT > 3x ULN  Bili > 1.5x ULN  Acute venous thromboembolic event  Active infection 6/1:  Plts=7  Revlimid has been held (discontinued from profile) per policy.   Thanks Dorrene German 08/03/2016 10:09 PM

## 2016-08-03 NOTE — ED Notes (Signed)
[facility] Picking up patient to take to [facility].

## 2016-08-03 NOTE — ED Notes (Signed)
Lab adding on mag

## 2016-08-03 NOTE — ED Notes (Signed)
Patient in xray 

## 2016-08-04 ENCOUNTER — Observation Stay (HOSPITAL_COMMUNITY): Payer: BC Managed Care – PPO

## 2016-08-04 ENCOUNTER — Encounter (HOSPITAL_COMMUNITY): Payer: Self-pay | Admitting: Interventional Radiology

## 2016-08-04 DIAGNOSIS — I161 Hypertensive emergency: Secondary | ICD-10-CM

## 2016-08-04 DIAGNOSIS — M311 Thrombotic microangiopathy: Secondary | ICD-10-CM

## 2016-08-04 DIAGNOSIS — N182 Chronic kidney disease, stage 2 (mild): Secondary | ICD-10-CM

## 2016-08-04 DIAGNOSIS — R748 Abnormal levels of other serum enzymes: Secondary | ICD-10-CM

## 2016-08-04 DIAGNOSIS — D696 Thrombocytopenia, unspecified: Secondary | ICD-10-CM

## 2016-08-04 DIAGNOSIS — C9000 Multiple myeloma not having achieved remission: Principal | ICD-10-CM

## 2016-08-04 DIAGNOSIS — D649 Anemia, unspecified: Secondary | ICD-10-CM

## 2016-08-04 DIAGNOSIS — E876 Hypokalemia: Secondary | ICD-10-CM

## 2016-08-04 DIAGNOSIS — D594 Other nonautoimmune hemolytic anemias: Secondary | ICD-10-CM

## 2016-08-04 DIAGNOSIS — R531 Weakness: Secondary | ICD-10-CM

## 2016-08-04 DIAGNOSIS — N179 Acute kidney failure, unspecified: Secondary | ICD-10-CM

## 2016-08-04 HISTORY — PX: IR US GUIDE VASC ACCESS RIGHT: IMG2390

## 2016-08-04 HISTORY — PX: IR FLUORO GUIDE CV LINE RIGHT: IMG2283

## 2016-08-04 LAB — BASIC METABOLIC PANEL
Anion gap: 8 (ref 5–15)
BUN: 13 mg/dL (ref 6–20)
CHLORIDE: 105 mmol/L (ref 101–111)
CO2: 24 mmol/L (ref 22–32)
Calcium: 7.9 mg/dL — ABNORMAL LOW (ref 8.9–10.3)
Creatinine, Ser: 1.16 mg/dL — ABNORMAL HIGH (ref 0.44–1.00)
GFR calc Af Amer: 56 mL/min — ABNORMAL LOW (ref >60)
GFR calc non Af Amer: 49 mL/min — ABNORMAL LOW (ref >60)
Glucose, Bld: 125 mg/dL — ABNORMAL HIGH (ref 65–99)
Potassium: 3.2 mmol/L — ABNORMAL LOW (ref 3.5–5.1)
Sodium: 137 mmol/L (ref 135–145)

## 2016-08-04 LAB — IRON AND TIBC
Iron: 43 ug/dL (ref 28–170)
Saturation Ratios: 17 % (ref 10.4–31.8)
TIBC: 246 ug/dL — ABNORMAL LOW (ref 250–450)
UIBC: 203 ug/dL

## 2016-08-04 LAB — BPAM PLATELET PHERESIS
BLOOD PRODUCT EXPIRATION DATE: 201806022359
ISSUE DATE / TIME: 201806020246
UNIT TYPE AND RH: 6200

## 2016-08-04 LAB — CBC WITH DIFFERENTIAL/PLATELET
BAND NEUTROPHILS: 18 %
BASOS ABS: 0 10*3/uL (ref 0.0–0.1)
Basophils Relative: 0 %
Blasts: 0 %
EOS ABS: 0 10*3/uL (ref 0.0–0.7)
EOS PCT: 1 %
HEMATOCRIT: 22.4 % — AB (ref 36.0–46.0)
Hemoglobin: 7.1 g/dL — ABNORMAL LOW (ref 12.0–15.0)
LYMPHS ABS: 1.1 10*3/uL (ref 0.7–4.0)
Lymphocytes Relative: 30 %
MCH: 28.6 pg (ref 26.0–34.0)
MCHC: 31.7 g/dL (ref 30.0–36.0)
MCV: 90.3 fL (ref 78.0–100.0)
MONOS PCT: 8 %
Metamyelocytes Relative: 0 %
Monocytes Absolute: 0.3 10*3/uL (ref 0.1–1.0)
Myelocytes: 0 %
NEUTROS ABS: 2.2 10*3/uL (ref 1.7–7.7)
Neutrophils Relative %: 40 %
OTHER: 3 %
Platelets: 43 10*3/uL — ABNORMAL LOW (ref 150–400)
Promyelocytes Absolute: 0 %
RBC: 2.48 MIL/uL — AB (ref 3.87–5.11)
RDW: 22.3 % — AB (ref 11.5–15.5)
WBC: 3.8 10*3/uL — ABNORMAL LOW (ref 4.0–10.5)
nRBC: 3 /100 WBC — ABNORMAL HIGH

## 2016-08-04 LAB — PREPARE PLATELET PHERESIS: Unit division: 0

## 2016-08-04 LAB — HEPATIC FUNCTION PANEL
ALBUMIN: 2.6 g/dL — AB (ref 3.5–5.0)
ALK PHOS: 62 U/L (ref 38–126)
ALT: 15 U/L (ref 14–54)
AST: 25 U/L (ref 15–41)
BILIRUBIN TOTAL: 2.8 mg/dL — AB (ref 0.3–1.2)
Bilirubin, Direct: 0.3 mg/dL (ref 0.1–0.5)
Indirect Bilirubin: 2.5 mg/dL — ABNORMAL HIGH (ref 0.3–0.9)
Total Protein: 7 g/dL (ref 6.5–8.1)

## 2016-08-04 LAB — HIV ANTIBODY (ROUTINE TESTING W REFLEX): HIV Screen 4th Generation wRfx: NONREACTIVE

## 2016-08-04 LAB — RETICULOCYTES
RBC.: 2.54 MIL/uL — AB (ref 3.87–5.11)
RETIC COUNT ABSOLUTE: 119.4 10*3/uL (ref 19.0–186.0)
Retic Ct Pct: 4.7 % — ABNORMAL HIGH (ref 0.4–3.1)

## 2016-08-04 LAB — DIRECT ANTIGLOBULIN TEST (NOT AT ARMC)
DAT, IGG: NEGATIVE
DAT, complement: NEGATIVE

## 2016-08-04 LAB — ABO/RH: ABO/RH(D): A POS

## 2016-08-04 LAB — PREPARE RBC (CROSSMATCH)

## 2016-08-04 LAB — PROTIME-INR
INR: 1.12
Prothrombin Time: 14.5 seconds (ref 11.4–15.2)

## 2016-08-04 LAB — SAVE SMEAR

## 2016-08-04 LAB — VITAMIN B12: VITAMIN B 12: 140 pg/mL — AB (ref 180–914)

## 2016-08-04 LAB — FERRITIN: Ferritin: 585 ng/mL — ABNORMAL HIGH (ref 11–307)

## 2016-08-04 MED ORDER — ACD FORMULA A 0.73-2.45-2.2 GM/100ML VI SOLN
500.0000 mL | Status: DC
  Administered 2016-08-04: 100 mL via INTRAVENOUS
  Administered 2016-08-04 – 2016-08-06 (×2): 500 mL via INTRAVENOUS
  Filled 2016-08-04: qty 500

## 2016-08-04 MED ORDER — CARVEDILOL 6.25 MG PO TABS
6.2500 mg | ORAL_TABLET | Freq: Two times a day (BID) | ORAL | Status: DC
  Administered 2016-08-04 – 2016-08-09 (×11): 6.25 mg via ORAL
  Filled 2016-08-04 (×11): qty 1

## 2016-08-04 MED ORDER — ACD FORMULA A 0.73-2.45-2.2 GM/100ML VI SOLN
Status: AC
  Administered 2016-08-04: 500 mL via INTRAVENOUS
  Filled 2016-08-04: qty 500

## 2016-08-04 MED ORDER — CALCIUM CARBONATE ANTACID 500 MG PO CHEW
CHEWABLE_TABLET | ORAL | Status: AC
  Administered 2016-08-04: 400 mg via ORAL
  Filled 2016-08-04: qty 4

## 2016-08-04 MED ORDER — CALCIUM CARBONATE ANTACID 500 MG PO CHEW
2.0000 | CHEWABLE_TABLET | ORAL | Status: AC
  Administered 2016-08-04: 400 mg via ORAL

## 2016-08-04 MED ORDER — SODIUM CHLORIDE 0.9 % IV SOLN: Freq: Once | INTRAVENOUS | Status: DC

## 2016-08-04 MED ORDER — METHYLPREDNISOLONE SODIUM SUCC 125 MG IJ SOLR
80.0000 mg | Freq: Once | INTRAMUSCULAR | Status: AC
  Administered 2016-08-04: 80 mg via INTRAVENOUS

## 2016-08-04 MED ORDER — DIPHENHYDRAMINE HCL 25 MG PO CAPS: 25.0000 mg | ORAL_CAPSULE | Freq: Four times a day (QID) | ORAL | Status: DC | PRN

## 2016-08-04 MED ORDER — METHYLPREDNISOLONE SODIUM SUCC 125 MG IJ SOLR
INTRAMUSCULAR | Status: AC
  Administered 2016-08-04: 80 mg via INTRAVENOUS
  Filled 2016-08-04: qty 2

## 2016-08-04 MED ORDER — LIDOCAINE HCL 1 % IJ SOLN
INTRAMUSCULAR | Status: AC
  Filled 2016-08-04: qty 20

## 2016-08-04 MED ORDER — ACD FORMULA A 0.73-2.45-2.2 GM/100ML VI SOLN
Status: AC
  Administered 2016-08-04: 100 mL via INTRAVENOUS
  Filled 2016-08-04: qty 500

## 2016-08-04 MED ORDER — ANTICOAGULANT SODIUM CITRATE 4% (200MG/5ML) IV SOLN
5.0000 mL | Freq: Once | Status: AC
Start: 2016-08-04 — End: 2016-08-04
  Administered 2016-08-04: 5 mL
  Filled 2016-08-04 (×2): qty 250

## 2016-08-04 MED ORDER — LIDOCAINE HCL (PF) 1 % IJ SOLN
INTRAMUSCULAR | Status: DC | PRN
  Administered 2016-08-04 (×2): 5 mL

## 2016-08-04 MED ORDER — HEPARIN SODIUM (PORCINE) 1000 UNIT/ML IJ SOLN
INTRAMUSCULAR | Status: AC
  Administered 2016-08-04: 6 mL
  Filled 2016-08-04: qty 1

## 2016-08-04 MED ORDER — SODIUM CHLORIDE 0.9 % IV SOLN
2.0000 g | Freq: Once | INTRAVENOUS | Status: DC
  Filled 2016-08-04 (×2): qty 20

## 2016-08-04 MED ORDER — ACETAMINOPHEN 325 MG PO TABS: 650.0000 mg | ORAL_TABLET | ORAL | Status: DC | PRN

## 2016-08-04 MED ORDER — SODIUM CHLORIDE 0.9 % IV SOLN
Freq: Once | INTRAVENOUS | Status: AC
  Administered 2016-08-04: 03:00:00 via INTRAVENOUS

## 2016-08-04 NOTE — Progress Notes (Signed)
PROGRESS NOTE    Carrie Huber  XYV:[id number] DOB: Oct 21, 1950 DOA: 08/03/2016 PCP: Viann Fish, RN   Chief Complaint  Patient presents with  . Weakness    Brief Narrative:  HPI on 08/03/2016 by Dr Trixie Dredge is a 66 y.o. female with medical history significant of hypertension, multiple myeloma (s/p of autologous bone marrow transplantation 2010), PE 7 years ago, anemia, chronic kidney disease-stage II, who presents with generalized weakness. Patient states that she has been having generalized weakness in the past 4 days, which has been progressively getting worse. She does not have unilateral weakness, numbness or tingling in extremities. No facial droop or slurred speech. Denies symptoms of UTI. No nausea, vomiting, diarrhea or abdominal pain. Patient does not have chest pain or shortness of breath. No cough, fever or chills. Patient blood pressure elevated at SBP 200s initally, then 180/89.   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 Alvy Bimler. Blood smear reviewed revealing schistocytes. Tear drop cells and basophilic stippling also noted.  Patient likely needs plasmapheresis -Discussed with Interventional radiology, plan for HD catheter to be placed today -pending ADAMTS13, haptoglobin  Multiple myeloma -Patient has been on Revlimid and follows with Dr. Benay Spice -S/p bone marrow transplant in 2010 -Oncology consulted and appreciated -?Myelodysplastic syndrome -24 hour urine collection, kappa/lambda, MM panel -may need bone marrow biopsy  Elevated troponin -Currently chest pain free -Suspect secondary to severe anemia/demand ischemia -Troponin -Cannot heparinize given severe thrombocytopenia -Cardiology consulted  and appreciated -hemoglobin A1c, lipid panel pending -No aspirin at this time given thrombocytopenia -Continue statin  Acute kidney injury -Suspect prerenal given drop in hemoglobin -baseline creatinine 1, upon admission 1.33 -Currently 1.16 -Continue to monitor BMP  Generalized weakness -Likely related to the above -Continue to monitor   Hypokalemia/Hypomagnesemia -Will replace and continue to monitor BMP  Accelerated hypertension -BP improved -Continue amlodipine -continue to monitor closely   DVT Prophylaxis  SCDs  Code Status: Full  Family Communication: Husband at bedside, son via phone  Disposition Plan: Admitted. Dispo TBD  Consultants Oncology Interventional radiology Cardiology  Procedures  None  Antibiotics   Anti-infectives    None      Subjective:   Carrie Huber seen and examined today.  Patient feels tired, hungry, and would like to shower. Denies chest pain, shortness of breath, abdominal pain, N/V/D/C.     Objective:   Vitals:   08/04/16 0438 08/04/16 0452 08/04/16 0523 08/04/16 0730  BP: 120/69 111/70 112/71 129/74  Pulse: 81 80 78 67  Resp: '17 16 17 16  ' Temp: 99.1 F (37.3 C) 99.4 F (37.4 C) 99.4 F (37.4 C) 98.9 F (37.2 C)  TempSrc: Oral Oral Oral Oral  SpO2: 99% 98% 98% 99%  Weight:      Height:        Intake/Output Summary (Last 24 hours) at 08/04/16 1436 Last data filed at 08/04/16 0730  Gross per 24 hour  Intake           800.33 ml  Output              250 ml  Net           550.33 ml   Filed Weights   08/03/16 2138 08/03/16 2344  Weight: 86.2 kg (190 lb) 87.7 kg (193  lb 6.4 oz)    Exam  General: Well developed, well nourished, NAD, appears stated age  HEENT: NCAT, mucous membranes moist.   Cardiovascular: S1 S2 auscultated, no rubs, murmurs or gallops. Regular rate and rhythm.  Respiratory: Clear to auscultation bilaterally with equal chest rise  Abdomen: Soft, nontender, nondistended, + bowel  sounds  Extremities: warm dry without cyanosis clubbing or edema  Neuro: AAOx3, nonfocal  Skin: Without rashes exudates or nodules  Psych: Normal affect and demeanor with intact judgement and insight, pleasant   Data Reviewed: I have personally reviewed following labs and imaging studies  CBC:  Recent Labs Lab 08/03/16 1932 08/03/16 2138 08/04/16 0843  WBC 4.0 3.9* 3.8*  NEUTROABS 1.8 1.7 2.2  HGB 8.0* 9.4* 7.1*  HCT 24.5* 27.9* 22.4*  MCV 87.8 87.7 90.3  PLT 7* 8* 43*   Basic Metabolic Panel:  Recent Labs Lab 08/03/16 1932 08/04/16 0843  NA 139 137  K 3.0* 3.2*  CL 106 105  CO2 23 24  GLUCOSE 107* 125*  BUN 18 13  CREATININE 1.33* 1.16*  CALCIUM 8.4* 7.9*  MG 1.4*  --    GFR: Estimated Creatinine Clearance: 55.7 mL/min (A) (by C-G formula based on SCr of 1.16 mg/dL (H)). Liver Function Tests:  Recent Labs Lab 08/03/16 1932 08/04/16 0843  AST 26 25  ALT 14 15  ALKPHOS 67 62  BILITOT 3.2* 2.8*  PROT 8.0 7.0  ALBUMIN 3.3* 2.6*   No results for input(s): LIPASE, AMYLASE in the last 168 hours. No results for input(s): AMMONIA in the last 168 hours. Coagulation Profile:  Recent Labs Lab 08/03/16 2138 08/04/16 1123  INR 1.10 1.12   Cardiac Enzymes:  Recent Labs Lab 08/03/16 1932 08/03/16 2138  TROPONINI 0.60* 0.86*   BNP (last 3 results) No results for input(s): PROBNP in the last 8760 hours. HbA1C: No results for input(s): HGBA1C in the last 72 hours. CBG:  Recent Labs Lab 08/03/16 1852  GLUCAP 102*   Lipid Profile: No results for input(s): CHOL, HDL, LDLCALC, TRIG, CHOLHDL, LDLDIRECT in the last 72 hours. Thyroid Function Tests: No results for input(s): TSH, T4TOTAL, FREET4, T3FREE, THYROIDAB in the last 72 hours. Anemia Panel:  Recent Labs  08/04/16 1123  VITAMINB12 140*  FERRITIN 585*  TIBC 246*  IRON 43  RETICCTPCT 4.7*   Urine analysis:    Component Value Date/Time   COLORURINE YELLOW 08/03/2016 1839    APPEARANCEUR HAZY (A) 08/03/2016 1839   LABSPEC 1.022 08/03/2016 1839   LABSPEC 1.020 11/03/2009 1152   PHURINE 5.0 08/03/2016 1839   GLUCOSEU NEGATIVE 08/03/2016 1839   HGBUR LARGE (A) 08/03/2016 1839   BILIRUBINUR NEGATIVE 08/03/2016 1839   BILIRUBINUR Negative 11/03/2009 1152   KETONESUR NEGATIVE 08/03/2016 1839   PROTEINUR >=300 (A) 08/03/2016 1839   UROBILINOGEN 0.2 11/05/2009 1430   NITRITE NEGATIVE 08/03/2016 1839   LEUKOCYTESUR TRACE (A) 08/03/2016 1839   LEUKOCYTESUR Trace 11/03/2009 1152   Sepsis Labs: '@LABRCNTIP' (procalcitonin:4,lacticidven:4)  )No results found for this or any previous visit (from the past 240 hour(s)).    Radiology Studies: Dg Chest 2 View  Result Date: 08/03/2016 CLINICAL DATA:  Weakness and fatigue x4 days. No dyspnea or chest pain. EXAM: CHEST  2 VIEW COMPARISON:  06/21/2016 FINDINGS: The heart size and mediastinal contours are within normal limits. Both lungs are clear. Chronic stable moderate compressions of T8 through T12 with kyphoplasties at T10 and T12. Mild superior endplate compression of L1, osteoporotic compression of L2 and L3 and superior  endplate compression of the visualized L4 vertebral body appear stable. IMPRESSION: No active cardiopulmonary disease. Chronic stable thoracolumbar compression fractures. Electronically Signed   By: Ashley Royalty M.D.   On: 08/03/2016 19:32   Ct Head Wo Contrast  Result Date: 08/03/2016 CLINICAL DATA:  Generalized weakness EXAM: CT HEAD WITHOUT CONTRAST TECHNIQUE: Contiguous axial images were obtained from the base of the skull through the vertex without intravenous contrast. COMPARISON:  None. FINDINGS: Brain: No evidence of acute infarction, hemorrhage, hydrocephalus, extra-axial collection or mass lesion/mass effect. Vascular: No hyperdense vessel or unexpected calcification. Skull: Normal. Negative for fracture or focal lesion. Sinuses/Orbits: No acute finding. Other: None. IMPRESSION: No acute intracranial  abnormality is noted. Electronically Signed   By: Inez Catalina M.D.   On: 08/03/2016 20:18   Ir Fluoro Guide Cv Line Right  Result Date: 08/04/2016 INDICATION: History of multiple myeloma, now with severe thrombocytopenia, worrisome for TTP. Please perform temporary dialysis catheter placement for the initiation of plasmapheresis. EXAM: NON-TUNNELED CENTRAL VENOUS HEMODIALYSIS CATHETER PLACEMENT WITH ULTRASOUND AND FLUOROSCOPIC GUIDANCE COMPARISON:  None. MEDICATIONS: None FLUOROSCOPY TIME:  54 seconds (6 mGy) COMPLICATIONS: None immediate. PROCEDURE: Informed written consent was obtained from the patient after a discussion of the risks, benefits, and alternatives to treatment. Questions regarding the procedure were encouraged and answered. The right neck and chest were prepped with chlorhexidine in a sterile fashion, and a sterile drape was applied covering the operative field. Maximum barrier sterile technique with sterile gowns and gloves were used for the procedure. A timeout was performed prior to the initiation of the procedure. After creating a small venotomy incision, a micropuncture kit was utilized to access the right internal jugular vein under direct, real-time ultrasound guidance after the overlying soft tissues were anesthetized with 1% lidocaine with epinephrine. Ultrasound image documentation was performed. The microwire was kinked to measure appropriate catheter length. A stiff glidewire was advanced to the level of the IVC. Under fluoroscopic guidance, the venotomy was serially dilated, ultimately allowing placement of a 20 cm temporary [place] catheter with tip ultimately terminating within the superior aspect of the right atrium. Final catheter positioning was confirmed and documented with a spot radiographic image. The catheter aspirates and flushes normally. The catheter was flushed with appropriate volume heparin dwells. The catheter exit site was secured with a 0-Prolene retention suture.  A dressing was placed. The patient tolerated the procedure well without immediate post procedural complication. IMPRESSION: Successful placement of a right internal jugular approach 20 cm temporary dialysis catheter with tip terminating with in the superior aspect of the right atrium. The catheter is ready for immediate use. PLAN: This catheter may be converted to a tunneled dialysis catheter at a later date as indicated. Electronically Signed   By: Sandi Mariscal M.D.   On: 08/04/2016 12:51   Ir US Guide Vasc Access Right  Result Date: 08/04/2016 INDICATION: History of multiple myeloma, now with severe thrombocytopenia, worrisome for TTP. Please perform temporary dialysis catheter placement for the initiation of plasmapheresis. EXAM: NON-TUNNELED CENTRAL VENOUS HEMODIALYSIS CATHETER PLACEMENT WITH ULTRASOUND AND FLUOROSCOPIC GUIDANCE COMPARISON:  None. MEDICATIONS: None FLUOROSCOPY TIME:  54 seconds (6 mGy) COMPLICATIONS: None immediate. PROCEDURE: Informed written consent was obtained from the patient after a discussion of the risks, benefits, and alternatives to treatment. Questions regarding the procedure were encouraged and answered. The right neck and chest were prepped with chlorhexidine in a sterile fashion, and a sterile drape was applied covering the operative field. Maximum barrier sterile technique with sterile gowns and  gloves were used for the procedure. A timeout was performed prior to the initiation of the procedure. After creating a small venotomy incision, a micropuncture kit was utilized to access the right internal jugular vein under direct, real-time ultrasound guidance after the overlying soft tissues were anesthetized with 1% lidocaine with epinephrine. Ultrasound image documentation was performed. The microwire was kinked to measure appropriate catheter length. A stiff glidewire was advanced to the level of the IVC. Under fluoroscopic guidance, the venotomy was serially dilated, ultimately  allowing placement of a 20 cm temporary [place] catheter with tip ultimately terminating within the superior aspect of the right atrium. Final catheter positioning was confirmed and documented with a spot radiographic image. The catheter aspirates and flushes normally. The catheter was flushed with appropriate volume heparin dwells. The catheter exit site was secured with a 0-Prolene retention suture. A dressing was placed. The patient tolerated the procedure well without immediate post procedural complication. IMPRESSION: Successful placement of a right internal jugular approach 20 cm temporary dialysis catheter with tip terminating with in the superior aspect of the right atrium. The catheter is ready for immediate use. PLAN: This catheter may be converted to a tunneled dialysis catheter at a later date as indicated. Electronically Signed   By: Sandi Mariscal M.D.   On: 08/04/2016 12:51     Scheduled Meds: . calcium carbonate      . methylPREDNISolone sodium succinate      . amLODipine  10 mg Oral Daily  . atorvastatin  40 mg Oral q1800  . calcium carbonate  2 tablet Oral Q3H  . carvedilol  6.25 mg Oral BID WC  . hydrALAZINE  10 mg Intravenous Once  . lidocaine      . methylPREDNISolone (SOLU-MEDROL) injection  80 mg Intravenous Once   Continuous Infusions: . citrate dextrose    . sodium chloride 125 mL/hr (08/03/16 2244)  . sodium chloride    . sodium chloride    . anticoagulant sodium citrate    . calcium gluconate IVPB    . citrate dextrose       LOS: 1 day   Time Spent in minutes   45 minutes  Pahola Dimmitt D.O. on 08/04/2016 at 2:36 PM  Between 7am to 7pm - Pager - [phone]  After 7pm go to [email or web address] - password [email or web address]  And look for the night coverage person covering for me after hours  [facility] Office  [phone]

## 2016-08-04 NOTE — Progress Notes (Signed)
Progress Note  Patient Name: Carrie Huber Date of Encounter: 08/04/2016  Primary Cardiologist: Bronson Ing (new)  Subjective   Pt denies chest pain, palpitations, and shortness of breath. Diagnosed with multiple myeloma 9 yrs ago.  Inpatient Medications    Scheduled Meds: . amLODipine  10 mg Oral Daily  . atorvastatin  40 mg Oral q1800  . carvedilol  6.25 mg Oral BID WC  . hydrALAZINE  10 mg Intravenous Once   Continuous Infusions: . sodium chloride 125 mL/hr (08/03/16 2244)  . sodium chloride    . sodium chloride     PRN Meds: acetaminophen, ALPRAZolam, hydrALAZINE, morphine injection, nitroGLYCERIN, ondansetron (ZOFRAN) IV, zolpidem   Vital Signs    Vitals:   08/04/16 0438 08/04/16 0452 08/04/16 0523 08/04/16 0730  BP: 120/69 111/70 112/71 129/74  Pulse: 81 80 78 67  Resp: '17 16 17 16  ' Temp: 99.1 F (37.3 C) 99.4 F (37.4 C) 99.4 F (37.4 C) 98.9 F (37.2 C)  TempSrc: Oral Oral Oral Oral  SpO2: 99% 98% 98% 99%  Weight:      Height:        Intake/Output Summary (Last 24 hours) at 08/04/16 0837 Last data filed at 08/04/16 0730  Gross per 24 hour  Intake           800.33 ml  Output              250 ml  Net           550.33 ml   Filed Weights   08/03/16 2138 08/03/16 2344  Weight: 190 lb (86.2 kg) 193 lb 6.4 oz (87.7 kg)    Telemetry    NSR - Personally Reviewed  ECG     - Personally Reviewed  Physical Exam   GEN: No acute distress.   Neck: No JVD Cardiac: RRR, no murmurs, rubs, or gallops.  Respiratory: Clear to auscultation bilaterally. GI: Soft, nontender, non-distended  MS: No edema; No deformity. Neuro:  Nonfocal  Psych: Normal affect   Labs    Chemistry Recent Labs Lab 08/03/16 1932  NA 139  K 3.0*  CL 106  CO2 23  GLUCOSE 107*  BUN 18  CREATININE 1.33*  CALCIUM 8.4*  PROT 8.0  ALBUMIN 3.3*  AST 26  ALT 14  ALKPHOS 67  BILITOT 3.2*  GFRNONAA 41*  GFRAA 48*  ANIONGAP 10     Hematology Recent Labs Lab  08/03/16 1932 08/03/16 2138  WBC 4.0 3.9*  RBC 2.79* 3.18*  HGB 8.0* 9.4*  HCT 24.5* 27.9*  MCV 87.8 87.7  MCH 28.7 29.6  MCHC 32.7 33.7  RDW 21.3* 21.7*  PLT 7* 8*    Cardiac Enzymes Recent Labs Lab 08/03/16 1932 08/03/16 2138  TROPONINI 0.60* 0.86*   No results for input(s): TROPIPOC in the last 168 hours.   BNPNo results for input(s): BNP, PROBNP in the last 168 hours.   DDimer No results for input(s): DDIMER in the last 168 hours.   Radiology    Dg Chest 2 View  Result Date: 08/03/2016 CLINICAL DATA:  Weakness and fatigue x4 days. No dyspnea or chest pain. EXAM: CHEST  2 VIEW COMPARISON:  06/21/2016 FINDINGS: The heart size and mediastinal contours are within normal limits. Both lungs are clear. Chronic stable moderate compressions of T8 through T12 with kyphoplasties at T10 and T12. Mild superior endplate compression of L1, osteoporotic compression of L2 and L3 and superior endplate compression of the visualized L4 vertebral body appear stable.  IMPRESSION: No active cardiopulmonary disease. Chronic stable thoracolumbar compression fractures. Electronically Signed   By: Ashley Royalty M.D.   On: 08/03/2016 19:32   Ct Head Wo Contrast  Result Date: 08/03/2016 CLINICAL DATA:  Generalized weakness EXAM: CT HEAD WITHOUT CONTRAST TECHNIQUE: Contiguous axial images were obtained from the base of the skull through the vertex without intravenous contrast. COMPARISON:  None. FINDINGS: Brain: No evidence of acute infarction, hemorrhage, hydrocephalus, extra-axial collection or mass lesion/mass effect. Vascular: No hyperdense vessel or unexpected calcification. Skull: Normal. Negative for fracture or focal lesion. Sinuses/Orbits: No acute finding. Other: None. IMPRESSION: No acute intracranial abnormality is noted. Electronically Signed   By: Inez Catalina M.D.   On: 08/03/2016 20:18    Cardiac Studies   Echo pending  Patient Profile     66 y.o. female with HTN, multiple myeloma (s/p  auto HSCT 2010), PE, anemia, CKD p/w weakness. She has no know coronary history, however now presents with a positive troponin among multiple other metabolic and laboratory abnormalities (worsening renal function, worsening anemia, elevated bilirubin, elevated LDH, acute thrombocytopenia).   Given the constellation of abnormalities, I do not believe that her presentation is due to acute coronary syndrome. I am more concerned about either recurrence of her malignancy, sepsis, or medication (lenalidomide) toxicity. Given her severe thrombocytopenia and low likelihood for primary ACS, I would recommend against treating her for ACS with heparin at this time.    Assessment & Plan    1. Elevated troponin: I do not believe this represents an acute coronary syndrome. Denies chest pain and shortness of breath. Echocardiogram pending. Given severe anemia and thrombocytopenia, recommend against any antiplatelet therapy including ASA and heparin. This may represent malignancy recurrence or lenalidomide toxicity perhaps. Ok to continue Lipitor for now.  2. HTN: Controlled today on Coreg and amlodipine. No changes.  3. Multiple myeloma with profound anemia and thrombocytopenia: Oncology to see. Discussed with internal medicine.   Signed, Kate Sable, MD  08/04/2016, 8:37 AM

## 2016-08-04 NOTE — Consult Note (Signed)
[facility] NOTE  Patient Care Team: Viann Fish, RN as PCP - General  CHIEF COMPLAINTS/PURPOSE OF CONSULTATION:  Pancytopenia, background history of myeloma  HISTORY OF PRESENTING ILLNESS:  Carrie Huber 66 y.o. female is admitted from ER last night for generalized malaise, fatigue, increased bruising and weakness.  She is currently on Revlimid for maintenance therapy for myeloma,s/p BM transplant She has multiple myeloma, IgG kappa, status post 7 cycles of Revlimid/Decadron and Velcade with marked clinical and laboratory improvement. She is status post melphalan conditioning, followed by an autologous stem cell infusion 09/10/2008. She began maintenance Revlimid 01/04/2009. She has persistent mild elevation of the kappa and lambda light chains. She is on reduced dose Revlimid due to pancytopenia  She complained of recent bruising but denies bleeding, such as spontaneous epistaxis, hematuria, melena or hematochezia The patient denies history of liver disease, exposure to heparin, history of cardiac murmur/prior cardiovascular surgery or recent new medications No recent nausea, diarrhea, abdominal problem or new bone pain Despite elevated troponin, she has no chest pain, dyspnea or pre-syncopal episode. No recent neurological deficit, headaches or changes in vision  Her most recent labs in April 2018 showed mild leukopenia which was chronic but hemoglobin and platelet counts are within normal range. Her CBC from admission last night on 08/03/2016 show white count of 4.0, hemoglobin 8.0 and platelet count of 7000 Repeat CBC again show white count 3.9, hemoglobin 9.4 and platelet count of 8000 She received 1 unit of platelet transfusion last night This morning, her white count is 3.8, hemoglobin 7.1 and platelet count of 43,000. Serum chemistries showed elevated total bilirubin. Repeat blood work confirmed indirect hyperbilirubinemia with significant elevated  LDH. Serum troponin is elevated at 0.6 yesterday, increased to 0.8  MEDICAL HISTORY:  Past Medical History:  Diagnosis Date  . Anemia   . Blood transfusion    IRRADIATE ALL PRODUCTS  . Eczema    Left foot  . Multiple myeloma 2009  . Multiple Myeloma 2009  . Pulmonary embolism ([place]) 11/20/2007  . 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 Tachycardia, unspecified    Chronic  . Thoracic compression fracture ([place]) 10/2007   Multiple    SURGICAL HISTORY: Past Surgical History:  Procedure Laterality Date  . FIXATION KYPHOPLASTY THORACIC SPINE  12/05/2007    SOCIAL HISTORY: Social History   Social History  . Marital status: Married    Spouse name: N/A  . Number of children: N/A  . Years of education: N/A   Occupational History  . Not on file.   Social History Main Topics  . Smoking status: Never Smoker  . Smokeless tobacco: Never Used  . Alcohol use No  . Drug use: No  . Sexual activity: Not Currently   Other Topics Concern  . Not on file   Social History Narrative  . No narrative on file    FAMILY HISTORY: History reviewed. No pertinent family history.  ALLERGIES:  is allergic to relafen [nabumetone].  MEDICATIONS:  Current Facility-Administered Medications  Medication Dose Route Frequency Provider Last Rate Last Dose  . 0.9 %  sodium chloride infusion   Intravenous Continuous Ivor Costa, MD 125 mL/hr at 08/03/16 2244 125 mL/hr at 08/03/16 2244  . 0.9 %  sodium chloride infusion   Intravenous Once Ivor Costa, MD      . 0.9 %  sodium chloride infusion   Intravenous Once Ivor Costa, MD      .  acetaminophen (TYLENOL) tablet 650 mg  650 mg Oral Q4H PRN Ivor Costa, MD   650 mg at 08/04/16 0811  . ALPRAZolam Duanne Moron) tablet 0.25 mg  0.25 mg Oral BID PRN Ivor Costa, MD      . amLODipine (NORVASC) tablet 10 mg  10 mg Oral Daily Ivor Costa, MD   10 mg at 08/04/16 1245  . atorvastatin (LIPITOR) tablet 40 mg  40 mg Oral  q1800 Ivor Costa, MD   40 mg at 08/03/16 2252  . carvedilol (COREG) tablet 6.25 mg  6.25 mg Oral BID WC Ivor Costa, MD   6.25 mg at 08/04/16 8099  . hydrALAZINE (APRESOLINE) injection 10 mg  10 mg Intravenous Once Ivor Costa, MD      . hydrALAZINE (APRESOLINE) injection 5 mg  5 mg Intravenous Q2H PRN Ivor Costa, MD      . morphine 2 MG/ML injection 2 mg  2 mg Intravenous Q4H PRN Ivor Costa, MD      . nitroGLYCERIN (NITROSTAT) SL tablet 0.4 mg  0.4 mg Sublingual Q5 min PRN Ivor Costa, MD      . ondansetron [facility]) injection 4 mg  4 mg Intravenous Q6H PRN Ivor Costa, MD      . zolpidem (AMBIEN) tablet 5 mg  5 mg Oral QHS PRN Ivor Costa, MD        REVIEW OF SYSTEMS:   Constitutional: Denies fevers, chills or abnormal night sweats Eyes: Denies blurriness of vision, double vision or watery eyes Ears, nose, mouth, throat, and face: Denies mucositis or sore throat Respiratory: Denies cough, dyspnea or wheezes Cardiovascular: Denies palpitation, chest discomfort or lower extremity swelling Gastrointestinal:  Denies nausea, heartburn or change in bowel habits Skin: Denies abnormal skin rashes Lymphatics: Denies new lymphadenopathy  Behavioral/Psych: Mood is stable, no new changes  All other systems were reviewed with the patient and are negative.  PHYSICAL EXAMINATION: ECOG PERFORMANCE STATUS: 1 - Symptomatic but completely ambulatory  Vitals:   08/04/16 0523 08/04/16 0730  BP: 112/71 129/74  Pulse: 78 67  Resp: 17 16  Temp: 99.4 F (37.4 C) 98.9 F (37.2 C)   Filed Weights   08/03/16 2138 08/03/16 2344  Weight: 190 lb (86.2 kg) 193 lb 6.4 oz (87.7 kg)    GENERAL:alert, no distress and comfortable SKIN: skin color, texture, turgor are normal, no rashes or significant lesions. Noted minor skin bruising but no petechiae EYES: normal, conjunctiva are pink and non-injected, sclera clear OROPHARYNX:no exudate, no erythema and lips, buccal mucosa, and tongue normal  NECK: supple, thyroid  normal size, non-tender, without nodularity LYMPH:  no palpable lymphadenopathy in the cervical, axillary or inguinal LUNGS: clear to auscultation and percussion with normal breathing effort HEART: regular rate & rhythm and no murmurs and no lower extremity edema ABDOMEN:abdomen soft, non-tender and normal bowel sounds Musculoskeletal:no cyanosis of digits and no clubbing  PSYCH: alert & oriented x 3 with fluent speech NEURO: no focal motor/sensory deficits  LABORATORY DATA:  I have reviewed the data as listed CBC Latest Ref Rng & Units 08/04/2016 08/03/2016 08/03/2016  WBC 4.0 - 10.5 K/uL 3.8(L) 3.9(L) 4.0  Hemoglobin 12.0 - 15.0 g/dL 7.1(L) 9.4(L) 8.0(L)  Hematocrit 36.0 - 46.0 % 22.4(L) 27.9(L) 24.5(L)  Platelets 150 - 400 K/uL 43(L) 8(LL) 7(LL)   Lab Results  Component Value Date   WBC 3.8 (L) 08/04/2016   HGB 7.1 (L) 08/04/2016   HCT 22.4 (L) 08/04/2016   PLT 43 (L) 08/04/2016   GLUCOSE 125 (H) 08/04/2016  ALT 15 08/04/2016   AST 25 08/04/2016   NA 137 08/04/2016   K 3.2 (L) 08/04/2016   CL 105 08/04/2016   CREATININE 1.16 (H) 08/04/2016   BUN 13 08/04/2016   CO2 24 08/04/2016   TSH 1.320 11/20/2007   INR 1.10 08/03/2016    I have personally reviewed her peripheral blood smear from last night (at [facility]), before platelet transfusion, which showed numerous schistocytes, occasional polychromasia, reduced white blood cell count but with normal morphology. No platelet clumping is appreciated. This morning, repeat blood smear again (I reviewed it at [facility]) showed numerous schistocyes, reduced white blood cell count with normal morphology and no signs of platelet clumping.  RADIOGRAPHIC STUDIES: I have personally reviewed the radiological images as listed and agreed with the findings in the report. Dg Chest 2 View  Result Date: 08/03/2016 CLINICAL DATA:  Weakness and fatigue x4 days. No dyspnea or chest pain. EXAM: CHEST  2 VIEW COMPARISON:  06/21/2016 FINDINGS: The heart size and  mediastinal contours are within normal limits. Both lungs are clear. Chronic stable moderate compressions of T8 through T12 with kyphoplasties at T10 and T12. Mild superior endplate compression of L1, osteoporotic compression of L2 and L3 and superior endplate compression of the visualized L4 vertebral body appear stable. IMPRESSION: No active cardiopulmonary disease. Chronic stable thoracolumbar compression fractures. Electronically Signed   By: Ashley Royalty M.D.   On: 08/03/2016 19:32   Ct Head Wo Contrast  Result Date: 08/03/2016 CLINICAL DATA:  Generalized weakness EXAM: CT HEAD WITHOUT CONTRAST TECHNIQUE: Contiguous axial images were obtained from the base of the skull through the vertex without intravenous contrast. COMPARISON:  None. FINDINGS: Brain: No evidence of acute infarction, hemorrhage, hydrocephalus, extra-axial collection or mass lesion/mass effect. Vascular: No hyperdense vessel or unexpected calcification. Skull: Normal. Negative for fracture or focal lesion. Sinuses/Orbits: No acute finding. Other: None. IMPRESSION: No acute intracranial abnormality is noted. Electronically Signed   By: Inez Catalina M.D.   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 The elevated troponin could be a signs of ischemia secondary to thrombosis I recommend central line placement and initiation of  plasmapheresis immediately We'll follow labs daily basis  Multiple myeloma Occasionally, progressive disease can cause similar picture The patient had received bone marrow transplant many years ago and had been on maintenance Revlimid Newly diagnosed myelodysplastic syndrome can also cause similar picture I will restage myeloma with 24-hour urine collection along with repeat blood work It is likely she may need repeat bone marrow biopsy as well Revlimid is placed on hold for now  Elevated troponin Echocardiogram is pending Continue supportive care  Discharge planning Not ready for discharge I discussed the plans of care with patient, her husband and her son who is a radiologist I will see her tomorrow Dr. Learta Codding will resume care next week  All questions were answered. The patient knows to call the clinic with any problems, questions or concerns. No barriers to learning was detected.  Heath Lark, MD 08/04/2016 10:28 AM

## 2016-08-04 NOTE — Procedures (Signed)
Pre-procedure Diagnosis: In need of IV access for plasmapheresis Post-procedure Diagnosis: Same  Successful placement of a non-tunneled HD catheter with tips terminating within the superior aspect of the right atrium.    Complications: None Immediate  EBL: Minimal   The catheter is ready for immediate use.   Ronny Bacon, MD Pager #: [phone]

## 2016-08-05 ENCOUNTER — Inpatient Hospital Stay (HOSPITAL_COMMUNITY): Payer: BC Managed Care – PPO

## 2016-08-05 DIAGNOSIS — E538 Deficiency of other specified B group vitamins: Secondary | ICD-10-CM

## 2016-08-05 DIAGNOSIS — I340 Nonrheumatic mitral (valve) insufficiency: Secondary | ICD-10-CM

## 2016-08-05 DIAGNOSIS — R4182 Altered mental status, unspecified: Secondary | ICD-10-CM

## 2016-08-05 LAB — THERAPEUTIC PLASMA EXCHANGE (BLOOD BANK)
PLASMA EXCHANGE: 3335
PLASMA VOLUME NEEDED: 3327
UNIT DIVISION: 0
UNIT DIVISION: 0
UNIT DIVISION: 0
Unit division: 0
Unit division: 0
Unit division: 0
Unit division: 0
Unit division: 0
Unit division: 0
Unit division: 0

## 2016-08-05 LAB — ECHOCARDIOGRAM COMPLETE
AVLVOTPG: 4 mmHg
CHL CUP DOP CALC LVOT VTI: 23.7 cm
CHL CUP MV DEC (S): 257
CHL CUP TV REG PEAK VELOCITY: 262 cm/s
E decel time: 257 msec
FS: 36 % (ref 28–44)
Height: 67 in
IV/PV OW: 1.57
LADIAMINDEX: 1.87 cm/m2
LASIZE: 37 mm
LAVOLA4C: 40.8 mL
LEFT ATRIUM END SYS DIAM: 37 mm
LV PW d: 7 mm — AB (ref 0.6–1.1)
LV TDI E'MEDIAL: 5.44
LVELAT: 7.72 cm/s
LVOT SV: 60 mL
LVOT area: 2.54 cm2
LVOT diameter: 18 mm
LVOT peak vel: 106 cm/s
Lateral S' vel: 11 cm/s
MVPKEVEL: 0.9 m/s
RV sys press: 35 mmHg
TAPSE: 17.8 mm
TDI e' lateral: 7.72
TRMAXVEL: 262 cm/s
Weight: 3164.8 oz

## 2016-08-05 LAB — URINE CULTURE

## 2016-08-05 LAB — PREPARE PLATELET PHERESIS
UNIT DIVISION: 0
Unit division: 0

## 2016-08-05 LAB — CBC WITH DIFFERENTIAL/PLATELET
BASOS ABS: 0 10*3/uL (ref 0.0–0.1)
BASOS PCT: 0 %
EOS ABS: 0 10*3/uL (ref 0.0–0.7)
Eosinophils Relative: 0 %
HCT: 23.3 % — ABNORMAL LOW (ref 36.0–46.0)
Hemoglobin: 7.4 g/dL — ABNORMAL LOW (ref 12.0–15.0)
LYMPHS PCT: 33 %
Lymphs Abs: 1.4 10*3/uL (ref 0.7–4.0)
MCH: 28.9 pg (ref 26.0–34.0)
MCHC: 31.8 g/dL (ref 30.0–36.0)
MCV: 91 fL (ref 78.0–100.0)
MONO ABS: 0.2 10*3/uL (ref 0.1–1.0)
Monocytes Relative: 6 %
Neutro Abs: 2.5 10*3/uL (ref 1.7–7.7)
Neutrophils Relative %: 61 %
PLATELETS: 30 10*3/uL — AB (ref 150–400)
RBC: 2.56 MIL/uL — AB (ref 3.87–5.11)
RDW: 22.4 % — AB (ref 11.5–15.5)
WBC: 4.1 10*3/uL (ref 4.0–10.5)

## 2016-08-05 LAB — COMPREHENSIVE METABOLIC PANEL
ALBUMIN: 2.9 g/dL — AB (ref 3.5–5.0)
ALT: 22 U/L (ref 14–54)
ANION GAP: 11 (ref 5–15)
AST: 24 U/L (ref 15–41)
Alkaline Phosphatase: 55 U/L (ref 38–126)
BILIRUBIN TOTAL: 1.3 mg/dL — AB (ref 0.3–1.2)
BUN: 15 mg/dL (ref 6–20)
CO2: 25 mmol/L (ref 22–32)
Calcium: 7.9 mg/dL — ABNORMAL LOW (ref 8.9–10.3)
Chloride: 105 mmol/L (ref 101–111)
Creatinine, Ser: 1.2 mg/dL — ABNORMAL HIGH (ref 0.44–1.00)
GFR calc non Af Amer: 47 mL/min — ABNORMAL LOW (ref >60)
GFR, EST AFRICAN AMERICAN: 54 mL/min — AB (ref >60)
GLUCOSE: 143 mg/dL — AB (ref 65–99)
POTASSIUM: 3.5 mmol/L (ref 3.5–5.1)
SODIUM: 141 mmol/L (ref 135–145)
TOTAL PROTEIN: 6.3 g/dL — AB (ref 6.5–8.1)

## 2016-08-05 LAB — BPAM PLATELET PHERESIS
BLOOD PRODUCT EXPIRATION DATE: 201806032359
Blood Product Expiration Date: 201806022359
ISSUE DATE / TIME: 201806020246
ISSUE DATE / TIME: 201806020451
UNIT TYPE AND RH: 6200
Unit Type and Rh: 5100

## 2016-08-05 LAB — LACTATE DEHYDROGENASE: LDH: 318 U/L — ABNORMAL HIGH (ref 98–192)

## 2016-08-05 LAB — RETICULOCYTES
RBC.: 2.56 MIL/uL — AB (ref 3.87–5.11)
RETIC COUNT ABSOLUTE: 161.3 10*3/uL (ref 19.0–186.0)
RETIC CT PCT: 6.3 % — AB (ref 0.4–3.1)

## 2016-08-05 LAB — MAGNESIUM: MAGNESIUM: 2 mg/dL (ref 1.7–2.4)

## 2016-08-05 LAB — PREPARE RBC (CROSSMATCH)

## 2016-08-05 MED ORDER — ACETAMINOPHEN 325 MG PO TABS: 650.0000 mg | ORAL_TABLET | ORAL | Status: DC | PRN

## 2016-08-05 MED ORDER — CALCIUM CARBONATE ANTACID 500 MG PO CHEW
2.0000 | CHEWABLE_TABLET | ORAL | Status: AC
  Administered 2016-08-05 (×2): 400 mg via ORAL
  Filled 2016-08-05 (×2): qty 2

## 2016-08-05 MED ORDER — CYANOCOBALAMIN 1000 MCG/ML IJ SOLN
1000.0000 ug | Freq: Every day | INTRAMUSCULAR | Status: DC
  Administered 2016-08-06 – 2016-08-09 (×3): 1000 ug via INTRAMUSCULAR
  Filled 2016-08-05 (×6): qty 1

## 2016-08-05 MED ORDER — SODIUM CHLORIDE 0.9 % IV SOLN: Freq: Once | INTRAVENOUS | Status: DC

## 2016-08-05 MED ORDER — FOLIC ACID 1 MG PO TABS
1.0000 mg | ORAL_TABLET | Freq: Every day | ORAL | Status: DC
  Administered 2016-08-05 – 2016-08-09 (×5): 1 mg via ORAL
  Filled 2016-08-05 (×5): qty 1

## 2016-08-05 MED ORDER — SODIUM CHLORIDE 0.9 % IV SOLN
2.0000 g | Freq: Once | INTRAVENOUS | Status: AC
  Administered 2016-08-05: 2 g via INTRAVENOUS
  Filled 2016-08-05: qty 20

## 2016-08-05 MED ORDER — ANTICOAGULANT SODIUM CITRATE 4% (200MG/5ML) IV SOLN
5.0000 mL | Freq: Once | Status: AC
  Administered 2016-08-05: 5 mL
  Filled 2016-08-05: qty 250

## 2016-08-05 MED ORDER — DIPHENHYDRAMINE HCL 25 MG PO CAPS: 25.0000 mg | ORAL_CAPSULE | Freq: Four times a day (QID) | ORAL | Status: DC | PRN

## 2016-08-05 MED ORDER — ACD FORMULA A 0.73-2.45-2.2 GM/100ML VI SOLN
500.0000 mL | Status: DC
  Administered 2016-08-05: 500 mL via INTRAVENOUS
  Filled 2016-08-05: qty 500

## 2016-08-05 MED ORDER — ACD FORMULA A 0.73-2.45-2.2 GM/100ML VI SOLN
Status: AC
  Filled 2016-08-05: qty 500

## 2016-08-05 MED ORDER — LORAZEPAM 2 MG/ML IJ SOLN
1.0000 mg | Freq: Once | INTRAMUSCULAR | Status: DC
  Filled 2016-08-05: qty 1

## 2016-08-05 NOTE — Progress Notes (Signed)
PROGRESS NOTE    PRISMA DECARLO  PTW:[id number] DOB: August 17, 1950 DOA: 08/03/2016 PCP: Viann Fish, RN   Chief Complaint  Patient presents with  . Weakness    Brief Narrative:  HPI on 08/03/2016 by Dr Trixie Dredge is a 66 y.o. female with medical history significant of hypertension, multiple myeloma (s/p of autologous bone marrow transplantation 2010), PE 7 years ago, anemia, chronic kidney disease-stage II, who presents with generalized weakness. Patient states that she has been having generalized weakness in the past 4 days, which has been progressively getting worse. She does not have unilateral weakness, numbness or tingling in extremities. No facial droop or slurred speech. Denies symptoms of UTI. No nausea, vomiting, diarrhea or abdominal pain. Patient does not have chest pain or shortness of breath. No cough, fever or chills. Patient blood pressure elevated at SBP 200s initally, then 180/89.   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 Alvy Bimler. Blood smear reviewed revealing schistocytes. Tear drop cells and basophilic stippling also noted.  Patient likely needs plasmapheresis -Interventional radiology consulted and appreciated, s/p HD catheter placement -pending ADAMTS13, haptoglobin -patient started plasmapheresis on 08/04/2016 -Plan for additional blood transfusion during plasmapheresis -iron and ferritin WNL  Acute encephalopathy -patient word finding and having a nonsensical conversation -CT head upon admission showed no acute intracranial abnormality -Given acute changes, will obtain MRI brain and continue to monitor closely  -Patient noted to have B12  deficiency- started on supplementation IM  Leukopenia -Resolved  Multiple myeloma -Patient has been on Revlimid and follows with Dr. Benay Spice -S/p bone marrow transplant in 2010 -Oncology consulted and appreciated -?Myelodysplastic syndrome -24 hour urine collection, kappa/lambda, MM panel- pending  -may need bone marrow biopsy  Elevated troponin -Currently chest pain free -Suspect secondary to severe anemia/demand ischemia -Troponin peaked at 0.86, continue to monitor  -Cannot heparinize given severe thrombocytopenia -Cardiology consulted and appreciated -hemoglobin A1c, lipid panel pending -No aspirin at this time given thrombocytopenia -Continue statin  Acute kidney injury -Suspect prerenal given drop in hemoglobin -baseline creatinine 1, upon admission 1.33 -Currently 1.20 -Continue to monitor BMP  Generalized weakness -Likely related to the above -Continue to monitor   Vitamin B 12 deficincy -B12 140 -Continue IM supplementation  Hypokalemia/Hypomagnesemia -Continue to monitor and replace as needed  Accelerated hypertension -BP improved -Continue amlodipine -continue to monitor closely   DVT Prophylaxis  SCDs  Code Status: Full  Family Communication: none at bedside  Disposition Plan: Admitted. Dispo TBD  Consultants Oncology Interventional radiology Cardiology  Procedures  Placement of non-tunneled HD catheter by IR Plasmapheresis   Antibiotics   Anti-infectives    None      Subjective:   [name] seen and examined today.  Patients she is feeling better. She has no complaints  Objective:   Vitals:   08/04/16 1817 08/04/16 2044 08/05/16 0353 08/05/16 0408  BP: 136/90 122/79  (!) 122/59  Pulse: 85   96  Resp:  19    Temp:  98.8 F (37.1 C)  97.6 F (36.4 C)  TempSrc:    Oral  SpO2:  97%  99%  Weight:   89.7 kg (197 lb 12.8 oz)   Height:        Intake/Output Summary (Last 24 hours) at  08/05/16 1045 Last data filed at  08/05/16 3552  Gross per 24 hour  Intake                0 ml  Output                0 ml  Net                0 ml   Filed Weights   08/03/16 2138 08/03/16 2344 08/05/16 0353  Weight: 86.2 kg (190 lb) 87.7 kg (193 lb 6.4 oz) 89.7 kg (197 lb 12.8 oz)   Exam  General: Well developed, well nourished, NAD, appears stated age  HEENT: NCAT, mucous membranes moist.   Cardiovascular: S1 S2 auscultated, no rubs, murmurs or gallops. Regular rate and rhythm.  Respiratory: Clear to auscultation bilaterally with equal chest rise  Abdomen: Soft, obese, nontender, nondistended, + bowel sounds  Extremities: warm dry without cyanosis clubbing or edema   Neuro: Appears to be confused, able to speak clearly, but makes no sense. Awake/alert/oriented. Nonfocal  Data Reviewed: I have personally reviewed following labs and imaging studies  CBC:  Recent Labs Lab 08/03/16 1932 08/03/16 2138 08/04/16 0843 08/05/16 0323  WBC 4.0 3.9* 3.8* 4.1  NEUTROABS 1.8 1.7 2.2 2.5  HGB 8.0* 9.4* 7.1* 7.4*  HCT 24.5* 27.9* 22.4* 23.3*  MCV 87.8 87.7 90.3 91.0  PLT 7* 8* 43* 30*   Basic Metabolic Panel:  Recent Labs Lab 08/03/16 1932 08/04/16 0843 08/05/16 0323  NA 139 137 141  K 3.0* 3.2* 3.5  CL 106 105 105  CO2 _0 GLUCOSE 107* 125* 143*  BUN _1 CREATININE 1.33* 1.16* 1.20*  CALCIUM 8.4* 7.9* 7.9*  MG 1.4*  --   --    GFR: Estimated Creatinine Clearance: 54.4 mL/min (A) (by C-G formula based on SCr of 1.2 mg/dL (H)). Liver Function Tests:  Recent Labs Lab 08/03/16 1932 08/04/16 0843 08/05/16 0323  AST _2 ALT _3 ALKPHOS 67 62 55  BILITOT 3.2* 2.8* 1.3*  PROT 8.0 7.0 6.3*  ALBUMIN 3.3* 2.6* 2.9*   No results for input(s): LIPASE, AMYLASE in the last 168 hours. No results for input(s): AMMONIA in the last 168 hours. Coagulation Profile:  Recent Labs Lab 08/03/16 2138 08/04/16 1123  INR 1.10 1.12   Cardiac Enzymes:  Recent Labs Lab  08/03/16 1932 08/03/16 2138  TROPONINI 0.60* 0.86*   BNP (last 3 results) No results for input(s): PROBNP in the last 8760 hours. HbA1C: No results for input(s): HGBA1C in the last 72 hours. CBG:  Recent Labs Lab 08/03/16 1852  GLUCAP 102*   Lipid Profile: No results for input(s): CHOL, HDL, LDLCALC, TRIG, CHOLHDL, LDLDIRECT in the last 72 hours. Thyroid Function Tests: No results for input(s): TSH, T4TOTAL, FREET4, T3FREE, THYROIDAB in the last 72 hours. Anemia Panel:  Recent Labs  08/04/16 1123 08/05/16 0323  VITAMINB12 140*  --   FERRITIN 585*  --   TIBC 246*  --   IRON 43  --   RETICCTPCT 4.7* 6.3*   Urine analysis:    Component Value Date/Time   COLORURINE YELLOW 08/03/2016 1839   APPEARANCEUR HAZY (A) 08/03/2016 1839   LABSPEC 1.022 08/03/2016 1839   LABSPEC 1.020 11/03/2009 1152   PHURINE 5.0 08/03/2016 1839   GLUCOSEU NEGATIVE 08/03/2016 1839   HGBUR LARGE (A) 08/03/2016 1839   BILIRUBINUR NEGATIVE 08/03/2016 1839   BILIRUBINUR Negative 11/03/2009 1152  KETONESUR NEGATIVE 08/03/2016 1839   PROTEINUR >=300 (A) 08/03/2016 1839   UROBILINOGEN 0.2 11/05/2009 1430   NITRITE NEGATIVE 08/03/2016 1839   LEUKOCYTESUR TRACE (A) 08/03/2016 1839   LEUKOCYTESUR Trace 11/03/2009 1152   Sepsis Labs: _0 (procalcitonin:4,lacticidven:4)  ) Recent Results (from the past 240 hour(s))  Urine culture     Status: Abnormal   Collection Time: 08/03/16  6:50 PM  Result Value Ref Range Status   Specimen Description URINE, CLEAN CATCH  Final   Special Requests NONE  Final   Culture MULTIPLE SPECIES PRESENT, SUGGEST RECOLLECTION (A)  Final   Report Status 08/05/2016 FINAL  Final      Radiology Studies: Dg Chest 2 View  Result Date: 08/03/2016 CLINICAL DATA:  Weakness and fatigue x4 days. No dyspnea or chest pain. EXAM: CHEST  2 VIEW COMPARISON:  06/21/2016 FINDINGS: The heart size and mediastinal contours are within normal limits. Both lungs are clear. Chronic  stable moderate compressions of T8 through T12 with kyphoplasties at T10 and T12. Mild superior endplate compression of L1, osteoporotic compression of L2 and L3 and superior endplate compression of the visualized L4 vertebral body appear stable. IMPRESSION: No active cardiopulmonary disease. Chronic stable thoracolumbar compression fractures. Electronically Signed   By: Ashley Royalty M.D.   On: 08/03/2016 19:32   Ct Head Wo Contrast  Result Date: 08/03/2016 CLINICAL DATA:  Generalized weakness EXAM: CT HEAD WITHOUT CONTRAST TECHNIQUE: Contiguous axial images were obtained from the base of the skull through the vertex without intravenous contrast. COMPARISON:  None. FINDINGS: Brain: No evidence of acute infarction, hemorrhage, hydrocephalus, extra-axial collection or mass lesion/mass effect. Vascular: No hyperdense vessel or unexpected calcification. Skull: Normal. Negative for fracture or focal lesion. Sinuses/Orbits: No acute finding. Other: None. IMPRESSION: No acute intracranial abnormality is noted. Electronically Signed   By: Inez Catalina M.D.   On: 08/03/2016 20:18   Ir Fluoro Guide Cv Line Right  Result Date: 08/04/2016 INDICATION: History of multiple myeloma, now with severe thrombocytopenia, worrisome for TTP. Please perform temporary dialysis catheter placement for the initiation of plasmapheresis. EXAM: NON-TUNNELED CENTRAL VENOUS HEMODIALYSIS CATHETER PLACEMENT WITH ULTRASOUND AND FLUOROSCOPIC GUIDANCE COMPARISON:  None. MEDICATIONS: None FLUOROSCOPY TIME:  54 seconds (6 mGy) COMPLICATIONS: None immediate. PROCEDURE: Informed written consent was obtained from the patient after a discussion of the risks, benefits, and alternatives to treatment. Questions regarding the procedure were encouraged and answered. The right neck and chest were prepped with chlorhexidine in a sterile fashion, and a sterile drape was applied covering the operative field. Maximum barrier sterile technique with sterile gowns  and gloves were used for the procedure. A timeout was performed prior to the initiation of the procedure. After creating a small venotomy incision, a micropuncture kit was utilized to access the right internal jugular vein under direct, real-time ultrasound guidance after the overlying soft tissues were anesthetized with 1% lidocaine with epinephrine. Ultrasound image documentation was performed. The microwire was kinked to measure appropriate catheter length. A stiff glidewire was advanced to the level of the IVC. Under fluoroscopic guidance, the venotomy was serially dilated, ultimately allowing placement of a 20 cm temporary [place]kur catheter with tip ultimately terminating within the superior aspect of the right atrium. Final catheter positioning was confirmed and documented with a spot radiographic image. The catheter aspirates and flushes normally. The catheter was flushed with appropriate volume heparin dwells. The catheter exit site was secured with a 0-Prolene retention suture. A dressing was placed. The patient tolerated the procedure well without immediate post  procedural complication. IMPRESSION: Successful placement of a right internal jugular approach 20 cm temporary dialysis catheter with tip terminating with in the superior aspect of the right atrium. The catheter is ready for immediate use. PLAN: This catheter may be converted to a tunneled dialysis catheter at a later date as indicated. Electronically Signed   By: Sandi Mariscal M.D.   On: 08/04/2016 12:51   Ir US Guide Vasc Access Right  Result Date: 08/04/2016 INDICATION: History of multiple myeloma, now with severe thrombocytopenia, worrisome for TTP. Please perform temporary dialysis catheter placement for the initiation of plasmapheresis. EXAM: NON-TUNNELED CENTRAL VENOUS HEMODIALYSIS CATHETER PLACEMENT WITH ULTRASOUND AND FLUOROSCOPIC GUIDANCE COMPARISON:  None. MEDICATIONS: None FLUOROSCOPY TIME:  54 seconds (6 mGy) COMPLICATIONS: None  immediate. PROCEDURE: Informed written consent was obtained from the patient after a discussion of the risks, benefits, and alternatives to treatment. Questions regarding the procedure were encouraged and answered. The right neck and chest were prepped with chlorhexidine in a sterile fashion, and a sterile drape was applied covering the operative field. Maximum barrier sterile technique with sterile gowns and gloves were used for the procedure. A timeout was performed prior to the initiation of the procedure. After creating a small venotomy incision, a micropuncture kit was utilized to access the right internal jugular vein under direct, real-time ultrasound guidance after the overlying soft tissues were anesthetized with 1% lidocaine with epinephrine. Ultrasound image documentation was performed. The microwire was kinked to measure appropriate catheter length. A stiff glidewire was advanced to the level of the IVC. Under fluoroscopic guidance, the venotomy was serially dilated, ultimately allowing placement of a 20 cm temporary [place]kur catheter with tip ultimately terminating within the superior aspect of the right atrium. Final catheter positioning was confirmed and documented with a spot radiographic image. The catheter aspirates and flushes normally. The catheter was flushed with appropriate volume heparin dwells. The catheter exit site was secured with a 0-Prolene retention suture. A dressing was placed. The patient tolerated the procedure well without immediate post procedural complication. IMPRESSION: Successful placement of a right internal jugular approach 20 cm temporary dialysis catheter with tip terminating with in the superior aspect of the right atrium. The catheter is ready for immediate use. PLAN: This catheter may be converted to a tunneled dialysis catheter at a later date as indicated. Electronically Signed   By: Sandi Mariscal M.D.   On: 08/04/2016 12:51     Scheduled Meds: . amLODipine  10 mg  Oral Daily  . atorvastatin  40 mg Oral q1800  . carvedilol  6.25 mg Oral BID WC  . cyanocobalamin  1,000 mcg Intramuscular Daily  . folic acid  1 mg Oral Daily  . hydrALAZINE  10 mg Intravenous Once   Continuous Infusions: . sodium chloride 125 mL/hr (08/03/16 2244)  . sodium chloride    . sodium chloride    . sodium chloride    . calcium gluconate IVPB    . citrate dextrose       LOS: 2 days   Time Spent in minutes   45 minutes  Mylen Mangan D.O. on 08/05/2016 at 10:45 AM  Between 7am to 7pm - Pager - [phone]  After 7pm go to [email or web address] - password [email or web address]  And look for the night coverage person covering for me after hours  [facility] Office  [phone]

## 2016-08-05 NOTE — Progress Notes (Signed)
Carrie Huber   DOB:Jul 26, 1950   GM#:[id number]    Subjective: She denies pain. She is noted to have altered mental status with confusion. The patient is confabulating. She tolerated plasmapheresis yesterday well. She did not report any neurological deficit, chest pain or shortness of breath The patient denies any recent signs or symptoms of bleeding such as spontaneous epistaxis, hematuria or hematochezia. She has no bowel movement since admission 24 hour urine collection in progress. Echocardiogram is pending.  Objective:  Vitals:   08/04/16 2044 08/05/16 0408  BP: 122/79 (!) 122/59  Pulse:  96  Resp: 19   Temp: 98.8 F (37.1 C) 97.6 F (36.4 C)     Intake/Output Summary (Last 24 hours) at 08/05/16 1108 Last data filed at 08/05/16 6440  Gross per 24 hour  Intake                0 ml  Output                0 ml  Net                0 ml    GENERAL:alert, no distress and comfortable SKIN: skin color, texture, turgor are normal, no rashes or significant lesions EYES: normal, Conjunctiva are pink and non-injected, sclera clear OROPHARYNX:no exudate, no erythema and lips, buccal mucosa, and tongue normal  NECK: supple, thyroid normal size, non-tender, without nodularity LYMPH:  no palpable lymphadenopathy in the cervical, axillary or inguinal LUNGS: clear to auscultation and percussion with normal breathing effort HEART: regular rate & rhythm and no murmurs and no lower extremity edema ABDOMEN:abdomen soft, non-tender and normal bowel sounds Musculoskeletal:no cyanosis of digits and no clubbing  NEURO: alert But not fully oriented with fluent speech, no focal motor/sensory deficits. There is mild confabulation. She thought currently is year 2020   Labs:  Lab Results  Component Value Date   WBC 4.1 08/05/2016   HGB 7.4 (L) 08/05/2016   HCT 23.3 (L) 08/05/2016   MCV 91.0 08/05/2016   PLT 30 (L) 08/05/2016   NEUTROABS 2.5 08/05/2016    Lab Results  Component Value Date   NA  141 08/05/2016   K 3.5 08/05/2016   CL 105 08/05/2016   CO2 25 08/05/2016    Studies:  Dg Chest 2 View  Result Date: 08/03/2016 CLINICAL DATA:  Weakness and fatigue x4 days. No dyspnea or chest pain. EXAM: CHEST  2 VIEW COMPARISON:  06/21/2016 FINDINGS: The heart size and mediastinal contours are within normal limits. Both lungs are clear. Chronic stable moderate compressions of T8 through T12 with kyphoplasties at T10 and T12. Mild superior endplate compression of L1, osteoporotic compression of L2 and L3 and superior endplate compression of the visualized L4 vertebral body appear stable. IMPRESSION: No active cardiopulmonary disease. Chronic stable thoracolumbar compression fractures. Electronically Signed   By: Ashley Royalty M.D.   On: 08/03/2016 19:32   Ct Head Wo Contrast  Result Date: 08/03/2016 CLINICAL DATA:  Generalized weakness EXAM: CT HEAD WITHOUT CONTRAST TECHNIQUE: Contiguous axial images were obtained from the base of the skull through the vertex without intravenous contrast. COMPARISON:  None. FINDINGS: Brain: No evidence of acute infarction, hemorrhage, hydrocephalus, extra-axial collection or mass lesion/mass effect. Vascular: No hyperdense vessel or unexpected calcification. Skull: Normal. Negative for fracture or focal lesion. Sinuses/Orbits: No acute finding. Other: None. IMPRESSION: No acute intracranial abnormality is noted. Electronically Signed   By: Inez Catalina M.D.   On: 08/03/2016 20:18   Ir  Fluoro Guide Cv Line Right  Result Date: 08/04/2016 INDICATION: History of multiple myeloma, now with severe thrombocytopenia, worrisome for TTP. Please perform temporary dialysis catheter placement for the initiation of plasmapheresis. EXAM: NON-TUNNELED CENTRAL VENOUS HEMODIALYSIS CATHETER PLACEMENT WITH ULTRASOUND AND FLUOROSCOPIC GUIDANCE COMPARISON:  None. MEDICATIONS: None FLUOROSCOPY TIME:  54 seconds (6 mGy) COMPLICATIONS: None immediate. PROCEDURE: Informed written consent was  obtained from the patient after a discussion of the risks, benefits, and alternatives to treatment. Questions regarding the procedure were encouraged and answered. The right neck and chest were prepped with chlorhexidine in a sterile fashion, and a sterile drape was applied covering the operative field. Maximum barrier sterile technique with sterile gowns and gloves were used for the procedure. A timeout was performed prior to the initiation of the procedure. After creating a small venotomy incision, a micropuncture kit was utilized to access the right internal jugular vein under direct, real-time ultrasound guidance after the overlying soft tissues were anesthetized with 1% lidocaine with epinephrine. Ultrasound image documentation was performed. The microwire was kinked to measure appropriate catheter length. A stiff glidewire was advanced to the level of the IVC. Under fluoroscopic guidance, the venotomy was serially dilated, ultimately allowing placement of a 20 cm temporary Mahurkur catheter with tip ultimately terminating within the superior aspect of the right atrium. Final catheter positioning was confirmed and documented with a spot radiographic image. The catheter aspirates and flushes normally. The catheter was flushed with appropriate volume heparin dwells. The catheter exit site was secured with a 0-Prolene retention suture. A dressing was placed. The patient tolerated the procedure well without immediate post procedural complication. IMPRESSION: Successful placement of a right internal jugular approach 20 cm temporary dialysis catheter with tip terminating with in the superior aspect of the right atrium. The catheter is ready for immediate use. PLAN: This catheter may be converted to a tunneled dialysis catheter at a later date as indicated. Electronically Signed   By: Sandi Mariscal M.D.   On: 08/04/2016 12:51   Ir US Guide Vasc Access Right  Result Date: 08/04/2016 INDICATION: History of multiple  myeloma, now with severe thrombocytopenia, worrisome for TTP. Please perform temporary dialysis catheter placement for the initiation of plasmapheresis. EXAM: NON-TUNNELED CENTRAL VENOUS HEMODIALYSIS CATHETER PLACEMENT WITH ULTRASOUND AND FLUOROSCOPIC GUIDANCE COMPARISON:  None. MEDICATIONS: None FLUOROSCOPY TIME:  54 seconds (6 mGy) COMPLICATIONS: None immediate. PROCEDURE: Informed written consent was obtained from the patient after a discussion of the risks, benefits, and alternatives to treatment. Questions regarding the procedure were encouraged and answered. The right neck and chest were prepped with chlorhexidine in a sterile fashion, and a sterile drape was applied covering the operative field. Maximum barrier sterile technique with sterile gowns and gloves were used for the procedure. A timeout was performed prior to the initiation of the procedure. After creating a small venotomy incision, a micropuncture kit was utilized to access the right internal jugular vein under direct, real-time ultrasound guidance after the overlying soft tissues were anesthetized with 1% lidocaine with epinephrine. Ultrasound image documentation was performed. The microwire was kinked to measure appropriate catheter length. A stiff glidewire was advanced to the level of the IVC. Under fluoroscopic guidance, the venotomy was serially dilated, ultimately allowing placement of a 20 cm temporary Mahurkur catheter with tip ultimately terminating within the superior aspect of the right atrium. Final catheter positioning was confirmed and documented with a spot radiographic image. The catheter aspirates and flushes normally. The catheter was flushed with appropriate volume heparin dwells. The  catheter exit site was secured with a 0-Prolene retention suture. A dressing was placed. The patient tolerated the procedure well without immediate post procedural complication. IMPRESSION: Successful placement of a right internal jugular approach  20 cm temporary dialysis catheter with tip terminating with in the superior aspect of the right atrium. The catheter is ready for immediate use. PLAN: This catheter may be converted to a tunneled dialysis catheter at a later date as indicated. Electronically Signed   By: Sandi Mariscal M.D.   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 The elevated troponin could be a signs of ischemia secondary to thrombosis She had central line placement and daily plasmapheresis started on 08/04/2016 We'll follow labs daily basis  Multiple myeloma Occasionally, progressive disease can cause similar picture The patient had received bone marrow transplant many years ago and had been on maintenance Revlimid Newly diagnosed myelodysplastic syndrome can also cause similar picture I will restage myeloma with 24-hour urine collection along with repeat blood work It is likely she may need repeat bone marrow biopsy as well Revlimid is placed on hold for now  Elevated troponin Echocardiogram is pending Continue supportive care  Severe vitamin B-12 deficiency It could explain confabulation and pancytopenia She started on high-dose vitamin B-12 injection and folic acid supplement  Altered mental status Could be due to TTP I recommend MRI with contrast for further evaluation I recommend blood transfusion in  case this is due to ischemia from severe anemia  We discussed some of the risks, benefits, and alternatives of blood transfusions. The patient is symptomatic from anemia and the hemoglobin level is critically low.  Some of the side-effects to be expected including risks of transfusion reactions, chills, infection, syndrome of volume overload and risk of hospitalization from various reasons and the patient is willing to proceed and went ahead to sign consent today. I will give her 1 unit of blood after plasmapheresis is completed   Discharge planning Not ready for discharge I discussed the plans of care with patient, her husband and her son who is a radiologist Dr. Learta Codding will resume care next week   Heath Lark, MD 08/05/2016  11:08 AM

## 2016-08-05 NOTE — Progress Notes (Signed)
  Echocardiogram 2D Echocardiogram has been performed.  Carrie Huber 08/05/2016, 9:25 AM

## 2016-08-06 ENCOUNTER — Inpatient Hospital Stay (HOSPITAL_COMMUNITY): Payer: BC Managed Care – PPO

## 2016-08-06 DIAGNOSIS — D701 Agranulocytosis secondary to cancer chemotherapy: Secondary | ICD-10-CM

## 2016-08-06 LAB — THERAPEUTIC PLASMA EXCHANGE (BLOOD BANK)
Plasma Exchange: 3328
Plasma volume needed: 3154
UNIT DIVISION: 0
UNIT DIVISION: 0
UNIT DIVISION: 0
UNIT DIVISION: 0
UNIT DIVISION: 0
UNIT DIVISION: 0
UNIT DIVISION: 0
Unit division: 0
Unit division: 0
Unit division: 0
Unit division: 0

## 2016-08-06 LAB — CBC WITH DIFFERENTIAL/PLATELET
Basophils Absolute: 0 K/uL (ref 0.0–0.1)
Basophils Relative: 0 %
Eosinophils Absolute: 0.1 K/uL (ref 0.0–0.7)
Eosinophils Relative: 1 %
HCT: 31.5 % — ABNORMAL LOW (ref 36.0–46.0)
Hemoglobin: 10 g/dL — ABNORMAL LOW (ref 12.0–15.0)
Lymphocytes Relative: 40 %
Lymphs Abs: 2.7 K/uL (ref 0.7–4.0)
MCH: 28 pg (ref 26.0–34.0)
MCHC: 31.7 g/dL (ref 30.0–36.0)
MCV: 88.2 fL (ref 78.0–100.0)
Monocytes Absolute: 0.6 K/uL (ref 0.1–1.0)
Monocytes Relative: 9 %
Neutro Abs: 3.3 K/uL (ref 1.7–7.7)
Neutrophils Relative %: 50 %
Platelets: 64 K/uL — ABNORMAL LOW (ref 150–400)
RBC: 3.57 MIL/uL — ABNORMAL LOW (ref 3.87–5.11)
RDW: 20.8 % — ABNORMAL HIGH (ref 11.5–15.5)
WBC: 6.6 K/uL (ref 4.0–10.5)

## 2016-08-06 LAB — COMPREHENSIVE METABOLIC PANEL
ALT: 20 U/L (ref 14–54)
AST: 20 U/L (ref 15–41)
Albumin: 3.4 g/dL — ABNORMAL LOW (ref 3.5–5.0)
Alkaline Phosphatase: 53 U/L (ref 38–126)
Anion gap: 6 (ref 5–15)
BUN: 15 mg/dL (ref 6–20)
CALCIUM: 8.4 mg/dL — AB (ref 8.9–10.3)
CHLORIDE: 105 mmol/L (ref 101–111)
CO2: 31 mmol/L (ref 22–32)
CREATININE: 1.13 mg/dL — AB (ref 0.44–1.00)
GFR calc Af Amer: 58 mL/min — ABNORMAL LOW (ref >60)
GFR calc non Af Amer: 50 mL/min — ABNORMAL LOW (ref >60)
Glucose, Bld: 109 mg/dL — ABNORMAL HIGH (ref 65–99)
Potassium: 3.1 mmol/L — ABNORMAL LOW (ref 3.5–5.1)
SODIUM: 142 mmol/L (ref 135–145)
Total Bilirubin: 2.3 mg/dL — ABNORMAL HIGH (ref 0.3–1.2)
Total Protein: 6.1 g/dL — ABNORMAL LOW (ref 6.5–8.1)

## 2016-08-06 LAB — RETICULOCYTES
RBC.: 3.57 MIL/uL — ABNORMAL LOW (ref 3.87–5.11)
Retic Count, Absolute: 157.1 10*3/uL (ref 19.0–186.0)
Retic Ct Pct: 4.4 % — ABNORMAL HIGH (ref 0.4–3.1)

## 2016-08-06 LAB — PROTEIN, URINE, 24 HOUR
COLLECTION INTERVAL-UPROT: 24 h
Protein, 24H Urine: 2124 mg/d — ABNORMAL HIGH (ref 50–100)
Protein, Urine: 118 mg/dL
URINE TOTAL VOLUME-UPROT: 1800 mL

## 2016-08-06 LAB — POCT I-STAT, CHEM 8
BUN: 15 mg/dL (ref 6–20)
BUN: 18 mg/dL (ref 6–20)
CHLORIDE: 103 mmol/L (ref 101–111)
Calcium, Ion: 1.1 mmol/L — ABNORMAL LOW (ref 1.15–1.40)
Calcium, Ion: 1.02 mmol/L — ABNORMAL LOW (ref 1.15–1.40)
Chloride: 105 mmol/L (ref 101–111)
Creatinine, Ser: 1.1 mg/dL — ABNORMAL HIGH (ref 0.44–1.00)
Creatinine, Ser: 1.1 mg/dL — ABNORMAL HIGH (ref 0.44–1.00)
GLUCOSE: 104 mg/dL — AB (ref 65–99)
Glucose, Bld: 95 mg/dL (ref 65–99)
HCT: 53 % — ABNORMAL HIGH (ref 36.0–46.0)
HEMATOCRIT: 32 % — AB (ref 36.0–46.0)
Hemoglobin: 18 g/dL — ABNORMAL HIGH (ref 12.0–15.0)
Hemoglobin: 10.9 g/dL — ABNORMAL LOW (ref 12.0–15.0)
POTASSIUM: 3.8 mmol/L (ref 3.5–5.1)
Potassium: 3.5 mmol/L (ref 3.5–5.1)
Sodium: 142 mmol/L (ref 135–145)
Sodium: 144 mmol/L (ref 135–145)
TCO2: 25 mmol/L (ref 0–100)
TCO2: 28 mmol/L (ref 0–100)

## 2016-08-06 LAB — KAPPA/LAMBDA LIGHT CHAINS
KAPPA FREE LGHT CHN: 119.8 mg/L — AB (ref 3.3–19.4)
Kappa, lambda light chain ratio: 1.27 (ref 0.26–1.65)
Lambda free light chains: 94 mg/L — ABNORMAL HIGH (ref 5.7–26.3)

## 2016-08-06 LAB — LACTATE DEHYDROGENASE: LDH: 234 U/L — ABNORMAL HIGH (ref 98–192)

## 2016-08-06 LAB — TROPONIN I: Troponin I: 0.43 ng/mL

## 2016-08-06 LAB — PATHOLOGIST SMEAR REVIEW

## 2016-08-06 LAB — MAGNESIUM: MAGNESIUM: 1.8 mg/dL (ref 1.7–2.4)

## 2016-08-06 MED ORDER — ACETAMINOPHEN 325 MG PO TABS: 650.0000 mg | ORAL_TABLET | ORAL | Status: DC | PRN

## 2016-08-06 MED ORDER — ACD FORMULA A 0.73-2.45-2.2 GM/100ML VI SOLN: 500.0000 mL | Status: DC

## 2016-08-06 MED ORDER — ANTICOAGULANT SODIUM CITRATE 4% (200MG/5ML) IV SOLN
5.0000 mL | Freq: Once | Status: AC
  Administered 2016-08-06: 5 mL
  Filled 2016-08-06: qty 250

## 2016-08-06 MED ORDER — ACD FORMULA A 0.73-2.45-2.2 GM/100ML VI SOLN
500.0000 mL | Status: DC
  Filled 2016-08-06 (×2): qty 500

## 2016-08-06 MED ORDER — SODIUM CHLORIDE 0.9 % IV SOLN
2.0000 g | Freq: Once | INTRAVENOUS | Status: AC
  Administered 2016-08-06: 2 g via INTRAVENOUS
  Filled 2016-08-06 (×2): qty 20

## 2016-08-06 MED ORDER — DIPHENHYDRAMINE HCL 25 MG PO CAPS: 25.0000 mg | ORAL_CAPSULE | Freq: Four times a day (QID) | ORAL | Status: DC | PRN

## 2016-08-06 MED ORDER — POTASSIUM CHLORIDE CRYS ER 20 MEQ PO TBCR
40.0000 meq | EXTENDED_RELEASE_TABLET | Freq: Once | ORAL | Status: AC
  Administered 2016-08-06: 40 meq via ORAL
  Filled 2016-08-06: qty 2

## 2016-08-06 MED ORDER — SODIUM CHLORIDE 0.9 % IV SOLN: 2.0000 g | Freq: Once | INTRAVENOUS | Status: DC

## 2016-08-06 MED ORDER — CALCIUM CARBONATE ANTACID 500 MG PO CHEW: 2.0000 | CHEWABLE_TABLET | ORAL | Status: DC

## 2016-08-06 MED ORDER — ACD FORMULA A 0.73-2.45-2.2 GM/100ML VI SOLN
Status: AC
  Administered 2016-08-06: 500 mL via INTRAVENOUS
  Filled 2016-08-06: qty 500

## 2016-08-06 MED ORDER — ANTICOAGULANT SODIUM CITRATE 4% (200MG/5ML) IV SOLN: 5.0000 mL | Freq: Once | Status: DC

## 2016-08-06 MED ORDER — SODIUM CHLORIDE 0.9 % IV SOLN
2.0000 g | Freq: Once | INTRAVENOUS | Status: AC
  Filled 2016-08-06: qty 20

## 2016-08-06 MED ORDER — CALCIUM CARBONATE ANTACID 500 MG PO CHEW: 2.0000 | CHEWABLE_TABLET | ORAL | Status: AC

## 2016-08-06 MED ORDER — GI COCKTAIL ~~LOC~~: 30.0000 mL | Freq: Once | ORAL | Status: DC

## 2016-08-06 MED ORDER — ANTICOAGULANT SODIUM CITRATE 4% (200MG/5ML) IV SOLN
5.0000 mL | Freq: Once | Status: AC
  Administered 2016-08-06: 5 mL
  Filled 2016-08-06 (×3): qty 250

## 2016-08-06 NOTE — Progress Notes (Signed)
IP PROGRESS NOTE  Subjective:   Carrie Huber is well-known to me with a history of multiple myeloma. She has been in clinical remission from the myeloma since undergoing high-dose chemotherapy with autologous stem cell support in 2010. She is treated with maintenance Revlimid and has chronic neutropenia. She presented to the emergency room on 08/03/2016 with generalized weakness. She reports not feeling well for approximately 10 days. She denies fever and pain.  She was noted to have severe anemia/thrombocytopenia. Dr. Alvy Bimler was consulted and made a diagnosis of TTP. She was started on plasma exchange beginning 08/04/2016. She reports tolerating the plasma exchange well. She was transfused packed red blood cells and platelets over the weekend.  She reports feeling well this morning and would like to go home.   Objective: Vital signs in last 24 hours: Blood pressure (!) 117/99, pulse 64, temperature 98.1 F (36.7 C), resp. rate 18, height '5\' 7"'  (1.702 m), weight 197 lb (89.4 kg), SpO2 99 %.  Intake/Output from previous day: 06/03 0701 - 06/04 0700 In: 2197.1 [P.O.:990; I.V.:20; Blood:687.1; IV Piggyback:500] Out: 1700 [Urine:1700]  Physical Exam:  HEENT: No thrush or bleeding Lungs: Clear bilaterally Cardiac: Regular rate and rhythm Abdomen: No hepatosplenomegaly Extremities: No leg edema Neurologic: Alert, follows commands, oriented to place and situation. Difficulty with memory recall and word finding. The face is symmetric. The motor exam appears intact in the upper and lower extremities Skin: No ecchymoses or petechiae  Right neck apheresis catheter with a gauze dressing  Lab Results:  Recent Labs  08/05/16 0323 08/06/16 0427  WBC 4.1 6.6  HGB 7.4* 10.0*  HCT 23.3* 31.5*  PLT 30* 64*  Review of peripheral blood smear from 08/04/2016: The white cell morphology is unremarkable. No blasts. The platelets are decreased in number. No platelet clumps. There are numerous  schistocytes. Rare nucleated red cell.  BMET  Recent Labs  08/05/16 0323 08/06/16 0427  NA 141 142  K 3.5 3.1*  CL 105 105  CO2 25 31  GLUCOSE 143* 109*  BUN 15 15  CREATININE 1.20* 1.13*  CALCIUM 7.9* 8.4*    Studies/Results: Ir Fluoro Guide Cv Line Right  Result Date: 08/04/2016 INDICATION: History of multiple myeloma, now with severe thrombocytopenia, worrisome for TTP. Please perform temporary dialysis catheter placement for the initiation of plasmapheresis. EXAM: NON-TUNNELED CENTRAL VENOUS HEMODIALYSIS CATHETER PLACEMENT WITH ULTRASOUND AND FLUOROSCOPIC GUIDANCE COMPARISON:  None. MEDICATIONS: None FLUOROSCOPY TIME:  54 seconds (6 mGy) COMPLICATIONS: None immediate. PROCEDURE: Informed written consent was obtained from the patient after a discussion of the risks, benefits, and alternatives to treatment. Questions regarding the procedure were encouraged and answered. The right neck and chest were prepped with chlorhexidine in a sterile fashion, and a sterile drape was applied covering the operative field. Maximum barrier sterile technique with sterile gowns and gloves were used for the procedure. A timeout was performed prior to the initiation of the procedure. After creating a small venotomy incision, a micropuncture kit was utilized to access the right internal jugular vein under direct, real-time ultrasound guidance after the overlying soft tissues were anesthetized with 1% lidocaine with epinephrine. Ultrasound image documentation was performed. The microwire was kinked to measure appropriate catheter length. A stiff glidewire was advanced to the level of the IVC. Under fluoroscopic guidance, the venotomy was serially dilated, ultimately allowing placement of a 20 cm temporary Mahurkur catheter with tip ultimately terminating within the superior aspect of the right atrium. Final catheter positioning was confirmed and documented with a spot  radiographic image. The catheter aspirates and  flushes normally. The catheter was flushed with appropriate volume heparin dwells. The catheter exit site was secured with a 0-Prolene retention suture. A dressing was placed. The patient tolerated the procedure well without immediate post procedural complication. IMPRESSION: Successful placement of a right internal jugular approach 20 cm temporary dialysis catheter with tip terminating with in the superior aspect of the right atrium. The catheter is ready for immediate use. PLAN: This catheter may be converted to a tunneled dialysis catheter at a later date as indicated. Electronically Signed   By: Sandi Mariscal M.D.   On: 08/04/2016 12:51   Ir US Guide Vasc Access Right  Result Date: 08/04/2016 INDICATION: History of multiple myeloma, now with severe thrombocytopenia, worrisome for TTP. Please perform temporary dialysis catheter placement for the initiation of plasmapheresis. EXAM: NON-TUNNELED CENTRAL VENOUS HEMODIALYSIS CATHETER PLACEMENT WITH ULTRASOUND AND FLUOROSCOPIC GUIDANCE COMPARISON:  None. MEDICATIONS: None FLUOROSCOPY TIME:  54 seconds (6 mGy) COMPLICATIONS: None immediate. PROCEDURE: Informed written consent was obtained from the patient after a discussion of the risks, benefits, and alternatives to treatment. Questions regarding the procedure were encouraged and answered. The right neck and chest were prepped with chlorhexidine in a sterile fashion, and a sterile drape was applied covering the operative field. Maximum barrier sterile technique with sterile gowns and gloves were used for the procedure. A timeout was performed prior to the initiation of the procedure. After creating a small venotomy incision, a micropuncture kit was utilized to access the right internal jugular vein under direct, real-time ultrasound guidance after the overlying soft tissues were anesthetized with 1% lidocaine with epinephrine. Ultrasound image documentation was performed. The microwire was kinked to measure  appropriate catheter length. A stiff glidewire was advanced to the level of the IVC. Under fluoroscopic guidance, the venotomy was serially dilated, ultimately allowing placement of a 20 cm temporary Mahurkur catheter with tip ultimately terminating within the superior aspect of the right atrium. Final catheter positioning was confirmed and documented with a spot radiographic image. The catheter aspirates and flushes normally. The catheter was flushed with appropriate volume heparin dwells. The catheter exit site was secured with a 0-Prolene retention suture. A dressing was placed. The patient tolerated the procedure well without immediate post procedural complication. IMPRESSION: Successful placement of a right internal jugular approach 20 cm temporary dialysis catheter with tip terminating with in the superior aspect of the right atrium. The catheter is ready for immediate use. PLAN: This catheter may be converted to a tunneled dialysis catheter at a later date as indicated. Electronically Signed   By: Sandi Mariscal M.D.   On: 08/04/2016 12:51    Medications: I have reviewed the patient's current medications.  Assessment/Plan: 1. Multiple myeloma, IgG kappa, status post 7 cycles of Revlimid/Decadron and Velcade with marked clinical and laboratory improvement. She is status post melphalan conditioning, followed by an autologous stem cell infusion 09/10/2008. She began maintenance Revlimid 01/04/2009. She has persistent mild elevation of the kappa and lambda light chains 2. Neutropenia while on Revlimid at a dose of 10 mg daily. The Revlimid dose was reduced to 5 mg in September 2011 due to persistent neutropenia and a hospital admission with pneumonia/sepsis. neutropenia persists. 3. Admission 11/03/2009 with left lung pneumonia and sepsis syndrome. No specific organism was cultured during the hospital admission. She completed outpatient Levaquin therapy.  4. History of anemia secondary to multiple  myeloma, status post a red cell transfusion in September 2009.  5. Thoracic spine compression  fracture, status post a kyphoplasty 12/05/2007. A metastatic bone survey was unchanged on 06/21/2016. 6. Tiny pulmonary embolism on a CT 11/20/2007, now maintained off of Coumadin.  7. Chronic tachycardia on repeat office visits at the [facility].  8. History of hypertension. 9. Admission 08/03/2016 with severe anemia/thrombocytopenia-clinical presentation and laboratory studies consistent with a diagnosis of TTP  Daily plasmapheresis initiated 08/04/2016  10. Altered mental status-most likely secondary to TTP   Carrie Huber has a history of multiple myeloma. She was on maintenance Revlimid prior to hospital admission. The clinical presentation is most consistent with a diagnosis of TTP. I doubt her current presentation is related to progression of myeloma or toxicity from Revlimid. I agree with the plan for daily plasma exchange.  Recommendations: 1. Continue daily plasma exchange with follow-up of the CBC, chemistry panel, and LDH 2. Follow-up restaging myeloma panel 3. Follow-up ADAMS13 activity from 08/04/2016 4. Follow for clearing of altered mental status with resolution of hemolysis, MRI brain if no improvement over the next few days  Hematology will continue following her daily.     LOS: 3 days   Donneta Romberg, MD   08/06/2016, 10:52 AM

## 2016-08-06 NOTE — Progress Notes (Signed)
Patient refused MRI. She states "I have the right to refuse".

## 2016-08-06 NOTE — Progress Notes (Signed)
PROGRESS NOTE    Carrie Huber  GYF:[id number] DOB: [birth year] DOA: 08/03/2016 PCP: Viann Fish, RN   Chief Complaint  Patient presents with  . Weakness    Brief Narrative:  HPI on 08/03/2016 by Dr Trixie Dredge is a 66 y.o. female with medical history significant of hypertension, multiple myeloma (s/p of autologous bone marrow transplantation 2010), PE 7 years ago, anemia, chronic kidney disease-stage II, who presents with generalized weakness. Patient states that she has been having generalized weakness in the past 4 days, which has been progressively getting worse. She does not have unilateral weakness, numbness or tingling in extremities. No facial droop or slurred speech. Denies symptoms of UTI. No nausea, vomiting, diarrhea or abdominal pain. Patient does not have chest pain or shortness of breath. No cough, fever or chills. Patient blood pressure elevated at SBP 200s initally, then 180/89.   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 Alvy Bimler. Blood smear reviewed revealing schistocytes. Tear drop cells and basophilic stippling also noted.   -Interventional radiology consulted and appreciated, s/p HD catheter placement -pending ADAMTS13, haptoglobin -patient started plasmapheresis on 08/04/2016, will continue per oncology  -iron and ferritin WNL  Acute encephalopathy -patient word finding and having a nonsensical conversation on 6/2 -CT head upon admission showed no acute intracranial abnormality -Given acute changes, MRI brain was ordered, however patient has refused. -Patient noted to have B12  deficiency- started on supplementation IM -?possibly secondary to the above, currently patient appears to be at baseline  Leukopenia -Resolved  Multiple myeloma -Patient has been on Revlimid and follows with Dr. Benay Spice -S/p bone marrow transplant in 2010 -Oncology consulted and appreciated -?Myelodysplastic syndrome -24 hour urine collection, kappa/lambda, MM panel- pending  -may need bone marrow biopsy  Elevated troponin -Currently chest pain free -Suspect secondary to severe anemia/demand ischemia -Troponin peaked at 0.86 -Cannot heparinize given severe thrombocytopenia -Cardiology consulted and appreciated -hemoglobin A1c, lipid panel pending (not drawn- not sure why, will reorder) -No aspirin at this time given thrombocytopenia -Continue statin  Acute kidney injury -Suspect prerenal given drop in hemoglobin -baseline creatinine 1, upon admission 1.33 -Creatinine improving, currently 1.13 -Continue to monitor BMP  Generalized weakness -Likely related to the above -Continue to monitor   Vitamin B 12 deficincy -B12 140 -Continue IM supplementation  Hypokalemia/Hypomagnesemia -Continue to monitor and replace as needed  Accelerated hypertension -BP improved -Continue amlodipine -continue to monitor closely   DVT Prophylaxis  SCDs  Code Status: Full  Family Communication: Husband at bedside  Disposition Plan: Admitted. Dispo TBD  Consultants Oncology Interventional radiology Cardiology  Procedures  Placement of non-tunneled HD catheter by IR Plasmapheresis   Antibiotics   Anti-infectives    None      Subjective:   Carrie Huber seen and examined today.  Patient states she is feeling better and now feels like a person. Denies chest pain, shortness of breath, abdominal pain, nausea, vomiting, diarrhea, constipation, headache, dizziness.  Does not want an MRI given her claustrophobia.   Objective:   Vitals:   08/05/16 2001 08/05/16 2039  08/05/16 2257 08/06/16 0539  BP: (!) 147/80  (!) 151/87 (!) 117/99  Pulse: 85 71 72 64  Resp: '19 18 20 ' 18  Temp: 98.6 F (37 C) 98.5 F (36.9 C) 97.6 F (36.4 C) 98.1 F (36.7 C)  TempSrc:      SpO2: 100% 98% 99% 99%  Weight:    89.4 kg (197 lb)  Height:        Intake/Output Summary (Last 24 hours) at 08/06/16 0951 Last data filed at 08/06/16 0538  Gross per 24 hour  Intake          1897.08 ml  Output             1700 ml  Net           197.08 ml   Filed Weights   08/05/16 0353 08/05/16 1500 08/06/16 0539  Weight: 89.7 kg (197 lb 12.8 oz) 87.1 kg (192 lb) 89.4 kg (197 lb)   Exam  General: Well developed, well nourished, no acute distress  HEENT: NCAT,  mucous membranes moist.   Cardiovascular: S1 S2 auscultated, RRR, no murmurs  Respiratory: Clear to auscultation bilaterally with equal chest rise  Abdomen: Soft, obese, nontender, nondistended, + bowel sounds  Extremities: warm dry without cyanosis clubbing or edema  Neuro: AAOx3, nonfocal  Psych: Appropriate mood and affect  Data Reviewed: I have personally reviewed following labs and imaging studies  CBC:  Recent Labs Lab 08/03/16 1932 08/03/16 2138 08/04/16 0843 08/05/16 0323 08/06/16 0427  WBC 4.0 3.9* 3.8* 4.1 6.6  NEUTROABS 1.8 1.7 2.2 2.5 3.3  HGB 8.0* 9.4* 7.1* 7.4* 10.0*  HCT 24.5* 27.9* 22.4* 23.3* 31.5*  MCV 87.8 87.7 90.3 91.0 88.2  PLT 7* 8* 43* 30* 64*   Basic Metabolic Panel:  Recent Labs Lab 08/03/16 1932 08/04/16 0843 08/05/16 0323 08/05/16 1108 08/06/16 0427  NA 139 137 141  --  142  K 3.0* 3.2* 3.5  --  3.1*  CL 106 105 105  --  105  CO2 '23 24 25  ' --  31  GLUCOSE 107* 125* 143*  --  109*  BUN '18 13 15  ' --  15  CREATININE 1.33* 1.16* 1.20*  --  1.13*  CALCIUM 8.4* 7.9* 7.9*  --  8.4*  MG 1.4*  --   --  2.0 1.8   GFR: Estimated Creatinine Clearance: 57.7 mL/min (A) (by C-G formula based on SCr of 1.13 mg/dL (H)). Liver Function Tests:  Recent Labs Lab 08/03/16 1932  08/04/16 0843 08/05/16 0323 08/06/16 0427  AST '26 25 24 20  ' ALT '14 15 22 20  ' ALKPHOS 67 62 55 53  BILITOT 3.2* 2.8* 1.3* 2.3*  PROT 8.0 7.0 6.3* 6.1*  ALBUMIN 3.3* 2.6* 2.9* 3.4*   No results for input(s): LIPASE, AMYLASE in the last 168 hours. No results for input(s): AMMONIA in the last 168 hours. Coagulation Profile:  Recent Labs Lab 08/03/16 2138 08/04/16 1123  INR 1.10 1.12   Cardiac Enzymes:  Recent Labs Lab 08/03/16 1932 08/03/16 2138  TROPONINI 0.60* 0.86*   BNP (last 3 results) No results for input(s): PROBNP in the last 8760 hours. HbA1C: No results for input(s): HGBA1C in the last 72 hours. CBG:  Recent Labs Lab 08/03/16 1852  GLUCAP 102*   Lipid Profile: No results for input(s): CHOL, HDL, LDLCALC, TRIG, CHOLHDL, LDLDIRECT in the last 72 hours. Thyroid Function Tests: No results for input(s): TSH, T4TOTAL, FREET4, T3FREE, THYROIDAB in the last 72 hours. Anemia Panel:  Recent Labs  08/04/16 1123 08/05/16 0323 08/06/16 0427  VITAMINB12 140*  --   --   FERRITIN 585*  --   --  TIBC 246*  --   --   IRON 43  --   --   RETICCTPCT 4.7* 6.3* 4.4*   Urine analysis:    Component Value Date/Time   COLORURINE YELLOW 08/03/2016 1839   APPEARANCEUR HAZY (A) 08/03/2016 1839   LABSPEC 1.022 08/03/2016 1839   LABSPEC 1.020 11/03/2009 1152   PHURINE 5.0 08/03/2016 1839   GLUCOSEU NEGATIVE 08/03/2016 1839   HGBUR LARGE (A) 08/03/2016 1839   BILIRUBINUR NEGATIVE 08/03/2016 1839   BILIRUBINUR Negative 11/03/2009 1152   KETONESUR NEGATIVE 08/03/2016 1839   PROTEINUR >=300 (A) 08/03/2016 1839   UROBILINOGEN 0.2 11/05/2009 1430   NITRITE NEGATIVE 08/03/2016 1839   LEUKOCYTESUR TRACE (A) 08/03/2016 1839   LEUKOCYTESUR Trace 11/03/2009 1152   Sepsis Labs: '@LABRCNTIP' (procalcitonin:4,lacticidven:4)  ) Recent Results (from the past 240 hour(s))  Urine culture     Status: Abnormal   Collection Time: 08/03/16  6:50 PM  Result Value Ref Range Status    Specimen Description URINE, CLEAN CATCH  Final   Special Requests NONE  Final   Culture MULTIPLE SPECIES PRESENT, SUGGEST RECOLLECTION (A)  Final   Report Status 08/05/2016 FINAL  Final      Radiology Studies: Ir Fluoro Guide Cv Line Right  Result Date: 08/04/2016 INDICATION: History of multiple myeloma, now with severe thrombocytopenia, worrisome for TTP. Please perform temporary dialysis catheter placement for the initiation of plasmapheresis. EXAM: NON-TUNNELED CENTRAL VENOUS HEMODIALYSIS CATHETER PLACEMENT WITH ULTRASOUND AND FLUOROSCOPIC GUIDANCE COMPARISON:  None. MEDICATIONS: None FLUOROSCOPY TIME:  54 seconds (6 mGy) COMPLICATIONS: None immediate. PROCEDURE: Informed written consent was obtained from the patient after a discussion of the risks, benefits, and alternatives to treatment. Questions regarding the procedure were encouraged and answered. The right neck and chest were prepped with chlorhexidine in a sterile fashion, and a sterile drape was applied covering the operative field. Maximum barrier sterile technique with sterile gowns and gloves were used for the procedure. A timeout was performed prior to the initiation of the procedure. After creating a small venotomy incision, a micropuncture kit was utilized to access the right internal jugular vein under direct, real-time ultrasound guidance after the overlying soft tissues were anesthetized with 1% lidocaine with epinephrine. Ultrasound image documentation was performed. The microwire was kinked to measure appropriate catheter length. A stiff glidewire was advanced to the level of the IVC. Under fluoroscopic guidance, the venotomy was serially dilated, ultimately allowing placement of a 20 cm temporary Mahurkur catheter with tip ultimately terminating within the superior aspect of the right atrium. Final catheter positioning was confirmed and documented with a spot radiographic image. The catheter aspirates and flushes normally. The  catheter was flushed with appropriate volume heparin dwells. The catheter exit site was secured with a 0-Prolene retention suture. A dressing was placed. The patient tolerated the procedure well without immediate post procedural complication. IMPRESSION: Successful placement of a right internal jugular approach 20 cm temporary dialysis catheter with tip terminating with in the superior aspect of the right atrium. The catheter is ready for immediate use. PLAN: This catheter may be converted to a tunneled dialysis catheter at a later date as indicated. Electronically Signed   By: Sandi Mariscal M.D.   On: 08/04/2016 12:51   Ir US Guide Vasc Access Right  Result Date: 08/04/2016 INDICATION: History of multiple myeloma, now with severe thrombocytopenia, worrisome for TTP. Please perform temporary dialysis catheter placement for the initiation of plasmapheresis. EXAM: NON-TUNNELED CENTRAL VENOUS HEMODIALYSIS CATHETER PLACEMENT WITH ULTRASOUND AND FLUOROSCOPIC GUIDANCE COMPARISON:  None.  MEDICATIONS: None FLUOROSCOPY TIME:  54 seconds (6 mGy) COMPLICATIONS: None immediate. PROCEDURE: Informed written consent was obtained from the patient after a discussion of the risks, benefits, and alternatives to treatment. Questions regarding the procedure were encouraged and answered. The right neck and chest were prepped with chlorhexidine in a sterile fashion, and a sterile drape was applied covering the operative field. Maximum barrier sterile technique with sterile gowns and gloves were used for the procedure. A timeout was performed prior to the initiation of the procedure. After creating a small venotomy incision, a micropuncture kit was utilized to access the right internal jugular vein under direct, real-time ultrasound guidance after the overlying soft tissues were anesthetized with 1% lidocaine with epinephrine. Ultrasound image documentation was performed. The microwire was kinked to measure appropriate catheter length. A  stiff glidewire was advanced to the level of the IVC. Under fluoroscopic guidance, the venotomy was serially dilated, ultimately allowing placement of a 20 cm temporary Mahurkur catheter with tip ultimately terminating within the superior aspect of the right atrium. Final catheter positioning was confirmed and documented with a spot radiographic image. The catheter aspirates and flushes normally. The catheter was flushed with appropriate volume heparin dwells. The catheter exit site was secured with a 0-Prolene retention suture. A dressing was placed. The patient tolerated the procedure well without immediate post procedural complication. IMPRESSION: Successful placement of a right internal jugular approach 20 cm temporary dialysis catheter with tip terminating with in the superior aspect of the right atrium. The catheter is ready for immediate use. PLAN: This catheter may be converted to a tunneled dialysis catheter at a later date as indicated. Electronically Signed   By: Sandi Mariscal M.D.   On: 08/04/2016 12:51     Scheduled Meds: . amLODipine  10 mg Oral Daily  . atorvastatin  40 mg Oral q1800  . carvedilol  6.25 mg Oral BID WC  . cyanocobalamin  1,000 mcg Intramuscular Daily  . folic acid  1 mg Oral Daily  . hydrALAZINE  10 mg Intravenous Once  . LORazepam  1 mg Intravenous Once   Continuous Infusions: . sodium chloride 125 mL/hr (08/03/16 2244)  . sodium chloride    . sodium chloride    . sodium chloride    . calcium gluconate IVPB    . citrate dextrose    . citrate dextrose       LOS: 3 days   Time Spent in minutes   45 minutes  Brynlynn Walko D.O. on 08/06/2016 at 9:51 AM  Between 7am to 7pm - Pager - [phone]  After 7pm go to [email or web address] - password [email or web address]  And look for the night coverage person covering for me after hours  [facility] Office  [phone]

## 2016-08-06 NOTE — Progress Notes (Signed)
Progress Note  Patient Name: Carrie Huber Date of Encounter: 08/06/2016  Primary Cardiologist: New  Dr. Oval Linsey   Subjective   No chest pain and no SOB.  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  Inpatient Medications    Scheduled Meds: . amLODipine  10 mg Oral Daily  . atorvastatin  40 mg Oral q1800  . carvedilol  6.25 mg Oral BID WC  . cyanocobalamin  1,000 mcg Intramuscular Daily  . folic acid  1 mg Oral Daily  . hydrALAZINE  10 mg Intravenous Once  . LORazepam  1 mg Intravenous Once  . potassium chloride  40 mEq Oral Once   Continuous Infusions: . sodium chloride 125 mL/hr (08/03/16 2244)  . sodium chloride    . sodium chloride    . sodium chloride    . calcium gluconate IVPB    . citrate dextrose    . citrate dextrose     PRN Meds: acetaminophen, acetaminophen, ALPRAZolam, diphenhydrAMINE, diphenhydrAMINE, hydrALAZINE, lidocaine (PF), morphine injection, nitroGLYCERIN, ondansetron (ZOFRAN) IV, zolpidem   Vital Signs    Vitals:   08/05/16 2001 08/05/16 2039 08/05/16 2257 08/06/16 0539  BP: (!) 147/80  (!) 151/87 (!) 117/99  Pulse: 85 71 72 64  Resp: _0 Temp: 98.6 F (37 C) 98.5 F (36.9 C) 97.6 F (36.4 C) 98.1 F (36.7 C)  TempSrc:      SpO2: 100% 98% 99% 99%  Weight:    197 lb (89.4 kg)  Height:        Intake/Output Summary (Last 24 hours) at 08/06/16 0959 Last data filed at 08/06/16 0953  Gross per 24 hour  Intake          2017.08 ml  Output             1700 ml  Net           317.08 ml   Filed Weights   08/05/16 0353 08/05/16 1500 08/06/16 0539  Weight: 197 lb 12.8 oz (89.7 kg) 192 lb (87.1 kg) 197 lb (89.4 kg)    Telemetry    SR with freq PVCs and NSVT several episodes this AM  - Personally Reviewed  ECG    Last (08/04/16) with prolonged QTc will recheck - Personally Reviewed  Physical Exam   GEN: No acute distress.   Neck: No JVD Cardiac: RRR, no murmurs, rubs, or gallops.    Respiratory: Clear to auscultation bilaterally. GI: Soft, nontender, non-distended  MS: No edema; No deformity. Neuro:  Nonfocal  Psych: Normal affect   Labs    Chemistry Recent Labs Lab 08/04/16 0843 08/05/16 0323 08/06/16 0427  NA 137 141 142  K 3.2* 3.5 3.1*  CL 105 105 105  CO2 _1 GLUCOSE 125* 143* 109*  BUN _2 CREATININE 1.16* 1.20* 1.13*  CALCIUM 7.9* 7.9* 8.4*  PROT 7.0 6.3* 6.1*  ALBUMIN 2.6* 2.9* 3.4*  AST _3 ALT _4 ALKPHOS 62 55 53  BILITOT 2.8* 1.3* 2.3*  GFRNONAA 49* 47* 50*  GFRAA 56* 54* 58*  ANIONGAP _5 Hematology Recent Labs Lab 08/04/16 0843  08/05/16 0323 08/06/16 0427  WBC 3.8*  --  4.1 6.6  RBC 2.48*  < > 2.56*  2.56* 3.57*  3.57*  HGB 7.1*  --  7.4* 10.0*  HCT 22.4*  --  23.3*  31.5*  MCV 90.3  --  91.0 88.2  MCH 28.6  --  28.9 28.0  MCHC 31.7  --  31.8 31.7  RDW 22.3*  --  22.4* 20.8*  PLT 43*  --  30* 64*  < > = values in this interval not displayed.  Cardiac Enzymes Recent Labs Lab 08/03/16 1932 08/03/16 2138  TROPONINI 0.60* 0.86*   No results for input(s): TROPIPOC in the last 168 hours.   BNPNo results for input(s): BNP, PROBNP in the last 168 hours.   DDimer No results for input(s): DDIMER in the last 168 hours.   Radiology    Ir Cyndy Freeze Guide Cv Line Right  Result Date: 08/04/2016 INDICATION: History of multiple myeloma, now with severe thrombocytopenia, worrisome for TTP. Please perform temporary dialysis catheter placement for the initiation of plasmapheresis. EXAM: NON-TUNNELED CENTRAL VENOUS HEMODIALYSIS CATHETER PLACEMENT WITH ULTRASOUND AND FLUOROSCOPIC GUIDANCE COMPARISON:  None. MEDICATIONS: None FLUOROSCOPY TIME:  54 seconds (6 mGy) COMPLICATIONS: None immediate. PROCEDURE: Informed written consent was obtained from the patient after a discussion of the risks, benefits, and alternatives to treatment. Questions regarding the procedure were encouraged and answered. The right neck  and chest were prepped with chlorhexidine in a sterile fashion, and a sterile drape was applied covering the operative field. Maximum barrier sterile technique with sterile gowns and gloves were used for the procedure. A timeout was performed prior to the initiation of the procedure. After creating a small venotomy incision, a micropuncture kit was utilized to access the right internal jugular vein under direct, real-time ultrasound guidance after the overlying soft tissues were anesthetized with 1% lidocaine with epinephrine. Ultrasound image documentation was performed. The microwire was kinked to measure appropriate catheter length. A stiff glidewire was advanced to the level of the IVC. Under fluoroscopic guidance, the venotomy was serially dilated, ultimately allowing placement of a 20 cm temporary Mahurkur catheter with tip ultimately terminating within the superior aspect of the right atrium. Final catheter positioning was confirmed and documented with a spot radiographic image. The catheter aspirates and flushes normally. The catheter was flushed with appropriate volume heparin dwells. The catheter exit site was secured with a 0-Prolene retention suture. A dressing was placed. The patient tolerated the procedure well without immediate post procedural complication. IMPRESSION: Successful placement of a right internal jugular approach 20 cm temporary dialysis catheter with tip terminating with in the superior aspect of the right atrium. The catheter is ready for immediate use. PLAN: This catheter may be converted to a tunneled dialysis catheter at a later date as indicated. Electronically Signed   By: Sandi Mariscal M.D.   On: 08/04/2016 12:51   Ir US Guide Vasc Access Right  Result Date: 08/04/2016 INDICATION: History of multiple myeloma, now with severe thrombocytopenia, worrisome for TTP. Please perform temporary dialysis catheter placement for the initiation of plasmapheresis. EXAM: NON-TUNNELED CENTRAL  VENOUS HEMODIALYSIS CATHETER PLACEMENT WITH ULTRASOUND AND FLUOROSCOPIC GUIDANCE COMPARISON:  None. MEDICATIONS: None FLUOROSCOPY TIME:  54 seconds (6 mGy) COMPLICATIONS: None immediate. PROCEDURE: Informed written consent was obtained from the patient after a discussion of the risks, benefits, and alternatives to treatment. Questions regarding the procedure were encouraged and answered. The right neck and chest were prepped with chlorhexidine in a sterile fashion, and a sterile drape was applied covering the operative field. Maximum barrier sterile technique with sterile gowns and gloves were used for the procedure. A timeout was performed prior to the initiation of the procedure. After creating a small venotomy incision, a micropuncture  kit was utilized to access the right internal jugular vein under direct, real-time ultrasound guidance after the overlying soft tissues were anesthetized with 1% lidocaine with epinephrine. Ultrasound image documentation was performed. The microwire was kinked to measure appropriate catheter length. A stiff glidewire was advanced to the level of the IVC. Under fluoroscopic guidance, the venotomy was serially dilated, ultimately allowing placement of a 20 cm temporary Mahurkur catheter with tip ultimately terminating within the superior aspect of the right atrium. Final catheter positioning was confirmed and documented with a spot radiographic image. The catheter aspirates and flushes normally. The catheter was flushed with appropriate volume heparin dwells. The catheter exit site was secured with a 0-Prolene retention suture. A dressing was placed. The patient tolerated the procedure well without immediate post procedural complication. IMPRESSION: Successful placement of a right internal jugular approach 20 cm temporary dialysis catheter with tip terminating with in the superior aspect of the right atrium. The catheter is ready for immediate use. PLAN: This catheter may be  converted to a tunneled dialysis catheter at a later date as indicated. Electronically Signed   By: Sandi Mariscal M.D.   On: 08/04/2016 12:51    Cardiac Studies   Echo 08/05/16  Study Conclusions  - Left ventricle: The cavity size was normal. Systolic function was   normal. The estimated ejection fraction was in the range of 55%   to 60%. Wall motion was normal; there were no regional wall   motion abnormalities. Features are consistent with a pseudonormal   left ventricular filling pattern, with concomitant abnormal   relaxation and increased filling pressure (grade 2 diastolic   dysfunction). - Aortic valve: There was trivial regurgitation. - Mitral valve: There was mild regurgitation. - Right ventricle: The cavity size was mildly dilated. Wall   thickness was normal. - Pulmonary arteries: Systolic pressure was mildly increased. PA   peak pressure: 35 mm Hg (S).  Patient Profile     66 y.o. female with HTN, multiple myeloma (s/p auto HSCT 2010), PE, anemia, CKD p/w weakness. She has no know coronary history, however now presents with a positive troponin among multiple other metabolic and laboratory abnormalities (worsening renal function, worsening anemia, elevated bilirubin, elevated LDH, acute thrombocytopenia).   "Given the constellation of abnormalities, I do not believe that her presentation is due to acute coronary syndrome. I am more concerned about either recurrence of her malignancy, sepsis, or medication (lenalidomide) toxicity. Given her severe thrombocytopenia and low likelihood for primary ACS, I would recommend against treating her for ACS with heparin at this time." per Dr. Neena Rhymes Fellow    Assessment & Plan    Freq PVCs this AM and NSVT short bursts.  Possible due to hypokalemia and QTc on 08/04/16 was 492 ms. --EKG now SR no acute changes and QTc 454 ms  Hypokalemia 40 meq has been ordered, will repeat 40 meq as well. K+ 3.1  May be cause of PVCs -- mag level  is 2.0  Elevated troponin with pk of 0.86 now with normal EF and No RWMA. G2 DD, PA pressure 35 mmHg - possible demand ischemia due to anemia of hgb 8 and HTN on admit - no heparin or ASA due to thrombocytopenia  Multiple myeloma with anemia and thrombocytopenia. Per IM --transfused plts on admit 7,000 now after platelet transfusion at 64,000  --HGB 8 on admit and transfused 2 U PRBC Hgb now 10 --patient started plasmapheresis on 08/04/2016, will continue per oncology   Encephalopathy CT head without  acute abnormality.   B12 def. Per Oncology and IM  HTN  180/89 on admit now improved on amlodipine 10 coreg, 6.25 BID,  Today BP 151/87   ? Need for out pt nuc vs. No further work up -  Dr. Oval Linsey to see.  Signed, Cecilie Kicks, NP  08/06/2016, 9:59 AM

## 2016-08-07 LAB — RETICULOCYTES
RBC.: 3.45 MIL/uL — ABNORMAL LOW (ref 3.87–5.11)
RETIC COUNT ABSOLUTE: 155.3 10*3/uL (ref 19.0–186.0)
RETIC CT PCT: 4.5 % — AB (ref 0.4–3.1)

## 2016-08-07 LAB — COMPREHENSIVE METABOLIC PANEL
ALK PHOS: 52 U/L (ref 38–126)
ALT: 19 U/L (ref 14–54)
AST: 19 U/L (ref 15–41)
Albumin: 3.3 g/dL — ABNORMAL LOW (ref 3.5–5.0)
Anion gap: 9 (ref 5–15)
BILIRUBIN TOTAL: 1.7 mg/dL — AB (ref 0.3–1.2)
BUN: 17 mg/dL (ref 6–20)
CALCIUM: 8.4 mg/dL — AB (ref 8.9–10.3)
CHLORIDE: 104 mmol/L (ref 101–111)
CO2: 30 mmol/L (ref 22–32)
CREATININE: 1.26 mg/dL — AB (ref 0.44–1.00)
GFR calc non Af Amer: 44 mL/min — ABNORMAL LOW (ref >60)
GFR, EST AFRICAN AMERICAN: 51 mL/min — AB (ref >60)
Glucose, Bld: 104 mg/dL — ABNORMAL HIGH (ref 65–99)
Potassium: 3.7 mmol/L (ref 3.5–5.1)
Sodium: 143 mmol/L (ref 135–145)
Total Protein: 5.9 g/dL — ABNORMAL LOW (ref 6.5–8.1)

## 2016-08-07 LAB — THERAPEUTIC PLASMA EXCHANGE (BLOOD BANK)
PLASMA VOLUME NEEDED: 2849
UNIT DIVISION: 0
UNIT DIVISION: 0
UNIT DIVISION: 0
UNIT DIVISION: 0
UNIT DIVISION: 0
Unit division: 0
Unit division: 0
Unit division: 0
Unit division: 0

## 2016-08-07 LAB — HEMOGLOBIN A1C
HEMOGLOBIN A1C: 5.6 % (ref 4.8–5.6)
Mean Plasma Glucose: 114 mg/dL

## 2016-08-07 LAB — POCT I-STAT, CHEM 8
BUN: 20 mg/dL (ref 6–20)
CREATININE: 1.2 mg/dL — AB (ref 0.44–1.00)
Calcium, Ion: 1.08 mmol/L — ABNORMAL LOW (ref 1.15–1.40)
Chloride: 103 mmol/L (ref 101–111)
GLUCOSE: 152 mg/dL — AB (ref 65–99)
HCT: 30 % — ABNORMAL LOW (ref 36.0–46.0)
HEMOGLOBIN: 10.2 g/dL — AB (ref 12.0–15.0)
POTASSIUM: 3.3 mmol/L — AB (ref 3.5–5.1)
Sodium: 145 mmol/L (ref 135–145)
TCO2: 26 mmol/L (ref 0–100)

## 2016-08-07 LAB — MULTIPLE MYELOMA PANEL, SERUM
ALBUMIN/GLOB SERPL: 0.9 (ref 0.7–1.7)
ALPHA2 GLOB SERPL ELPH-MCNC: 0.5 g/dL (ref 0.4–1.0)
Albumin SerPl Elph-Mcnc: 3 g/dL (ref 2.9–4.4)
Alpha 1: 0.2 g/dL (ref 0.0–0.4)
B-GLOBULIN SERPL ELPH-MCNC: 1.2 g/dL (ref 0.7–1.3)
GAMMA GLOB SERPL ELPH-MCNC: 1.7 g/dL (ref 0.4–1.8)
Globulin, Total: 3.6 g/dL (ref 2.2–3.9)
IGG (IMMUNOGLOBIN G), SERUM: 1628 mg/dL — AB (ref 700–1600)
IgA: 712 mg/dL — ABNORMAL HIGH (ref 87–352)
IgM, Serum: 35 mg/dL (ref 26–217)
Total Protein ELP: 6.6 g/dL (ref 6.0–8.5)

## 2016-08-07 LAB — UIFE/LIGHT CHAINS/TP QN, 24-HR UR
% BETA, Urine: 11.1 %
ALPHA 1 URINE: 4.6 %
Albumin, U: 68.9 %
Alpha 2, Urine: 2.7 %
FREE LT CHN EXCR RATE: 439 mg/L — AB (ref 1.35–24.19)
Free Kappa/Lambda Ratio: 4.06 (ref 2.04–10.37)
Free Lambda Lt Chains,Ur: 108 mg/L — ABNORMAL HIGH (ref 0.24–6.66)
GAMMA GLOBULIN URINE: 12.7 %
TOTAL PROTEIN, URINE-UPE24: 129.3 mg/dL
TOTAL PROTEIN, URINE-UR/DAY: 2327 mg/(24.h) — AB (ref 30–150)
TOTAL VOLUME: 1800
Time: 24 hours
VOLUME, URINE-UPE24: 1800 mL

## 2016-08-07 LAB — CBC WITH DIFFERENTIAL/PLATELET
BASOS ABS: 0 10*3/uL (ref 0.0–0.1)
Basophils Relative: 1 %
EOS ABS: 0.1 10*3/uL (ref 0.0–0.7)
EOS PCT: 2 %
HCT: 31.1 % — ABNORMAL LOW (ref 36.0–46.0)
Hemoglobin: 9.8 g/dL — ABNORMAL LOW (ref 12.0–15.0)
LYMPHS PCT: 44 %
Lymphs Abs: 2.6 10*3/uL (ref 0.7–4.0)
MCH: 28.4 pg (ref 26.0–34.0)
MCHC: 31.5 g/dL (ref 30.0–36.0)
MCV: 90.1 fL (ref 78.0–100.0)
MONO ABS: 0.4 10*3/uL (ref 0.1–1.0)
Monocytes Relative: 7 %
Neutro Abs: 2.7 10*3/uL (ref 1.7–7.7)
Neutrophils Relative %: 46 %
PLATELETS: 98 10*3/uL — AB (ref 150–400)
RBC: 3.45 MIL/uL — ABNORMAL LOW (ref 3.87–5.11)
RDW: 20.7 % — AB (ref 11.5–15.5)
WBC: 5.8 10*3/uL (ref 4.0–10.5)

## 2016-08-07 LAB — LIPID PANEL
Cholesterol: 123 mg/dL (ref 0–200)
HDL: 35 mg/dL — ABNORMAL LOW (ref >40)
LDL Cholesterol: 62 mg/dL (ref 0–99)
Total CHOL/HDL Ratio: 3.5 RATIO
Triglycerides: 132 mg/dL (ref <150)
VLDL: 26 mg/dL (ref 0–40)

## 2016-08-07 LAB — HAPTOGLOBIN

## 2016-08-07 LAB — PATHOLOGIST SMEAR REVIEW

## 2016-08-07 LAB — LACTATE DEHYDROGENASE: LDH: 186 U/L (ref 98–192)

## 2016-08-07 MED ORDER — ANTICOAGULANT SODIUM CITRATE 4% (200MG/5ML) IV SOLN
5.0000 mL | Freq: Once | Status: DC
  Filled 2016-08-07: qty 250

## 2016-08-07 MED ORDER — ACD FORMULA A 0.73-2.45-2.2 GM/100ML VI SOLN
500.0000 mL | Status: DC
  Filled 2016-08-07: qty 500

## 2016-08-07 MED ORDER — CALCIUM CARBONATE ANTACID 500 MG PO CHEW
CHEWABLE_TABLET | ORAL | Status: AC
  Administered 2016-08-07: 400 mg via ORAL
  Filled 2016-08-07: qty 4

## 2016-08-07 MED ORDER — ACD FORMULA A 0.73-2.45-2.2 GM/100ML VI SOLN
Status: AC
  Filled 2016-08-07: qty 500

## 2016-08-07 MED ORDER — CALCIUM CARBONATE ANTACID 500 MG PO CHEW
2.0000 | CHEWABLE_TABLET | ORAL | Status: AC
  Administered 2016-08-07: 400 mg via ORAL

## 2016-08-07 MED ORDER — ACETAMINOPHEN 325 MG PO TABS: 650.0000 mg | ORAL_TABLET | ORAL | Status: DC | PRN

## 2016-08-07 MED ORDER — SODIUM CHLORIDE 0.9 % IV SOLN
2.0000 g | Freq: Once | INTRAVENOUS | Status: AC
  Administered 2016-08-07: 2 g via INTRAVENOUS
  Filled 2016-08-07 (×2): qty 20

## 2016-08-07 MED ORDER — PREDNISONE 20 MG PO TABS
80.0000 mg | ORAL_TABLET | Freq: Every day | ORAL | Status: DC
  Administered 2016-08-08 – 2016-08-09 (×2): 80 mg via ORAL
  Filled 2016-08-07 (×2): qty 4

## 2016-08-07 MED ORDER — DIPHENHYDRAMINE HCL 25 MG PO CAPS: 25.0000 mg | ORAL_CAPSULE | Freq: Four times a day (QID) | ORAL | Status: DC | PRN

## 2016-08-07 MED ORDER — ACD FORMULA A 0.73-2.45-2.2 GM/100ML VI SOLN
Status: AC
  Administered 2016-08-07: 500 mL via INTRAVENOUS
  Filled 2016-08-07: qty 500

## 2016-08-07 NOTE — Progress Notes (Signed)
PROGRESS NOTE    Carrie Huber  OZH:[id number] DOB: April 12, 1950 DOA: 08/03/2016 PCP: Viann Fish, RN   Chief Complaint  Patient presents with  . Weakness    Brief Narrative:  HPI on 08/03/2016 by Dr Trixie Dredge is a 66 y.o. female with medical history significant of hypertension, multiple myeloma (s/p of autologous bone marrow transplantation 2010), PE 7 years ago, anemia, chronic kidney disease-stage II, who presents with generalized weakness. Patient states that she has been having generalized weakness in the past 4 days, which has been progressively getting worse. She does not have unilateral weakness, numbness or tingling in extremities. No facial droop or slurred speech. Denies symptoms of UTI. No nausea, vomiting, diarrhea or abdominal pain. Patient does not have chest pain or shortness of breath. No cough, fever or chills. Patient blood pressure elevated at SBP 200s initally, then 180/89.   Interim history Admitted with profound thrombocytopenia and anemia. Oncology consulted and started plasmapheresis.  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 Alvy Bimler. Blood smear reviewed revealing schistocytes. Tear drop cells and basophilic stippling also noted.   -Interventional radiology consulted and appreciated, s/p HD catheter placement -pending ADAMTS13, haptoglobin -patient started plasmapheresis on 08/04/2016, will continue per oncology  -iron and ferritin WNL  Acute encephalopathy -patient word finding and having a nonsensical conversation on 6/2 -CT head upon admission showed no acute intracranial  abnormality -Given acute changes, MRI brain was ordered, however patient has refused. -Patient noted to have B12 deficiency- started on supplementation IM -?possibly secondary to the above, currently patient appears to be at baseline  Leukopenia -Resolved  Multiple myeloma -Patient has been on Revlimid and follows with Dr. Benay Spice -S/p bone marrow transplant in 2010 -Oncology consulted and appreciated -?Myelodysplastic syndrome -24 hour urine collection pending results -kappa 119.8/lambda 94 -MM panel: IgG 1628, IgA 712 -may need bone marrow biopsy  Elevated troponin -Currently chest pain free -Suspect secondary to severe anemia/demand ischemia -Troponin peaked at 0.86 -Cannot heparinize given severe thrombocytopenia -Cardiology consulted and appreciated -hemoglobin A1c 5.6, LDL 62 -No aspirin at this time given thrombocytopenia -Continue statin  Acute kidney injury -Suspect prerenal given drop in hemoglobin -baseline creatinine 1, upon admission 1.33 -Creatinine improving, currently 1.26 -Continue to monitor BMP  Generalized weakness -Likely related to the above -Continue to monitor   Vitamin B 12 deficincy -B12 140 -Continue IM supplementation  Hypokalemia/Hypomagnesemia -Continue to monitor and replace as needed  Accelerated hypertension -BP improved -Continue amlodipine -continue to monitor closely   DVT Prophylaxis  SCDs  Code Status: Full  Family Communication: None at bedside  Disposition Plan: Admitted. Dispo TBD  Consultants Oncology Interventional radiology Cardiology  Procedures  Placement of non-tunneled HD catheter by IR Plasmapheresis   Antibiotics   Anti-infectives    None      Subjective:   Carrie Huber seen and examined today.  Patient wishes to go home. States she is feeling better. Denies chest pain, shortness of breath, abdominal pain, N/V/D/C, headache or dizziness.   Objective:   Vitals:   08/06/16 1617 08/06/16  1722 08/06/16 2046 08/07/16 0422  BP: 119/89 (!) 153/80 129/77   Pulse: 88 75 74   Resp: '17 18 18   ' Temp: 98.7 F (  37.1 C) 97.4 F (36.3 C) 97.6 F (36.4 C)   TempSrc: Oral Oral Oral   SpO2: 99% 100% 100%   Weight: 89.5 kg (197 lb 5 oz)   88.4 kg (194 lb 12.8 oz)  Height:        Intake/Output Summary (Last 24 hours) at 08/07/16 1036 Last data filed at 08/06/16 1300  Gross per 24 hour  Intake              120 ml  Output                0 ml  Net              120 ml   Filed Weights   08/06/16 0539 08/06/16 1617 08/07/16 0422  Weight: 89.4 kg (197 lb) 89.5 kg (197 lb 5 oz) 88.4 kg (194 lb 12.8 oz)   Exam  General: Well developed, well nourished, NAD, appears stated age  HEENT: NCAT, mucous membranes moist.   Cardiovascular: S1 S2 auscultated, no rubs, murmurs or gallops. Regular rate and rhythm.  Respiratory: Clear to auscultation bilaterally with equal chest rise  Abdomen: Soft, nontender, nondistended, + bowel sounds  Extremities: warm dry without cyanosis clubbing or edema  Neuro: AAOx3, nonfocal  Psych: Normal affect and demeanor, pleasant  Data Reviewed: I have personally reviewed following labs and imaging studies  CBC:  Recent Labs Lab 08/03/16 2138 08/04/16 0843 08/04/16 1459 08/05/16 0323 08/06/16 0427 08/06/16 1422 08/07/16 0402  WBC 3.9* 3.8*  --  4.1 6.6  --  5.8  NEUTROABS 1.7 2.2  --  2.5 3.3  --  2.7  HGB 9.4* 7.1* 18.0* 7.4* 10.0* 10.9* 9.8*  HCT 27.9* 22.4* 53.0* 23.3* 31.5* 32.0* 31.1*  MCV 87.7 90.3  --  91.0 88.2  --  90.1  PLT 8* 43*  --  30* 64*  --  98*   Basic Metabolic Panel:  Recent Labs Lab 08/03/16 1932 08/04/16 0843 08/04/16 1459 08/05/16 0323 08/05/16 1108 08/06/16 0427 08/06/16 1422 08/07/16 0402  NA 139 137 142 141  --  142 144 143  K 3.0* 3.2* 3.5 3.5  --  3.1* 3.8 3.7  CL 106 105 105 105  --  105 103 104  CO2 23 24  --  25  --  31  --  30  GLUCOSE 107* 125* 95 143*  --  109* 104* 104*  BUN '18 13 15 15  ' --   '15 18 17  ' CREATININE 1.33* 1.16* 1.10* 1.20*  --  1.13* 1.10* 1.26*  CALCIUM 8.4* 7.9*  --  7.9*  --  8.4*  --  8.4*  MG 1.4*  --   --   --  2.0 1.8  --   --    GFR: Estimated Creatinine Clearance: 51.5 mL/min (A) (by C-G formula based on SCr of 1.26 mg/dL (H)). Liver Function Tests:  Recent Labs Lab 08/03/16 1932 08/04/16 0843 08/05/16 0323 08/06/16 0427 08/07/16 0402  AST '26 25 24 20 19  ' ALT '14 15 22 20 19  ' ALKPHOS 67 62 55 53 52  BILITOT 3.2* 2.8* 1.3* 2.3* 1.7*  PROT 8.0 7.0 6.3* 6.1* 5.9*  ALBUMIN 3.3* 2.6* 2.9* 3.4* 3.3*   No results for input(s): LIPASE, AMYLASE in the last 168 hours. No results for input(s): AMMONIA in the last 168 hours. Coagulation Profile:  Recent Labs Lab 08/03/16 2138 08/04/16 1123  INR 1.10 1.12   Cardiac Enzymes:  Recent Labs Lab 08/03/16 1932 08/03/16  2138 08/06/16 1023  TROPONINI 0.60* 0.86* 0.43*   BNP (last 3 results) No results for input(s): PROBNP in the last 8760 hours. HbA1C:  Recent Labs  08/06/16 1023  HGBA1C 5.6   CBG:  Recent Labs Lab 08/03/16 1852  GLUCAP 102*   Lipid Profile:  Recent Labs  08/07/16 0402  CHOL 123  HDL 35*  LDLCALC 62  TRIG 132  CHOLHDL 3.5   Thyroid Function Tests: No results for input(s): TSH, T4TOTAL, FREET4, T3FREE, THYROIDAB in the last 72 hours. Anemia Panel:  Recent Labs  08/04/16 1123  08/06/16 0427 08/07/16 0402  VITAMINB12 140*  --   --   --   FERRITIN 585*  --   --   --   TIBC 246*  --   --   --   IRON 43  --   --   --   RETICCTPCT 4.7*  < > 4.4* 4.5*  < > = values in this interval not displayed. Urine analysis:    Component Value Date/Time   COLORURINE YELLOW 08/03/2016 1839   APPEARANCEUR HAZY (A) 08/03/2016 1839   LABSPEC 1.022 08/03/2016 1839   LABSPEC 1.020 11/03/2009 1152   PHURINE 5.0 08/03/2016 1839   GLUCOSEU NEGATIVE 08/03/2016 1839   HGBUR LARGE (A) 08/03/2016 1839   BILIRUBINUR NEGATIVE 08/03/2016 1839   BILIRUBINUR Negative 11/03/2009  1152   KETONESUR NEGATIVE 08/03/2016 1839   PROTEINUR >=300 (A) 08/03/2016 1839   UROBILINOGEN 0.2 11/05/2009 1430   NITRITE NEGATIVE 08/03/2016 1839   LEUKOCYTESUR TRACE (A) 08/03/2016 1839   LEUKOCYTESUR Trace 11/03/2009 1152   Sepsis Labs: '@LABRCNTIP' (procalcitonin:4,lacticidven:4)  ) Recent Results (from the past 240 hour(s))  Urine culture     Status: Abnormal   Collection Time: 08/03/16  6:50 PM  Result Value Ref Range Status   Specimen Description URINE, CLEAN CATCH  Final   Special Requests NONE  Final   Culture MULTIPLE SPECIES PRESENT, SUGGEST RECOLLECTION (A)  Final   Report Status 08/05/2016 FINAL  Final      Radiology Studies: No results found.   Scheduled Meds: . amLODipine  10 mg Oral Daily  . atorvastatin  40 mg Oral q1800  . carvedilol  6.25 mg Oral BID WC  . cyanocobalamin  1,000 mcg Intramuscular Daily  . folic acid  1 mg Oral Daily  . hydrALAZINE  10 mg Intravenous Once  . LORazepam  1 mg Intravenous Once   Continuous Infusions: . sodium chloride 125 mL/hr (08/03/16 2244)  . sodium chloride    . sodium chloride    . sodium chloride    . citrate dextrose    . citrate dextrose       LOS: 4 days   Time Spent in minutes   45 minutes  Guadalupe Kerekes D.O. on 08/07/2016 at 10:36 AM  Between 7am to 7pm - Pager - [phone]  After 7pm go to [email or web address] - password [email or web address]  And look for the night coverage person covering for me after hours  [facility] Office  [phone]

## 2016-08-07 NOTE — Plan of Care (Signed)
Problem: Physical Regulation: Goal: Ability to maintain clinical measurements within normal limits will improve Outcome: Progressing Vital Signs stable, A/O X4, pt resting comfortably.

## 2016-08-07 NOTE — Progress Notes (Signed)
IP PROGRESS NOTE  Subjective:   Carrie Huber has no complaint. She reports tolerating the plasmapheresis well. She would like to go home.   Objective: Vital signs in last 24 hours: Blood pressure 129/77, pulse 74, temperature 97.6 F (36.4 C), temperature source Oral, resp. rate 18, height 5' 7" (1.702 m), weight 194 lb 12.8 oz (88.4 kg), SpO2 100 %.  Intake/Output from previous day: 06/04 0701 - 06/05 0700 In: 240 [P.O.:240] Out: -   Physical Exam:  HEENT: No thrush or bleeding Lungs: Clear bilaterally Cardiac: Regular rate and rhythm Abdomen: No hepatosplenomegaly Extremities: No leg edema Neurologic: Alert and oriented, moves all extremities, does not appear confused today Skin: No ecchymoses or petechiae  Right neck apheresis catheter site without evidence of infection  Lab Results:  Recent Labs  08/06/16 0427 08/06/16 1422 08/07/16 0402  WBC 6.6  --  5.8  HGB 10.0* 10.9* 9.8*  HCT 31.5* 32.0* 31.1*  PLT 64*  --  98*  Reticulocyte count-155.3  Review of peripheral blood smear from 08/04/2016: The white cell morphology is unremarkable. No blasts. The platelets are decreased in number. No platelet clumps. There are numerous schistocytes. Rare nucleated red cell.  BMET  Recent Labs  08/06/16 0427 08/06/16 1422 08/07/16 0402  NA 142 144 143  K 3.1* 3.8 3.7  CL 105 103 104  CO2 31  --  30  GLUCOSE 109* 104* 104*  BUN _0 CREATININE 1.13* 1.10* 1.26*  CALCIUM 8.4*  --  8.4*   LDH 186, bilirubin 1.7, AST 19  08/04/2016 colon Are free light chain 119.8, lambda free light chain 94 Studies/Results: No results found.  Medications: I have reviewed the patient's current medications.  Assessment/Plan: 1. Multiple myeloma, IgG kappa, status post 7 cycles of Revlimid/Decadron and Velcade with marked clinical and laboratory improvement. She is status post melphalan conditioning, followed by an autologous stem cell infusion 09/10/2008. She began maintenance  Revlimid 01/04/2009. She has persistent mild elevation of the kappa and lambda light chains 2. Neutropenia while on Revlimid at a dose of 10 mg daily. The Revlimid dose was reduced to 5 mg in September 2011 due to persistent neutropenia and a hospital admission with pneumonia/sepsis. neutropenia persists. 3. Admission 11/03/2009 with left lung pneumonia and sepsis syndrome. No specific organism was cultured during the hospital admission. She completed outpatient Levaquin therapy.  4. 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  5. Thoracic spine compression fracture, status post a kyphoplasty 12/05/2007. A metastatic bone survey was unchanged on 06/21/2016. 6. Tiny pulmonary embolism on a CT 11/20/2007, now maintained off of Coumadin.  7. Chronic tachycardia on repeat office visits at the [facility].  8. History of hypertension. 9. Admission 08/03/2016 with severe anemia/thrombocytopenia-clinical presentation and laboratory studies consistent with a diagnosis of TTP  Daily plasmapheresis initiated 08/04/2016  10. Altered mental status-most likely secondary to TTP, improved   Carrie Huber appears well this morning. Her mental status appears at baseline. The parameters of hemolysis have improved and the platelet count is rising. The myeloma panel and ADAMS13 activity are pending.  The plan is to continue daily plasmapheresis. We will consider switching to an every other day outpatient regimen when the platelet count has normalized.  I discussed the case with her son by telephone this morning.  Recommendations: 1. Continue daily plasma exchange/Solu-Medrol with follow-up of the CBC, chemistry panel, and LDH 2. Follow-up restaging myeloma panel 3. Follow-up ADAMS13 activity  from 08/04/2016 4. Continue vitamin B-12 replacement  Hematology will continue following her daily.     LOS: 4 days   Donneta Romberg, MD   08/07/2016,  9:56 AM

## 2016-08-08 ENCOUNTER — Telehealth: Payer: Self-pay | Admitting: Oncology

## 2016-08-08 DIAGNOSIS — G9340 Encephalopathy, unspecified: Secondary | ICD-10-CM

## 2016-08-08 LAB — BPAM RBC
BLOOD PRODUCT EXPIRATION DATE: 201806162359
Blood Product Expiration Date: 201806102359
Blood Product Expiration Date: 201806102359
Blood Product Expiration Date: 201806182359
ISSUE DATE / TIME: 201806031244
ISSUE DATE / TIME: 201806031737
UNIT TYPE AND RH: 5100
Unit Type and Rh: 5100
Unit Type and Rh: 600
Unit Type and Rh: 600

## 2016-08-08 LAB — THERAPEUTIC PLASMA EXCHANGE (BLOOD BANK)
PLASMA VOLUME NEEDED: 3327
UNIT DIVISION: 0
UNIT DIVISION: 0
UNIT DIVISION: 0
UNIT DIVISION: 0
UNIT DIVISION: 0
Unit division: 0
Unit division: 0
Unit division: 0
Unit division: 0
Unit division: 0

## 2016-08-08 LAB — COMPREHENSIVE METABOLIC PANEL
ALT: 16 U/L (ref 14–54)
ANION GAP: 8 (ref 5–15)
AST: 20 U/L (ref 15–41)
Albumin: 3.2 g/dL — ABNORMAL LOW (ref 3.5–5.0)
Alkaline Phosphatase: 47 U/L (ref 38–126)
BILIRUBIN TOTAL: 1.2 mg/dL (ref 0.3–1.2)
BUN: 15 mg/dL (ref 6–20)
CO2: 28 mmol/L (ref 22–32)
Calcium: 8.2 mg/dL — ABNORMAL LOW (ref 8.9–10.3)
Chloride: 105 mmol/L (ref 101–111)
Creatinine, Ser: 1.16 mg/dL — ABNORMAL HIGH (ref 0.44–1.00)
GFR calc Af Amer: 56 mL/min — ABNORMAL LOW (ref >60)
GFR calc non Af Amer: 49 mL/min — ABNORMAL LOW (ref >60)
Glucose, Bld: 113 mg/dL — ABNORMAL HIGH (ref 65–99)
POTASSIUM: 3.4 mmol/L — AB (ref 3.5–5.1)
Sodium: 141 mmol/L (ref 135–145)
Total Protein: 5.6 g/dL — ABNORMAL LOW (ref 6.5–8.1)

## 2016-08-08 LAB — TYPE AND SCREEN
ABO/RH(D): A POS
Antibody Screen: NEGATIVE
UNIT DIVISION: 0
UNIT DIVISION: 0
Unit division: 0
Unit division: 0

## 2016-08-08 LAB — POCT I-STAT, CHEM 8
BUN: 17 mg/dL (ref 6–20)
CHLORIDE: 98 mmol/L — AB (ref 101–111)
Calcium, Ion: 0.86 mmol/L — CL (ref 1.15–1.40)
Creatinine, Ser: 1.1 mg/dL — ABNORMAL HIGH (ref 0.44–1.00)
Glucose, Bld: 189 mg/dL — ABNORMAL HIGH (ref 65–99)
HCT: 31 % — ABNORMAL LOW (ref 36.0–46.0)
Hemoglobin: 10.5 g/dL — ABNORMAL LOW (ref 12.0–15.0)
Potassium: 3.2 mmol/L — ABNORMAL LOW (ref 3.5–5.1)
SODIUM: 143 mmol/L (ref 135–145)
TCO2: 27 mmol/L (ref 0–100)

## 2016-08-08 LAB — CBC WITH DIFFERENTIAL/PLATELET
BASOS PCT: 0 %
Basophils Absolute: 0 10*3/uL (ref 0.0–0.1)
EOS ABS: 0.2 10*3/uL (ref 0.0–0.7)
EOS PCT: 3 %
HCT: 31 % — ABNORMAL LOW (ref 36.0–46.0)
HEMOGLOBIN: 9.9 g/dL — AB (ref 12.0–15.0)
LYMPHS ABS: 2 10*3/uL (ref 0.7–4.0)
Lymphocytes Relative: 36 %
MCH: 28.7 pg (ref 26.0–34.0)
MCHC: 31.9 g/dL (ref 30.0–36.0)
MCV: 89.9 fL (ref 78.0–100.0)
MONOS PCT: 9 %
Monocytes Absolute: 0.5 10*3/uL (ref 0.1–1.0)
NEUTROS PCT: 52 %
Neutro Abs: 3 10*3/uL (ref 1.7–7.7)
PLATELETS: 135 10*3/uL — AB (ref 150–400)
RBC: 3.45 MIL/uL — ABNORMAL LOW (ref 3.87–5.11)
RDW: 19.9 % — AB (ref 11.5–15.5)
WBC: 5.7 10*3/uL (ref 4.0–10.5)

## 2016-08-08 LAB — LACTATE DEHYDROGENASE: LDH: 164 U/L (ref 98–192)

## 2016-08-08 LAB — RETICULOCYTES
RBC.: 3.45 MIL/uL — AB (ref 3.87–5.11)
RETIC CT PCT: 4 % — AB (ref 0.4–3.1)
Retic Count, Absolute: 138 10*3/uL (ref 19.0–186.0)

## 2016-08-08 MED ORDER — CALCIUM CARBONATE ANTACID 500 MG PO CHEW
2.0000 | CHEWABLE_TABLET | ORAL | Status: AC
  Administered 2016-08-08: 400 mg via ORAL

## 2016-08-08 MED ORDER — ACD FORMULA A 0.73-2.45-2.2 GM/100ML VI SOLN
Status: AC
  Filled 2016-08-08: qty 500

## 2016-08-08 MED ORDER — ACETAMINOPHEN 325 MG PO TABS: 650.0000 mg | ORAL_TABLET | ORAL | Status: DC | PRN

## 2016-08-08 MED ORDER — ACD FORMULA A 0.73-2.45-2.2 GM/100ML VI SOLN
500.0000 mL | Status: DC
  Filled 2016-08-08: qty 500

## 2016-08-08 MED ORDER — DIPHENHYDRAMINE HCL 25 MG PO CAPS: 25.0000 mg | ORAL_CAPSULE | Freq: Four times a day (QID) | ORAL | Status: DC | PRN

## 2016-08-08 MED ORDER — SODIUM CHLORIDE 0.9 % IV SOLN
2.0000 g | Freq: Once | INTRAVENOUS | Status: AC
  Administered 2016-08-08: 2 g via INTRAVENOUS
  Filled 2016-08-08: qty 20

## 2016-08-08 MED ORDER — CALCIUM CARBONATE ANTACID 500 MG PO CHEW
CHEWABLE_TABLET | ORAL | Status: AC
  Administered 2016-08-08: 400 mg via ORAL
  Filled 2016-08-08: qty 4

## 2016-08-08 MED ORDER — ANTICOAGULANT SODIUM CITRATE 4% (200MG/5ML) IV SOLN
5.0000 mL | Freq: Once | Status: DC
  Filled 2016-08-08: qty 250

## 2016-08-08 MED ORDER — POTASSIUM CHLORIDE CRYS ER 20 MEQ PO TBCR
40.0000 meq | EXTENDED_RELEASE_TABLET | Freq: Two times a day (BID) | ORAL | Status: AC
  Administered 2016-08-08 (×2): 40 meq via ORAL
  Filled 2016-08-08 (×2): qty 2

## 2016-08-08 MED ORDER — ACD FORMULA A 0.73-2.45-2.2 GM/100ML VI SOLN
Status: AC
  Administered 2016-08-08: 500 mL via INTRAVENOUS
  Filled 2016-08-08: qty 500

## 2016-08-08 NOTE — Consult Note (Signed)
[facility] Consultation Note Requesting Physician:  Dr. Sherrill/Dr. Allyson Sabal Reason for Consult:  Proteinuria  HPI: The patient is a 66 y.o. 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 Also chronic neutropenia attributed to Revlimid, remote small PE (not on anticoagulation). Presently admitted (6/1) for weakness, AMS with confusion, found to have plt count of 7000, Hb of 8, schistocytes on peripheral smear, and plasmapheresis was initiated for MAHA w/TTP. Platelet count is improving.  Because progressive MM could sometimes cause similar picture, her MM was restaged and in the process of that restaging was found to have significant proteinuria (24 hour urine protein of 2.3 grams) and we were asked to see.  On review of random urinalyses since 2009 at time of dx of her MM, she has had dipstick proteinuria ranging 100-300 mg% and as best I can tell was never quantified (at least not at this institution) until the present admission. She says has never been aware of issues with proteinuria or hematuria. No family hx. Has had a mild elevation in creatinine above her baseline since her admission with TTP. See tables below  Results for Carrie Huber, Carrie Huber (MRN [id number]) as of 08/08/2016 12:55  10/31/2007 09:06 11/03/2009 11:52 11/05/2009 14:30 08/03/2016 18:39  Protein 300 300 100 (A) >=300 (A)   Creatinine Summary Date/Time Value  08/08/2016 09:14 AM 1.10 (H)  08/08/2016 03:28 AM 1.16 (H)  08/07/2016 12:54 PM 1.20 (H)  08/07/2016 04:02 AM 1.26 (H)  08/06/2016 02:22 PM 1.10 (H)  08/06/2016 04:27 AM 1.13 (H)  08/05/2016 03:23 AM 1.20 (H)  08/04/2016 02:59 PM 1.10 (H)  08/04/2016 08:43 AM 1.16 (H)  08/03/2016 07:32 PM 1.33 (H)  06/18/2014 01:20 PM 1.03  09/18/2011 08:13 AM 0.97  06/19/2011 10:06 AM 0.95  12/14/2010 11:47 AM 0.86    Past Medical History:   Diagnosis Date  . Anemia   . Blood transfusion    IRRADIATE ALL PRODUCTS  . Eczema    Left foot  . Multiple myeloma 2009  . Multiple Myeloma 2009  . Pulmonary embolism ([place]) 11/20/2007  . S/P autologous bone marrow transplantation ([place]) 09/10/2008   IRRADIATE ALL BLOOD PRODUCTS  . Tachycardia, unspecified    Chronic  . Thoracic compression fracture ([place]) 10/2007   Multiple     Past Surgical History:  Procedure Laterality Date  . FIXATION KYPHOPLASTY THORACIC SPINE  12/05/2007  . IR FLUORO GUIDE CV LINE RIGHT  08/04/2016  . IR US GUIDE VASC ACCESS RIGHT  08/04/2016    Family History: History reviewed. No pertinent family history. Social History:  reports that she has never smoked. She has never used smokeless tobacco. She reports that she does not drink alcohol or use drugs.   Allergies  Allergen Reactions  . Relafen [Nabumetone]     Home medications: Prior to Admission medications   Medication Sig Start Date End Date Taking? Authorizing Provider  aspirin 81 MG tablet Take 81 mg by mouth daily.     Yes Ladell Pier, MD  lenalidomide (REVLIMID) 5 MG capsule Take 1 capsule (5 mg total) by mouth daily. 07/16/16  Yes Ladell Pier, MD    Inpatient medications: . amLODipine  10 mg Oral Daily  . atorvastatin  40 mg Oral q1800  . carvedilol  6.25 mg Oral BID WC  . cyanocobalamin  1,000 mcg Intramuscular Daily  . folic acid  1 mg Oral Daily  .  hydrALAZINE  10 mg Intravenous Once  . LORazepam  1 mg Intravenous Once  . potassium chloride  40 mEq Oral BID  . predniSONE  80 mg Oral Q breakfast    Review of Systems   Physical Exam:  Blood pressure 124/67, pulse 80, temperature 99.3 F (37.4 C), temperature source Oral, resp. rate 18, height '5\' 7"'  (1.702 m), weight 88.4 kg (194 lb 12.8 oz), SpO2 99 %.  Gen: Absolutely delightful AAF, NAD Lines/tubes: R sided pheresis catheter with dry dressing Skin: no rash, cyanosis Neck: no JVD, no bruits or LAN Chest: Clear without  crackles or wheezes Regular rhythm S1S2 No S3 Abdomen: soft, not tender No LE edema Neuro: alert, Ox3   Recent Labs Lab 08/03/16 1932 08/04/16 0843  08/05/16 0323 08/06/16 0427 08/06/16 1422 08/07/16 0402 08/07/16 1254 08/08/16 0328 08/08/16 0914  NA 139 137  < > 141 142 144 143 145 141 143  K 3.0* 3.2*  < > 3.5 3.1* 3.8 3.7 3.3* 3.4* 3.2*  CL 106 105  < > 105 105 103 104 103 105 98*  CO2 23 24  --  25 31  --  30  --  28  --   GLUCOSE 107* 125*  < > 143* 109* 104* 104* 152* 113* 189*  BUN 18 13  < > '15 15 18 17 20 15 17  ' CREATININE 1.33* 1.16*  < > 1.20* 1.13* 1.10* 1.26* 1.20* 1.16* 1.10*  CALCIUM 8.4* 7.9*  --  7.9* 8.4*  --  8.4*  --  8.2*  --   < > = values in this interval not displayed.   Recent Labs Lab 08/06/16 0427 08/07/16 0402 08/08/16 0328  AST '20 19 20  ' ALT '20 19 16  ' ALKPHOS 53 52 47  BILITOT 2.3* 1.7* 1.2  PROT 6.1* 5.9* 5.6*  ALBUMIN 3.4* 3.3* 3.2*    Recent Labs Lab 08/05/16 0323 08/06/16 0427  08/07/16 0402 08/07/16 1254 08/08/16 0328 08/08/16 0914  WBC 4.1 6.6  --  5.8  --  5.7  --   NEUTROABS 2.5 3.3  --  2.7  --  3.0  --   HGB 7.4* 10.0*  < > 9.8* 10.2* 9.9* 10.5*  HCT 23.3* 31.5*  < > 31.1* 30.0* 31.0* 31.0*  MCV 91.0 88.2  --  90.1  --  89.9  --   PLT 30* 64*  --  98*  --  135*  --   < > = values in this interval not displayed.  Recent Labs Lab 08/03/16 1932 08/03/16 2138 08/06/16 1023  TROPONINI 0.60* 0.86* 0.43*    Recent Labs Lab 08/03/16 1852  GLUCAP 102*     Recent Labs Lab 08/04/16 1123  IRON 43  TIBC 246*  FERRITIN 585*    Urinalysis Results for Carrie Huber, Carrie Huber (MRN [id number]) as of 08/08/2016 12:55  10/31/2007 09:06 11/03/2009 11:52 11/05/2009 14:30 08/03/2016 18:39  Protein 300 300 100 (A) >=300 (A)    24 hour urine Results for Carrie Huber, Carrie Huber (MRN [id number]) as of 08/08/2016 12:55  Ref. Range 08/04/2016 18:40  Time-UPE24 Latest Units: hours 24  Volume, Urine-UPE24 Latest Units: mL 1,800  Total Protein,  Urine-UPE24 Latest Ref Range: Not Estab. mg/dL 129.3  Total Protein, Urine-Ur/day Latest Ref Range: 30 - 150 mg/24 hr 2,327 (H)  ALBUMIN, U Latest Units: % 68.9  Alpha 2, Urine Latest Units: % 2.7  Free Lt Chn Excr Rate Latest Ref Range: 1.35 - 24.19 mg/L 439.00 (H)  Free  Kappa/Lambda Ratio Latest Ref Range: 2.04 - 10.37  4.06   ADAMTS13, ANA pending  Background: 66 y.o. 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 Also chronic neutropenia attributed to Revlimid, remote small PE (not on anticoagulation). Presently admitted (6/1) for weakness, AMS with confusion, found to have plt count of 7000, Hb of 8, schistocytes on peripheral smear, and plasmapheresis was initiated for MAHA w/TTP. Platelet count is improving.  Because progressive MM could sometimes cause similar picture, her MM was restaged and in the process of that restaging was found to have significant proteinuria (24 hour urine protein of 2.3 grams) and we were asked to see. On review of random urinalyses since 2009 at time of dx of her MM, she has had dipstick proteinuria ranging 100-300 mg% and as best I can tell was never quantified (at least not at this institution) until the present admission.  Assessment/Recommendations  1. Proteinuria - present by dipstick since 2009 (see above table), never quantitated until the present that I can see, and now near nephrotic range at 2.3 grams. UA otherwise benign (only 0-5RBC, few WBC's no casts). Majority of the protein is albumin, only 439 mg light chains, normal immunofixation pattern.  With duration of MM (and of proteinuria) could be dealing with amyloid, membranous or membranous like nephropathy, and then with numerous other possibilities including C3 glomerulopathy, MPGN, immunotactoid glomerulopathy, IgA disease (ie glomerulopathies seen  in non-MM pts). Unfortunately at the present time not a candidate for a diagnostic renal biopsy but down the road when platelet count normalizes/stabilizes diagnostic biopsy could be performed.  2. TTP - responding to pheresis/steroids.  3. MM - restaging as per Dr. Benay Spice  Renal disposition: I discussed with patient that once Dr. Benay Spice deems her stable and over her TTP we will arrange for me to see her in the office for laboratory reevaluation of her longstanding proteinuria (and likely if no ongoing contraindication will schedule a renal biopsy). Will [place] her kidneys while here. She is in agreement with the plan for further assessment once a stable outpt.   Jamal Maes,  MD [facility] [phone] pager 08/08/2016, 12:58 PM

## 2016-08-08 NOTE — Progress Notes (Addendum)
PROGRESS NOTE    Carrie Huber  CXK:[id number] DOB: 08-20-1950 DOA: 08/03/2016 PCP: Viann Fish, RN   Chief Complaint  Patient presents with  . Weakness    Brief Narrative:    Carrie Huber is a 66 y.o. female with medical history significant of hypertension, multiple myeloma (s/p of autologous bone marrow transplantation 2010), PE 7 years ago, anemia, chronic kidney disease-stage II, who presents with generalized weakness. Patient states that she has been having generalized weakness in the past 4 days, which has been progressively getting worse. She does not have unilateral weakness, numbness or tingling in extremities. No facial droop or slurred speech. Denies symptoms of UTI. No nausea, vomiting, diarrhea or abdominal pain. Patient does not have chest pain or shortness of breath. No cough, fever or chills. Patient blood pressure elevated at SBP 200s initally, then 180/89.   Interim history Admitted with profound thrombocytopenia and anemia. Oncology consulted and started plasmapheresis.   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 Alvy Bimler. Blood smear reviewed revealing schistocytes. Tear drop cells and basophilic stippling also noted.   -Interventional radiology consulted and appreciated, s/p HD catheter placement for pharesis -ADAMS13 activity are pending -patient started plasmapheresis on 08/04/2016, Continue daily plasma exchange/Solu-Medrol now transition to prednisone  , follow CBC, chemistry panel, and LDH -iron and ferritin WNL   Acute encephalopathy -patient word finding and having a nonsensical  conversation on 6/2 -CT head upon admission showed no acute intracranial abnormality -Given acute changes, MRI brain was ordered, however patient has refused. -Patient noted to have B12 deficiency- started on supplementation IM -?possibly secondary to the above, currently patient appears to be at baseline    Multiple myeloma status post 7 cycles of Revlimid/Decadron and Velcade with marked clinical and laboratory improvement. She is status post melphalan conditioning, followed by an autologous stem cell infusion 09/10/2008. She began maintenance Revlimid 01/04/2009.  -Patient has been on Revlimid and follows with Dr. Benay Spice -S/p bone marrow transplant in 2010 -Oncology consulted and appreciated -?Myelodysplastic syndrome -24 hour urine collection pending results -kappa 119.8/lambda 94 -MM panel: IgG 1628, IgA 712,does not suggest progression of MM -may need bone marrow biopsy   Elevated troponin -Currently chest pain free -Suspect secondary to severe anemia/demand ischemia -Troponin peaked at 0.86 -Cannot heparinize given severe thrombocytopenia -hemoglobin A1c 5.6, LDL 62 -No aspirin at this time given thrombocytopenia -Continue statin Echo revealed normal systolic function and grade 2 diastolic dysfunction.  No further evaluation or intervention at this time as per cardiology.   Acute kidney injury -Suspect prerenal given drop in hemoglobin -baseline creatinine 1, upon admission 1.33 Creatinine is 1.16 today, check ANA, We'll order PROTEIN /CREATINE , oncology requests nephrology consultation,  Dr. Lorrene Reid notified  Hypokalemia -repleted  Generalized weakness -Likely related to the above -Continue to monitor   Vitamin B 12 deficincy With him in B-12, 140 -Continue IM supplementation  Hypokalemia/Hypomagnesemia -Continue to monitor and replace as needed  Accelerated hypertension -BP improved -Continue amlodipine -continue to monitor closely   DVT Prophylaxis   SCDs  Code Status: Full  Family Communication: None at bedside  Disposition Plan: Plan as per oncology, likely in am   Consultants Oncology Interventional radiology Cardiology  Procedures  Placement of non-tunneled  HD catheter by IR Plasmapheresis   Antibiotics   Anti-infectives    None      Subjective:   Carrie Huber seen and examined today.  Patient wishes to go home. . Denies chest pain, shortness of breath,    Objective:   Vitals:   08/07/16 1455 08/07/16 1528 08/07/16 2017 08/08/16 0500  BP: 123/73 137/75 126/76 123/69  Pulse: 77 66 62   Resp: _0 Temp: 97.9 F (36.6 C) 98.5 F (36.9 C) 98.6 F (37 C) 98.6 F (37 C)  TempSrc: Oral Oral Oral Oral  SpO2: 100% 100% 100% 98%  Weight:      Height:        Intake/Output Summary (Last 24 hours) at 08/08/16 0919 Last data filed at 08/07/16 1831  Gross per 24 hour  Intake              240 ml  Output                0 ml  Net              240 ml   Filed Weights   08/06/16 0539 08/06/16 1617 08/07/16 0422  Weight: 89.4 kg (197 lb) 89.5 kg (197 lb 5 oz) 88.4 kg (194 lb 12.8 oz)   Exam  General: Well developed, well nourished, NAD, appears stated age  HEENT: NCAT, mucous membranes moist.   Cardiovascular: S1 S2 auscultated, no rubs, murmurs or gallops. Regular rate and rhythm.  Respiratory: Clear to auscultation bilaterally with equal chest rise  Abdomen: Soft, nontender, nondistended, + bowel sounds  Extremities: warm dry without cyanosis clubbing or edema  Neuro: AAOx3, nonfocal  Psych: Normal affect and demeanor, pleasant  Data Reviewed: I have personally reviewed following labs and imaging studies  CBC:  Recent Labs Lab 08/04/16 0843  08/05/16 0323 08/06/16 0427 08/06/16 1422 08/07/16 0402 08/07/16 1254 08/08/16 0328  WBC 3.8*  --  4.1 6.6  --  5.8  --  5.7  NEUTROABS 2.2  --  2.5 3.3  --  2.7  --  3.0  HGB 7.1*  < > 7.4* 10.0* 10.9* 9.8* 10.2* 9.9*  HCT 22.4*  < > 23.3*  31.5* 32.0* 31.1* 30.0* 31.0*  MCV 90.3  --  91.0 88.2  --  90.1  --  89.9  PLT 43*  --  30* 64*  --  98*  --  135*  < > = values in this interval not displayed. Basic Metabolic Panel:  Recent Labs Lab 08/03/16 1932 08/04/16 0843  08/05/16 0323 08/05/16 1108 08/06/16 0427 08/06/16 1422 08/07/16 0402 08/07/16 1254 08/08/16 0328  NA 139 137  < > 141  --  142 144 143 145 141  K 3.0* 3.2*  < > 3.5  --  3.1* 3.8 3.7 3.3* 3.4*  CL 106 105  < > 105  --  105 103 104 103 105  CO2 23 24  --  25  --  31  --  30  --  28  GLUCOSE 107* 125*  < > 143*  --  109* 104* 104* 152* 113*  BUN 18 13  < > 15  --  _1 CREATININE 1.33* 1.16*  < > 1.20*  --  1.13* 1.10* 1.26* 1.20* 1.16*  CALCIUM 8.4* 7.9*  --  7.9*  --  8.4*  --  8.4*  --  8.2*  MG 1.4*  --   --   --  2.0 1.8  --   --   --   --   < > = values in this interval not displayed. GFR: Estimated Creatinine Clearance: 55.9 mL/min (A) (by C-G formula based on SCr of 1.16 mg/dL (H)). Liver Function Tests:  Recent Labs Lab 08/04/16 0843 08/05/16 0323 08/06/16 0427 08/07/16 0402 08/08/16 0328  AST _0 ALT _1 ALKPHOS 62 55 53 52 47  BILITOT 2.8* 1.3* 2.3* 1.7* 1.2  PROT 7.0 6.3* 6.1* 5.9* 5.6*  ALBUMIN 2.6* 2.9* 3.4* 3.3* 3.2*   No results for input(s): LIPASE, AMYLASE in the last 168 hours. No results for input(s): AMMONIA in the last 168 hours. Coagulation Profile:  Recent Labs Lab 08/03/16 2138 08/04/16 1123  INR 1.10 1.12   Cardiac Enzymes:  Recent Labs Lab 08/03/16 1932 08/03/16 2138 08/06/16 1023  TROPONINI 0.60* 0.86* 0.43*   BNP (last 3 results) No results for input(s): PROBNP in the last 8760 hours. HbA1C:  Recent Labs  08/06/16 1023  HGBA1C 5.6   CBG:  Recent Labs Lab 08/03/16 1852  GLUCAP 102*   Lipid Profile:  Recent Labs  08/07/16 0402  CHOL 123  HDL 35*  LDLCALC 62  TRIG 132  CHOLHDL 3.5   Thyroid Function Tests: No results for input(s): TSH,  T4TOTAL, FREET4, T3FREE, THYROIDAB in the last 72 hours. Anemia Panel:  Recent Labs  08/07/16 0402 08/08/16 0328  RETICCTPCT 4.5* 4.0*   Urine analysis:    Component Value Date/Time   COLORURINE YELLOW 08/03/2016 1839   APPEARANCEUR HAZY (A) 08/03/2016 1839   LABSPEC 1.022 08/03/2016 1839   LABSPEC 1.020 11/03/2009 1152   PHURINE 5.0 08/03/2016 1839   GLUCOSEU NEGATIVE 08/03/2016 1839   HGBUR LARGE (A) 08/03/2016 1839   BILIRUBINUR NEGATIVE 08/03/2016 1839   BILIRUBINUR Negative 11/03/2009 1152   KETONESUR NEGATIVE 08/03/2016 1839   PROTEINUR >=300 (A) 08/03/2016 1839   UROBILINOGEN 0.2 11/05/2009 1430   NITRITE NEGATIVE 08/03/2016 1839   LEUKOCYTESUR TRACE (A) 08/03/2016 1839   LEUKOCYTESUR Trace 11/03/2009 1152   Sepsis Labs: _2 (procalcitonin:4,lacticidven:4)  ) Recent Results (from the past 240 hour(s))  Urine culture     Status: Abnormal   Collection Time: 08/03/16  6:50 PM  Result Value Ref Range Status   Specimen Description URINE, CLEAN CATCH  Final   Special Requests NONE  Final   Culture MULTIPLE SPECIES PRESENT, SUGGEST RECOLLECTION (A)  Final   Report Status 08/05/2016 FINAL  Final      Radiology Studies: No results found.   Scheduled Meds: . calcium carbonate      . amLODipine  10 mg Oral Daily  . atorvastatin  40 mg Oral q1800  . calcium carbonate  2 tablet Oral Q3H  . carvedilol  6.25 mg Oral BID WC  . cyanocobalamin  1,000 mcg Intramuscular Daily  . folic acid  1 mg Oral Daily  . hydrALAZINE  10 mg Intravenous Once  . LORazepam  1 mg Intravenous Once  . potassium chloride  40 mEq Oral BID  . predniSONE  80 mg Oral Q breakfast   Continuous Infusions: . citrate dextrose    . sodium chloride 125 mL/hr (08/03/16 2244)  . sodium chloride    . sodium chloride    . sodium chloride    . anticoagulant sodium citrate    . anticoagulant sodium citrate    . calcium gluconate IVPB    . citrate dextrose    . citrate  dextrose    . citrate  dextrose    . citrate dextrose       LOS: 5 days   Time Spent in minutes   45 minutes  Nayana Abrol D.O. on 08/08/2016 at 9:19 AM  Between 7am to 7pm - Pager -  [phone]  After 7pm go to [email or web address] - password [email or web address]  And look for the night coverage person covering for me after hours  [facility] Office  [phone]  

## 2016-08-08 NOTE — Progress Notes (Signed)
IP PROGRESS NOTE  Subjective:   She feels well. She reports no adverse reaction during plasmapheresis. She is anxious to go home.   Objective: Vital signs in last 24 hours: Blood pressure 123/69, pulse 62, temperature 98.6 F (37 C), temperature source Oral, resp. rate 18, height '5\' 7"'  (1.702 m), weight 194 lb 12.8 oz (88.4 kg), SpO2 98 %.  Intake/Output from previous day: 06/05 0701 - 06/06 0700 In: 240 [P.O.:240] Out: -   Physical Exam:  HEENT: No thrush or bleeding Lungs: Clear bilaterally Cardiac: Regular rate and rhythm Extremities: No leg edema Neurologic: Alert and oriented, no apparent confusion Skin: No ecchymoses or petechiae  Right neck apheresis catheter site without evidence of infection  Lab Results:  Recent Labs  08/07/16 0402 08/07/16 1254 08/08/16 0328  WBC 5.8  --  5.7  HGB 9.8* 10.2* 9.9*  HCT 31.1* 30.0* 31.0*  PLT 98*  --  135*    Review of peripheral blood smear from 08/04/2016: The white cell morphology is unremarkable. No blasts. The platelets are decreased in number. No platelet clumps. There are numerous schistocytes. Rare nucleated red cell.  BMET  Recent Labs  08/07/16 0402 08/07/16 1254 08/08/16 0328  NA 143 145 141  K 3.7 3.3* 3.4*  CL 104 103 105  CO2 30  --  28  GLUCOSE 104* 152* 113*  BUN '17 20 15  ' CREATININE 1.26* 1.20* 1.16*  CALCIUM 8.4*  --  8.2*   LDH 164, bilirubin 1.2, AST 20 08/04/2016: IgG 1628, IgA 712, IgM 35, no serum M spike, serum immunofixation with a polyclonal IgG and IgA,kappa and lambda are increased 24-hour urine protein 08/04/2017-2327 mg, no M spike, normal immunofixation pattern 08/04/2016 kappa free light chain 119.8, lambda free light chain 94  Medications: I have reviewed the patient's current medications.  Assessment/Plan: 1. Multiple myeloma, IgG kappa, status post 7 cycles of Revlimid/Decadron and Velcade with marked clinical and laboratory improvement. She is status post melphalan  conditioning, followed by an autologous stem cell infusion 09/10/2008. She began maintenance Revlimid 01/04/2009. She has persistent mild elevation of the kappa and lambda light chains 2. Neutropenia while on Revlimid at a dose of 10 mg daily. The Revlimid dose was reduced to 5 mg in September 2011 due to persistent neutropenia and a hospital admission with pneumonia/sepsis. neutropenia persists. 3. Admission 11/03/2009 with left lung pneumonia and sepsis syndrome. No specific organism was cultured during the hospital admission. She completed outpatient Levaquin therapy.  4. 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  5. Thoracic spine compression fracture, status post a kyphoplasty 12/05/2007. A metastatic bone survey was unchanged on 06/21/2016. 6. Tiny pulmonary embolism on a CT 11/20/2007, now maintained off of Coumadin.  7. Chronic tachycardia on repeat office visits at the [facility]  8. History of hypertension. 9. 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 Altered mental status-most likely secondary to TTP, improved   Ms. Grobe appears unchanged. Terminal status appears at baseline. She has completed 4 plasmapheresis procedures. She is tolerating the plasmapheresis well. The plan is to complete treatment 5 today. The clinical presentation remains most consistent with TTP. The ADAMS13 activity from hospital admission is still pending.  The myeloma workup does not suggest progression of the myeloma. She has nephrotic range proteinuria polyclonal light chain excretion. We need to consider a nephrology  referral. I will add an ANA to today's labs.  She will be stable for discharge to home tomorrow if the hemolytic parameters continue to improve. We will  plan for outpatient plasmapheresis on 08/10/2016.  Recommendations: 1. Continue daily plasma exchange/prednisone with follow-up of the CBC, chemistry panel, and LDH 2. Follow-up ADAMS13 activity from 08/04/2016 3. Continue vitamin B-12 replacement 4. Add ANA, renal consult to evaluate the nephrotic range proteinuria  I will continue following her daily and arrange for outpatient follow-up at the [facility].     LOS: 5 days   Donneta Romberg, MD   08/08/2016, 9:10 AM

## 2016-08-08 NOTE — Telephone Encounter (Signed)
Not able to reach patient re 6/11 f/u. Schedule mailed.

## 2016-08-09 ENCOUNTER — Other Ambulatory Visit: Payer: Self-pay | Admitting: Oncology

## 2016-08-09 ENCOUNTER — Inpatient Hospital Stay (HOSPITAL_COMMUNITY): Payer: BC Managed Care – PPO

## 2016-08-09 DIAGNOSIS — N170 Acute kidney failure with tubular necrosis: Secondary | ICD-10-CM

## 2016-08-09 LAB — THERAPEUTIC PLASMA EXCHANGE (BLOOD BANK)
Plasma Exchange: 3327
Plasma volume needed: 3327
UNIT DIVISION: 0
UNIT DIVISION: 0
UNIT DIVISION: 0
UNIT DIVISION: 0
UNIT DIVISION: 0
UNIT DIVISION: 0
UNIT DIVISION: 0
UNIT DIVISION: 0
UNIT DIVISION: 0
UNIT DIVISION: 0
Unit division: 0

## 2016-08-09 LAB — COMPREHENSIVE METABOLIC PANEL
ALK PHOS: 45 U/L (ref 38–126)
ALT: 16 U/L (ref 14–54)
ANION GAP: 9 (ref 5–15)
AST: 15 U/L (ref 15–41)
Albumin: 3.2 g/dL — ABNORMAL LOW (ref 3.5–5.0)
BUN: 19 mg/dL (ref 6–20)
CALCIUM: 9.1 mg/dL (ref 8.9–10.3)
CHLORIDE: 105 mmol/L (ref 101–111)
CO2: 27 mmol/L (ref 22–32)
Creatinine, Ser: 1.32 mg/dL — ABNORMAL HIGH (ref 0.44–1.00)
GFR calc non Af Amer: 42 mL/min — ABNORMAL LOW (ref >60)
GFR, EST AFRICAN AMERICAN: 48 mL/min — AB (ref >60)
Glucose, Bld: 111 mg/dL — ABNORMAL HIGH (ref 65–99)
Potassium: 4.4 mmol/L (ref 3.5–5.1)
SODIUM: 141 mmol/L (ref 135–145)
Total Bilirubin: 0.9 mg/dL (ref 0.3–1.2)
Total Protein: 5.5 g/dL — ABNORMAL LOW (ref 6.5–8.1)

## 2016-08-09 LAB — CBC
HCT: 30.3 % — ABNORMAL LOW (ref 36.0–46.0)
Hemoglobin: 9.6 g/dL — ABNORMAL LOW (ref 12.0–15.0)
MCH: 28.7 pg (ref 26.0–34.0)
MCHC: 31.7 g/dL (ref 30.0–36.0)
MCV: 90.7 fL (ref 78.0–100.0)
PLATELETS: 136 10*3/uL — AB (ref 150–400)
RBC: 3.34 MIL/uL — AB (ref 3.87–5.11)
RDW: 19.1 % — ABNORMAL HIGH (ref 11.5–15.5)
WBC: 6.2 10*3/uL (ref 4.0–10.5)

## 2016-08-09 LAB — ADAMTS13 ACTIVITY: Adamts 13 Activity: <2 % — ABNORMAL LOW (ref >66.8)

## 2016-08-09 LAB — ADAMTS13 ANTIBODY: ADAMTS13 Antibody: 32 u/mL — ABNORMAL HIGH (ref <12)

## 2016-08-09 LAB — ANTINUCLEAR ANTIBODIES, IFA: ANTINUCLEAR ANTIBODIES, IFA: NEGATIVE

## 2016-08-09 MED ORDER — ALPRAZOLAM 0.25 MG PO TABS: 0.2500 mg | ORAL_TABLET | Freq: Two times a day (BID) | ORAL | 0 refills | Status: DC | PRN

## 2016-08-09 MED ORDER — CALCIUM CARBONATE ANTACID 500 MG PO CHEW: 2.0000 | CHEWABLE_TABLET | ORAL | 1 refills | Status: DC

## 2016-08-09 MED ORDER — ACD FORMULA A 0.73-2.45-2.2 GM/100ML VI SOLN
500.0000 mL | Status: DC
  Administered 2016-08-09: 500 mL via INTRAVENOUS
  Filled 2016-08-09: qty 500

## 2016-08-09 MED ORDER — VITAMIN B-12 1000 MCG PO TABS: 1000.0000 ug | ORAL_TABLET | Freq: Every day | ORAL | 3 refills | Status: AC

## 2016-08-09 MED ORDER — ACD FORMULA A 0.73-2.45-2.2 GM/100ML VI SOLN
Status: AC
  Filled 2016-08-09: qty 500

## 2016-08-09 MED ORDER — AMLODIPINE BESYLATE 10 MG PO TABS: 10.0000 mg | ORAL_TABLET | Freq: Every day | ORAL | 1 refills | Status: DC

## 2016-08-09 MED ORDER — CARVEDILOL 6.25 MG PO TABS: 6.2500 mg | ORAL_TABLET | Freq: Two times a day (BID) | ORAL | 1 refills | Status: DC

## 2016-08-09 MED ORDER — ATORVASTATIN CALCIUM 40 MG PO TABS: 40.0000 mg | ORAL_TABLET | Freq: Every day | ORAL | 1 refills | Status: DC

## 2016-08-09 MED ORDER — PREDNISONE 20 MG PO TABS: 60.0000 mg | ORAL_TABLET | Freq: Every day | ORAL | 0 refills | Status: DC

## 2016-08-09 MED ORDER — VITAMIN B-12 1000 MCG PO TABS: 1000.0000 ug | ORAL_TABLET | Freq: Every day | ORAL | 3 refills | Status: DC

## 2016-08-09 MED ORDER — SODIUM CHLORIDE 0.9 % IV SOLN
2.0000 g | Freq: Once | INTRAVENOUS | Status: AC
  Administered 2016-08-09: 2 g via INTRAVENOUS
  Filled 2016-08-09: qty 20

## 2016-08-09 MED ORDER — ANTICOAGULANT SODIUM CITRATE 4% (200MG/5ML) IV SOLN
5.0000 mL | Freq: Once | Status: AC
  Administered 2016-08-09: 5 mL
  Filled 2016-08-09 (×2): qty 250

## 2016-08-09 MED ORDER — ACETAMINOPHEN 325 MG PO TABS: 650.0000 mg | ORAL_TABLET | ORAL | Status: DC | PRN

## 2016-08-09 MED ORDER — FOLIC ACID 1 MG PO TABS: 1.0000 mg | ORAL_TABLET | Freq: Every day | ORAL | 1 refills | Status: DC

## 2016-08-09 MED ORDER — CALCIUM CARBONATE ANTACID 500 MG PO CHEW: 2.0000 | CHEWABLE_TABLET | ORAL | Status: AC

## 2016-08-09 MED ORDER — DIPHENHYDRAMINE HCL 25 MG PO CAPS: 25.0000 mg | ORAL_CAPSULE | Freq: Four times a day (QID) | ORAL | Status: DC | PRN

## 2016-08-09 NOTE — Progress Notes (Signed)
IP PROGRESS NOTE  Subjective:   No complaint. She was able to ambulate in the hall. She tolerated the plasmapheresis well yesterday.   Objective: Vital signs in last 24 hours: Blood pressure (!) 143/74, pulse 64, temperature 98.2 F (36.8 C), temperature source Oral, resp. rate 17, height '5\' 7"'  (1.702 m), weight 196 lb (88.9 kg), SpO2 99 %.  Intake/Output from previous day: 06/06 0701 - 06/07 0700 In: 720 [P.O.:720] Out: -   Physical Exam:  HEENT: No thrush or bleeding Lungs: Clear bilaterally Cardiac: Regular rate and rhythm Abdomen: No hepatosplenomegaly Extremities: No leg edema Neurologic: Alert and oriented, no apparent confusion Skin: No ecchymoses or petechiae  Right neck apheresis catheter site without evidence of infection  Lab Results:  Recent Labs  08/08/16 0328 08/08/16 0914 08/09/16 0328  WBC 5.7  --  6.2  HGB 9.9* 10.5* 9.6*  HCT 31.0* 31.0* 30.3*  PLT 135*  --  136*    Review of peripheral blood smear from 08/04/2016: The white cell morphology is unremarkable. No blasts. The platelets are decreased in number. No platelet clumps. There are numerous schistocytes. Rare nucleated red cell.  BMET  Recent Labs  08/08/16 0328 08/08/16 0914 08/09/16 0328  NA 141 143 141  K 3.4* 3.2* 4.4  CL 105 98* 105  CO2 28  --  27  GLUCOSE 113* 189* 111*  BUN '15 17 19  ' CREATININE 1.16* 1.10* 1.32*  CALCIUM 8.2*  --  9.1   Bilirubin 0.9 08/04/2016: IgG 1628, IgA 712, IgM 35, no serum M spike, serum immunofixation with a polyclonal IgG and IgA,kappa and lambda are increased 24-hour urine protein 08/04/2017-2327 mg, no M spike, normal immunofixation pattern 08/04/2016 kappa free light chain 119.8, lambda free light chain 94  Medications: I have reviewed the patient's current medications.  Assessment/Plan: 1. Multiple myeloma, IgG kappa, status post 7 cycles of Revlimid/Decadron and Velcade with marked clinical and laboratory improvement. She is status post  melphalan conditioning, followed by an autologous stem cell infusion 09/10/2008. She began maintenance Revlimid 01/04/2009. She has persistent mild elevation of the kappa and lambda light chains 2. Neutropenia while on Revlimid at a dose of 10 mg daily. The Revlimid dose was reduced to 5 mg in September 2011 due to persistent neutropenia and a hospital admission with pneumonia/sepsis. neutropenia persists. 3. Admission 11/03/2009 with left lung pneumonia and sepsis syndrome. No specific organism was cultured during the hospital admission. She completed outpatient Levaquin therapy.  4. 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  5. Thoracic spine compression fracture, status post a kyphoplasty 12/05/2007. A metastatic bone survey was unchanged on 06/21/2016. 6. Tiny pulmonary embolism on a CT 11/20/2007, now maintained off of Coumadin.  7. Chronic tachycardia on repeat office visits at the [facility].  8. History of hypertension. 9. 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 Altered mental status-most likely secondary to TTP, improved   Ms. Giacobbe appears well. The platelet count and hemoglobin are stable. The bilirubin has normalized. The creatinine remains mildly elevated. She will complete another plasmapheresis today and then switch to an every other day schedule.  I appreciate the consult from Dr. Lorrene Reid. The etiology of her proteinuria is unclear.  She she appears stable for discharge to home to continue outpatient plasmapheresis.  Recommendations: 1. Plasmapheresis today and then  discharged home 2. Follow-up ADAMS13 activity from 08/04/2016-still pending 3. Continue oral vitamin U-88 and folic acid at discharge 4.  Prednisone 60 mg daily at discharge 5. Outpatient plasmapheresis on 08/11/2016 and 08/13/2016  Outpatient follow-up will be scheduled at the [facility] for the a.m. on 08/13/2016     LOS: 6 days   Donneta Romberg, MD   08/09/2016, 8:12 AM

## 2016-08-09 NOTE — Care Management Note (Signed)
Case Management Note  Patient Details  Name: YEMARIAM MAGAR MRN: [id number] Date of Birth: 1950/07/09  Subjective/Objective: Pt presented for weakness x 4 days, Thrombocytopenia/ Anemia. Platelets 7 on admit up to 136 today. Pt has had 5 sessions of Plasmapheresis so far and plan for treatment today before d/c. RN to question if pt needs tunneled cath for outpatient treatments.                    Action/Plan: No home needs identified at this time.   Expected Discharge Date:  08/09/16               Expected Discharge Plan:  Home/Self Care  In-House Referral:  NA  Discharge planning Services  CM Consult  Post Acute Care Choice:  NA Choice offered to:  NA  DME Arranged:  N/A DME Agency:  NA  HH Arranged:  NA HH Agency:  NA  Status of Service:  Completed, signed off  If discussed at [place] of Stay Meetings, dates discussed:  08-09-16  Additional Comments:  Bethena Roys, RN 08/09/2016, 10:52 AM

## 2016-08-09 NOTE — Plan of Care (Signed)
Problem: Health Behavior/Discharge Planning: Goal: Ability to manage health-related needs will improve Outcome: Progressing CM consulted for discharge needs. Plasmapheresis to be set up as out pt. at discharge.

## 2016-08-09 NOTE — Progress Notes (Signed)
Anticipate d/c to home today. Per Dr. Allyson Sabal, pt is to be d/c'd to home with current dialysis catheter intact for remaining treatments of TPE as outpatient. Pt does not need tunneled catheter or other as confirmed with Dr. Ammie Dalton, per Dr. Allyson Sabal.

## 2016-08-09 NOTE — Discharge Summary (Addendum)
Physician Discharge Summary  NNENNA MEADOR MRN: [id number] DOB/AGE: [birth year] 64 y.o.  PCP: Viann Fish, RN   Admit date: 08/03/2016 Discharge date: 08/09/2016  Discharge Diagnoses:    Principal Problem:   Elevated troponin Active Problems:   Multiple myeloma (HCC)   Generalized weakness   Hypokalemia   Thrombocytopenia (HCC)   Normocytic anemia   Acute renal failure superimposed on stage 2 chronic kidney disease ([place])   Hypertensive emergency   Symptomatic anemia   Encephalopathy    Follow-up recommendations Follow-up with PCP in 3-5 days , including all  additional recommended appointments as below Follow-up CBC, CMP in 3-5 days Patient to follow-up with Dr. Benay Spice, Izola Price, MD to continue with outpatient plasmapheresis     Current Discharge Medication List    START taking these medications   Details  ALPRAZolam (XANAX) 0.25 MG tablet Take 1 tablet (0.25 mg total) by mouth 2 (two) times daily as needed for anxiety. Qty: 30 tablet, Refills: 0    amLODipine (NORVASC) 10 MG tablet Take 1 tablet (10 mg total) by mouth daily. Qty: 30 tablet, Refills: 1    atorvastatin (LIPITOR) 40 MG tablet Take 1 tablet (40 mg total) by mouth daily at 6 PM. Qty: 30 tablet, Refills: 1    calcium carbonate (TUMS - DOSED IN MG ELEMENTAL CALCIUM) 500 MG chewable tablet Chew 2 tablets (400 mg of elemental calcium total) by mouth every 3 (three) hours. Qty: 30 tablet, Refills: 1    carvedilol (COREG) 6.25 MG tablet Take 1 tablet (6.25 mg total) by mouth 2 (two) times daily with a meal. Qty: 60 tablet, Refills: 1    folic acid (FOLVITE) 1 MG tablet Take 1 tablet (1 mg total) by mouth daily. Qty: 30 tablet, Refills: 1   Associated Diagnoses: Symptomatic anemia    predniSONE (DELTASONE) 20 MG tablet Take 3 tablets (60 mg total) by mouth daily with breakfast. Qty: 120 tablet, Refills: 0   Associated Diagnoses: Thrombocytopenia (HCC)    vitamin B-12 (CYANOCOBALAMIN) 1000 MCG  tablet Take 1 tablet (1,000 mcg total) by mouth daily. Qty: 100 tablet, Refills: 3      STOP taking these medications     aspirin 81 MG tablet      lenalidomide (REVLIMID) 5 MG capsule           Discharge Condition:Stable Discharge Instructions Get Medicines reviewed and adjusted: Please take all your medications with you for your next visit with your Primary MD  Please request your Primary MD to go over all hospital tests and procedure/radiological results at the follow up, please ask your Primary MD to get all Hospital records sent to his/her office.  If you experience worsening of your admission symptoms, develop shortness of breath, life threatening emergency, suicidal or homicidal thoughts you must seek medical attention immediately by calling 911 or calling your MD immediately if symptoms less severe.  You must read complete instructions/literature along with all the possible adverse reactions/side effects for all the Medicines you take and that have been prescribed to you. Take any new Medicines after you have completely understood and accpet all the possible adverse reactions/side effects.   Do not drive when taking Pain medications.   Do not take more than prescribed Pain, Sleep and Anxiety Medications  Special Instructions: If you have smoked or chewed Tobacco in the last 2 yrs please stop smoking, stop any regular Alcohol and or any Recreational drug use.  Wear Seat belts while driving.  Please note  You were cared for by a hospitalist during your hospital stay. Once you are discharged, your primary care physician will handle any further medical issues. Please note that NO REFILLS for any discharge medications will be authorized once you are discharged, as it is imperative that you return to your primary care physician (or establish a relationship with a primary care physician if you do not have one) for your aftercare needs so that they can reassess your need for  medications and monitor your lab values.     Allergies  Allergen Reactions  . Relafen [Nabumetone]       Disposition:    Consults:  Oncology Nephrology     Significant Diagnostic Studies:  Dg Chest 2 View  Result Date: 08/03/2016 CLINICAL DATA:  Weakness and fatigue x4 days. No dyspnea or chest pain. EXAM: CHEST  2 VIEW COMPARISON:  06/21/2016 FINDINGS: The heart size and mediastinal contours are within normal limits. Both lungs are clear. Chronic stable moderate compressions of T8 through T12 with kyphoplasties at T10 and T12. Mild superior endplate compression of L1, osteoporotic compression of L2 and L3 and superior endplate compression of the visualized L4 vertebral body appear stable. IMPRESSION: No active cardiopulmonary disease. Chronic stable thoracolumbar compression fractures. Electronically Signed   By: Ashley Royalty M.D.   On: 08/03/2016 19:32   Ct Head Wo Contrast  Result Date: 08/03/2016 CLINICAL DATA:  Generalized weakness EXAM: CT HEAD WITHOUT CONTRAST TECHNIQUE: Contiguous axial images were obtained from the base of the skull through the vertex without intravenous contrast. COMPARISON:  None. FINDINGS: Brain: No evidence of acute infarction, hemorrhage, hydrocephalus, extra-axial collection or mass lesion/mass effect. Vascular: No hyperdense vessel or unexpected calcification. Skull: Normal. Negative for fracture or focal lesion. Sinuses/Orbits: No acute finding. Other: None. IMPRESSION: No acute intracranial abnormality is noted. Electronically Signed   By: Inez Catalina M.D.   On: 08/03/2016 20:18   Ir Fluoro Guide Cv Line Right  Result Date: 08/04/2016 INDICATION: History of multiple myeloma, now with severe thrombocytopenia, worrisome for TTP. Please perform temporary dialysis catheter placement for the initiation of plasmapheresis. EXAM: NON-TUNNELED CENTRAL VENOUS HEMODIALYSIS CATHETER PLACEMENT WITH ULTRASOUND AND FLUOROSCOPIC GUIDANCE COMPARISON:  None.  MEDICATIONS: None FLUOROSCOPY TIME:  54 seconds (6 mGy) COMPLICATIONS: None immediate. PROCEDURE: Informed written consent was obtained from the patient after a discussion of the risks, benefits, and alternatives to treatment. Questions regarding the procedure were encouraged and answered. The right neck and chest were prepped with chlorhexidine in a sterile fashion, and a sterile drape was applied covering the operative field. Maximum barrier sterile technique with sterile gowns and gloves were used for the procedure. A timeout was performed prior to the initiation of the procedure. After creating a small venotomy incision, a micropuncture kit was utilized to access the right internal jugular vein under direct, real-time ultrasound guidance after the overlying soft tissues were anesthetized with 1% lidocaine with epinephrine. Ultrasound image documentation was performed. The microwire was kinked to measure appropriate catheter length. A stiff glidewire was advanced to the level of the IVC. Under fluoroscopic guidance, the venotomy was serially dilated, ultimately allowing placement of a 20 cm temporary Mah[place]kur catheter with tip ultimately terminating within the superior aspect of the right atrium. Final catheter positioning was confirmed and documented with a spot radiographic image. The catheter aspirates and flushes normally. The catheter was flushed with appropriate volume heparin dwells. The catheter exit site was secured with a 0-Prolene retention suture. A dressing was placed. The patient tolerated the  procedure well without immediate post procedural complication. IMPRESSION: Successful placement of a right internal jugular approach 20 cm temporary dialysis catheter with tip terminating with in the superior aspect of the right atrium. The catheter is ready for immediate use. PLAN: This catheter may be converted to a tunneled dialysis catheter at a later date as indicated. Electronically Signed   By: Sandi Mariscal M.D.   On: 08/04/2016 12:51   Ir US Guide Vasc Access Right  Result Date: 08/04/2016 INDICATION: History of multiple myeloma, now with severe thrombocytopenia, worrisome for TTP. Please perform temporary dialysis catheter placement for the initiation of plasmapheresis. EXAM: NON-TUNNELED CENTRAL VENOUS HEMODIALYSIS CATHETER PLACEMENT WITH ULTRASOUND AND FLUOROSCOPIC GUIDANCE COMPARISON:  None. MEDICATIONS: None FLUOROSCOPY TIME:  54 seconds (6 mGy) COMPLICATIONS: None immediate. PROCEDURE: Informed written consent was obtained from the patient after a discussion of the risks, benefits, and alternatives to treatment. Questions regarding the procedure were encouraged and answered. The right neck and chest were prepped with chlorhexidine in a sterile fashion, and a sterile drape was applied covering the operative field. Maximum barrier sterile technique with sterile gowns and gloves were used for the procedure. A timeout was performed prior to the initiation of the procedure. After creating a small venotomy incision, a micropuncture kit was utilized to access the right internal jugular vein under direct, real-time ultrasound guidance after the overlying soft tissues were anesthetized with 1% lidocaine with epinephrine. Ultrasound image documentation was performed. The microwire was kinked to measure appropriate catheter length. A stiff glidewire was advanced to the level of the IVC. Under fluoroscopic guidance, the venotomy was serially dilated, ultimately allowing placement of a 20 cm temporary Mah[place]kur catheter with tip ultimately terminating within the superior aspect of the right atrium. Final catheter positioning was confirmed and documented with a spot radiographic image. The catheter aspirates and flushes normally. The catheter was flushed with appropriate volume heparin dwells. The catheter exit site was secured with a 0-Prolene retention suture. A dressing was placed. The patient tolerated the  procedure well without immediate post procedural complication. IMPRESSION: Successful placement of a right internal jugular approach 20 cm temporary dialysis catheter with tip terminating with in the superior aspect of the right atrium. The catheter is ready for immediate use. PLAN: This catheter may be converted to a tunneled dialysis catheter at a later date as indicated. Electronically Signed   By: Sandi Mariscal M.D.   On: 08/04/2016 12:51       Filed Weights   08/06/16 1617 08/07/16 0422 08/09/16 0615  Weight: 89.5 kg (197 lb 5 oz) 88.4 kg (194 lb 12.8 oz) 88.9 kg (196 lb)     Microbiology: Recent Results (from the past 240 hour(s))  Urine culture     Status: Abnormal   Collection Time: 08/03/16  6:50 PM  Result Value Ref Range Status   Specimen Description URINE, CLEAN CATCH  Final   Special Requests NONE  Final   Culture MULTIPLE SPECIES PRESENT, SUGGEST RECOLLECTION (A)  Final   Report Status 08/05/2016 FINAL  Final       Blood Culture    Component Value Date/Time   SDES URINE, CLEAN CATCH 08/03/2016 1850   SPECREQUEST NONE 08/03/2016 1850   CULT MULTIPLE SPECIES PRESENT, SUGGEST RECOLLECTION (A) 08/03/2016 1850   REPTSTATUS 08/05/2016 FINAL 08/03/2016 1850      Labs: Results for orders placed or performed during the hospital encounter of 08/03/16 (from the past 48 hour(s))  Therapeutic plasma exchange (blood bank)  Status: None   Collection Time: 08/07/16  8:20 AM  Result Value Ref Range   Plasma Exchange THAWED 3333MLS    Plasma volume needed 3,327    Unit Number [id number]    Blood Component Type THAWED PLASMA    Unit division 00    Status of Unit ISSUED,FINAL    Transfusion Status OK TO TRANSFUSE    Unit Number [id number]    Blood Component Type THAWED PLASMA    Unit division 00    Status of Unit ISSUED,FINAL    Transfusion Status OK TO TRANSFUSE    Unit Number [id number]    Blood Component Type THAWED PLASMA    Unit division 00    Status of  Unit ISSUED,FINAL    Transfusion Status OK TO TRANSFUSE    Unit Number [id number]    Blood Component Type THAWED PLASMA    Unit division 00    Status of Unit ISSUED,FINAL    Transfusion Status OK TO TRANSFUSE    Unit Number [id number]    Blood Component Type THAWED PLASMA    Unit division 00    Status of Unit ISSUED,FINAL    Transfusion Status OK TO TRANSFUSE    Unit Number [id number]    Blood Component Type THAWED PLASMA    Unit division 00    Status of Unit ISSUED,FINAL    Transfusion Status OK TO TRANSFUSE    Unit Number [id number]    Blood Component Type THAWED PLASMA    Unit division 00    Status of Unit ISSUED,FINAL    Transfusion Status OK TO TRANSFUSE    Unit Number [id number]    Blood Component Type THAWED PLASMA    Unit division 00    Status of Unit ISSUED,FINAL    Transfusion Status OK TO TRANSFUSE    Unit Number [id number]    Blood Component Type THAWED PLASMA    Unit division 00    Status of Unit ISSUED,FINAL    Transfusion Status OK TO TRANSFUSE    Unit Number [id number]    Blood Component Type THAWED PLASMA    Unit division 00    Status of Unit ISSUED,FINAL    Transfusion Status OK TO TRANSFUSE   I-STAT, chem 8     Status: Abnormal   Collection Time: 08/07/16 12:54 PM  Result Value Ref Range   Sodium 145 135 - 145 mmol/L   Potassium 3.3 (L) 3.5 - 5.1 mmol/L   Chloride 103 101 - 111 mmol/L   BUN 20 6 - 20 mg/dL   Creatinine, Ser 1.20 (H) 0.44 - 1.00 mg/dL   Glucose, Bld 152 (H) 65 - 99 mg/dL   Calcium, Ion 1.08 (L) 1.15 - 1.40 mmol/L   TCO2 26 0 - 100 mmol/L   Hemoglobin 10.2 (L) 12.0 - 15.0 g/dL   HCT 30.0 (L) 36.0 - 46.0 %  Comprehensive metabolic panel     Status: Abnormal   Collection Time: 08/08/16  3:28 AM  Result Value Ref Range   Sodium 141 135 - 145 mmol/L   Potassium 3.4 (L) 3.5 - 5.1 mmol/L   Chloride 105 101 - 111 mmol/L   CO2 28 22 - 32 mmol/L   Glucose, Bld 113 (H) 65 - 99 mg/dL   BUN 15 6 - 20 mg/dL    Creatinine, Ser 1.16 (H) 0.44 - 1.00 mg/dL   Calcium 8.2 (L) 8.9 - 10.3 mg/dL   Total Protein 5.6 (L) 6.5 - 8.1 g/dL   Albumin 3.2 (L)  3.5 - 5.0 g/dL   AST 20 15 - 41 U/L   ALT 16 14 - 54 U/L   Alkaline Phosphatase 47 38 - 126 U/L   Total Bilirubin 1.2 0.3 - 1.2 mg/dL   GFR calc non Af Amer 49 (L) >60 mL/min   GFR calc Af Amer 56 (L) >60 mL/min    Comment: (NOTE) The eGFR has been calculated using the CKD EPI equation. This calculation has not been validated in all clinical situations. eGFR's persistently <60 mL/min signify possible Chronic Kidney Disease.    Anion gap 8 5 - 15  Lactate dehydrogenase     Status: None   Collection Time: 08/08/16  3:28 AM  Result Value Ref Range   LDH 164 98 - 192 U/L  CBC with Differential     Status: Abnormal   Collection Time: 08/08/16  3:28 AM  Result Value Ref Range   WBC 5.7 4.0 - 10.5 K/uL   RBC 3.45 (L) 3.87 - 5.11 MIL/uL   Hemoglobin 9.9 (L) 12.0 - 15.0 g/dL   HCT 31.0 (L) 36.0 - 46.0 %   MCV 89.9 78.0 - 100.0 fL   MCH 28.7 26.0 - 34.0 pg   MCHC 31.9 30.0 - 36.0 g/dL   RDW 19.9 (H) 11.5 - 15.5 %   Platelets 135 (L) 150 - 400 K/uL   Neutrophils Relative % 52 %   Neutro Abs 3.0 1.7 - 7.7 K/uL   Lymphocytes Relative 36 %   Lymphs Abs 2.0 0.7 - 4.0 K/uL   Monocytes Relative 9 %   Monocytes Absolute 0.5 0.1 - 1.0 K/uL   Eosinophils Relative 3 %   Eosinophils Absolute 0.2 0.0 - 0.7 K/uL   Basophils Relative 0 %   Basophils Absolute 0.0 0.0 - 0.1 K/uL  Reticulocytes     Status: Abnormal   Collection Time: 08/08/16  3:28 AM  Result Value Ref Range   Retic Ct Pct 4.0 (H) 0.4 - 3.1 %   RBC. 3.45 (L) 3.87 - 5.11 MIL/uL   Retic Count, Manual 138.0 19.0 - 186.0 K/uL  Therapeutic plasma exchange (blood bank)     Status: None   Collection Time: 08/08/16  6:22 AM  Result Value Ref Range   Plasma Exchange 3327 THAWED    Plasma volume needed 3,327    Unit Number [id number]    Blood Component Type THAWED PLASMA    Unit division 00     Status of Unit ISSUED,FINAL    Transfusion Status OK TO TRANSFUSE    Unit Number [id number]    Blood Component Type THAWED PLASMA    Unit division 00    Status of Unit ISSUED,FINAL    Transfusion Status OK TO TRANSFUSE    Unit Number [id number]    Blood Component Type THAWED PLASMA    Unit division 00    Status of Unit ISSUED,FINAL    Transfusion Status OK TO TRANSFUSE    Unit Number [id number]    Blood Component Type THAWED PLASMA    Unit division 00    Status of Unit ISSUED,FINAL    Transfusion Status OK TO TRANSFUSE    Unit Number [id number]    Blood Component Type THAWED PLASMA    Unit division 00    Status of Unit ISSUED,FINAL    Transfusion Status OK TO TRANSFUSE    Unit Number [id number]    Blood Component Type THAWED PLASMA    Unit division 00    Status of Unit ISSUED,FINAL  Transfusion Status OK TO TRANSFUSE    Unit Number [id number]    Blood Component Type THAWED PLASMA    Unit division 00    Status of Unit ISSUED,FINAL    Transfusion Status OK TO TRANSFUSE    Unit Number [id number]    Blood Component Type THAWED PLASMA    Unit division 00    Status of Unit ISSUED,FINAL    Transfusion Status OK TO TRANSFUSE    Unit Number [id number]    Blood Component Type THAWED PLASMA    Unit division 00    Status of Unit ISSUED,FINAL    Transfusion Status OK TO TRANSFUSE    Unit Number [id number]    Blood Component Type THAWED PLASMA    Unit division 00    Status of Unit ISSUED,FINAL    Transfusion Status OK TO TRANSFUSE    Unit Number [id number]    Blood Component Type THAWED PLASMA    Unit division 00    Status of Unit ISSUED,FINAL    Transfusion Status OK TO TRANSFUSE   I-STAT, chem 8     Status: Abnormal   Collection Time: 08/08/16  9:14 AM  Result Value Ref Range   Sodium 143 135 - 145 mmol/L   Potassium 3.2 (L) 3.5 - 5.1 mmol/L   Chloride 98 (L) 101 - 111 mmol/L   BUN 17 6 - 20 mg/dL   Creatinine, Ser 1.10 (H) 0.44 - 1.00  mg/dL   Glucose, Bld 189 (H) 65 - 99 mg/dL   Calcium, Ion 0.86 (LL) 1.15 - 1.40 mmol/L   TCO2 27 0 - 100 mmol/L   Hemoglobin 10.5 (L) 12.0 - 15.0 g/dL   HCT 31.0 (L) 36.0 - 46.0 %  Comprehensive metabolic panel     Status: Abnormal   Collection Time: 08/09/16  3:28 AM  Result Value Ref Range   Sodium 141 135 - 145 mmol/L   Potassium 4.4 3.5 - 5.1 mmol/L    Comment: DELTA CHECK NOTED   Chloride 105 101 - 111 mmol/L   CO2 27 22 - 32 mmol/L   Glucose, Bld 111 (H) 65 - 99 mg/dL   BUN 19 6 - 20 mg/dL   Creatinine, Ser 1.32 (H) 0.44 - 1.00 mg/dL   Calcium 9.1 8.9 - 10.3 mg/dL   Total Protein 5.5 (L) 6.5 - 8.1 g/dL   Albumin 3.2 (L) 3.5 - 5.0 g/dL   AST 15 15 - 41 U/L   ALT 16 14 - 54 U/L   Alkaline Phosphatase 45 38 - 126 U/L   Total Bilirubin 0.9 0.3 - 1.2 mg/dL   GFR calc non Af Amer 42 (L) >60 mL/min   GFR calc Af Amer 48 (L) >60 mL/min    Comment: (NOTE) The eGFR has been calculated using the CKD EPI equation. This calculation has not been validated in all clinical situations. eGFR's persistently <60 mL/min signify possible Chronic Kidney Disease.    Anion gap 9 5 - 15  CBC     Status: Abnormal   Collection Time: 08/09/16  3:28 AM  Result Value Ref Range   WBC 6.2 4.0 - 10.5 K/uL   RBC 3.34 (L) 3.87 - 5.11 MIL/uL   Hemoglobin 9.6 (L) 12.0 - 15.0 g/dL   HCT 30.3 (L) 36.0 - 46.0 %   MCV 90.7 78.0 - 100.0 fL   MCH 28.7 26.0 - 34.0 pg   MCHC 31.7 30.0 - 36.0 g/dL   RDW 19.1 (H) 11.5 - 15.5 %  Platelets 136 (L) 150 - 400 K/uL     Lipid Panel     Component Value Date/Time   CHOL 123 08/07/2016 0402   TRIG 132 08/07/2016 0402   HDL 35 (L) 08/07/2016 0402   CHOLHDL 3.5 08/07/2016 0402   VLDL 26 08/07/2016 0402   LDLCALC 62 08/07/2016 0402     Lab Results  Component Value Date   HGBA1C 5.6 08/06/2016       HPI :  66 y.o. 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 Also chronic neutropenia attributed to Revlimid, remote small PE (not on anticoagulation). Patient was admitted (6/1) for weakness, AMS with confusion, found to have plt count of 7000, Hb of 8, schistocytes on peripheral smear, and plasmapheresis was initiated for MAHA w/TTP. Platelet count is improving.  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 Alvy Bimler. Blood smear reviewed revealing schistocytes. Tear drop cells and basophilic stippling also noted.   -Interventional radiology consulted  , s/p HD catheter placement for pharesis -ADAMS13activity are pending -patient started plasmapheresis on 08/04/2016, patient received daily plasma exchange/Solu-Medrol now transitioned to prednisone , follow CBC, chemistry panel, and LDH -iron and ferritin WNL Oncology recommends to continue prednisone 60 mg a day, they will arrange for outpatient pheresis   Acute encephalopathy, resolved -patient word finding and having a nonsensical conversation on 6/2 -CT head upon admission showed no acute intracranial abnormality -Given acute changes, MRI brain was ordered, however patient has refused. -Patient noted to have B12 deficiency- started on supplementation IM, now changed to oral -?possibly secondary to the above, currently patient  appears to be at baseline    Multiple myeloma status post 7 cycles of Revlimid/Decadron and Velcade with marked clinical and laboratory improvement. She is status post melphalan conditioning, followed by an autologous stem cell infusion 09/10/2008. She began maintenance Revlimid 01/04/2009.  -Patient has been on Revlimid and follows with Dr. Benay Spice -S/p bone marrow transplant in 2010 -Oncology consulted and appreciated -?Myelodysplastic syndrome -24 hour urine collection  2124 mg/day, per nephrology patient is not a candidate for diagnostic renal biopsy due to low platelet count -kappa 119.8/lambda 94 -MM panel: IgG 1628, IgA 712,does not suggest progression of MM -may need bone marrow biopsy in the near future   Elevated troponin -Currently chest pain free -Suspect secondary to severe anemia/demand ischemia -Troponin peaked at 0.86 -Cannot heparinize given severe thrombocytopenia -hemoglobin A1c 5.6, LDL 62 -No aspirin at this time given thrombocytopenia -Continue statin Echo revealed normal systolic function and grade 2 diastolic dysfunction. No further evaluation or intervention at this time as per cardiology.   Acute kidney injury -Suspect prerenal given drop in hemoglobin -baseline creatinine 1, upon admission 1.33, INR 1.32 ANA pending oncology requested nephrology consultation,  Dr. Lorrene Reid has seen the patient, may need outpatient nephrology referral  Hypokalemia -repleted  Generalized weakness -Likely related to the above -Continue to monitor  Vitamin B 12 deficincy With him in B-12, 140 -Continue supplementation  Hypokalemia/Hypomagnesemia -Continue to monitor and replace as needed  Accelerated hypertension -BP improved -Continue amlodipine -continue to monitor closely     Discharge Exam: *  Blood pressure (!) 143/74, pulse 64, temperature 98.2 F (36.8 C), temperature source Oral, resp. rate 17, height '5\' 7"'  (1.702 m), weight 88.9 kg (196  lb), SpO2 99 %.   General: Well developed, well nourished, NAD, appears stated age  HEENT: NCAT, mucous membranes moist.   Cardiovascular: S1 S2 auscultated, no rubs, murmurs or gallops. Regular rate and rhythm.  Respiratory: Clear to auscultation bilaterally with equal chest rise  Abdomen: Soft, nontender, nondistended, + bowel sounds  Extremities: warm dry without cyanosis clubbing or edema  Neuro: AAOx3, nonfocal  Psych: Normal affect and demeanor, pleasant      Signed: Reyne Dumas 08/09/2016, 7:43 AM        Time spent >1 hour

## 2016-08-10 ENCOUNTER — Other Ambulatory Visit: Payer: Self-pay | Admitting: Oncology

## 2016-08-10 DIAGNOSIS — M311 Thrombotic microangiopathy: Secondary | ICD-10-CM

## 2016-08-10 DIAGNOSIS — M3119 Other thrombotic microangiopathy: Secondary | ICD-10-CM

## 2016-08-10 LAB — THERAPEUTIC PLASMA EXCHANGE (BLOOD BANK)
Plasma Exchange: 3327
Plasma volume needed: 3327
UNIT DIVISION: 0
UNIT DIVISION: 0
UNIT DIVISION: 0
UNIT DIVISION: 0
UNIT DIVISION: 0
UNIT DIVISION: 0
Unit division: 0
Unit division: 0
Unit division: 0
Unit division: 0

## 2016-08-11 ENCOUNTER — Ambulatory Visit (HOSPITAL_COMMUNITY)
Admit: 2016-08-11 | Discharge: 2016-08-11 | Disposition: A | Discharge status: Home / Self Care | Payer: BC Managed Care – PPO | Attending: Oncology | Admitting: Oncology

## 2016-08-11 DIAGNOSIS — M311 Thrombotic microangiopathy: Secondary | ICD-10-CM | POA: Insufficient documentation

## 2016-08-11 DIAGNOSIS — M3119 Other thrombotic microangiopathy: Secondary | ICD-10-CM

## 2016-08-11 LAB — COMPREHENSIVE METABOLIC PANEL
ALBUMIN: 3 g/dL — AB (ref 3.5–5.0)
ALT: 20 U/L (ref 14–54)
ANION GAP: 8 (ref 5–15)
AST: 23 U/L (ref 15–41)
Alkaline Phosphatase: 51 U/L (ref 38–126)
BUN: 21 mg/dL — AB (ref 6–20)
CHLORIDE: 105 mmol/L (ref 101–111)
CO2: 25 mmol/L (ref 22–32)
Calcium: 9.5 mg/dL (ref 8.9–10.3)
Creatinine, Ser: 1.19 mg/dL — ABNORMAL HIGH (ref 0.44–1.00)
GFR calc Af Amer: 55 mL/min — ABNORMAL LOW (ref >60)
GFR calc non Af Amer: 47 mL/min — ABNORMAL LOW (ref >60)
GLUCOSE: 210 mg/dL — AB (ref 65–99)
POTASSIUM: 3.4 mmol/L — AB (ref 3.5–5.1)
SODIUM: 138 mmol/L (ref 135–145)
TOTAL PROTEIN: 5.8 g/dL — AB (ref 6.5–8.1)
Total Bilirubin: 0.7 mg/dL (ref 0.3–1.2)

## 2016-08-11 LAB — CBC
HCT: 28.7 % — ABNORMAL LOW (ref 36.0–46.0)
HEMOGLOBIN: 9.1 g/dL — AB (ref 12.0–15.0)
MCH: 29.1 pg (ref 26.0–34.0)
MCHC: 31.7 g/dL (ref 30.0–36.0)
MCV: 91.7 fL (ref 78.0–100.0)
PLATELETS: 142 10*3/uL — AB (ref 150–400)
RBC: 3.13 MIL/uL — AB (ref 3.87–5.11)
RDW: 18.5 % — AB (ref 11.5–15.5)
WBC: 7.5 10*3/uL (ref 4.0–10.5)

## 2016-08-11 LAB — LACTATE DEHYDROGENASE: LDH: 170 U/L (ref 98–192)

## 2016-08-11 MED ORDER — ACD FORMULA A 0.73-2.45-2.2 GM/100ML VI SOLN: 500.0000 mL | Status: DC

## 2016-08-11 MED ORDER — CALCIUM CARBONATE ANTACID 500 MG PO CHEW: 2.0000 | CHEWABLE_TABLET | ORAL | Status: DC

## 2016-08-11 MED ORDER — ACETAMINOPHEN 325 MG PO TABS: 650.0000 mg | ORAL_TABLET | ORAL | Status: DC | PRN

## 2016-08-11 MED ORDER — DIPHENHYDRAMINE HCL 25 MG PO CAPS: 25.0000 mg | ORAL_CAPSULE | Freq: Four times a day (QID) | ORAL | Status: DC | PRN

## 2016-08-11 MED ORDER — ACD FORMULA A 0.73-2.45-2.2 GM/100ML VI SOLN
500.0000 mL | Status: DC
  Administered 2016-08-11: 500 mL via INTRAVENOUS

## 2016-08-11 MED ORDER — ANTICOAGULANT SODIUM CITRATE 4% (200MG/5ML) IV SOLN
5.0000 mL | Freq: Once | Status: AC
  Filled 2016-08-11: qty 250

## 2016-08-11 MED ORDER — SODIUM CHLORIDE 0.9 % IV SOLN
2.0000 g | Freq: Once | INTRAVENOUS | Status: AC
  Administered 2016-08-11: 2 g via INTRAVENOUS
  Filled 2016-08-11: qty 20

## 2016-08-11 MED ORDER — SODIUM CHLORIDE 0.9 % IV SOLN: 2.0000 g | Freq: Once | INTRAVENOUS | Status: DC

## 2016-08-11 MED ORDER — ANTICOAGULANT SODIUM CITRATE 4% (200MG/5ML) IV SOLN
5.0000 mL | Freq: Once | Status: DC
  Administered 2016-08-11: 5 mL

## 2016-08-11 MED ORDER — DIPHENHYDRAMINE HCL 25 MG PO CAPS
25.0000 mg | ORAL_CAPSULE | Freq: Four times a day (QID) | ORAL | Status: DC | PRN
Start: 2016-08-11 — End: 2016-08-11

## 2016-08-12 LAB — THERAPEUTIC PLASMA EXCHANGE (BLOOD BANK)
PLASMA VOLUME NEEDED: 3327
Plasma Exchange: 3327
UNIT DIVISION: 0
UNIT DIVISION: 0
UNIT DIVISION: 0
UNIT DIVISION: 0
UNIT DIVISION: 0
UNIT DIVISION: 0
Unit division: 0
Unit division: 0
Unit division: 0
Unit division: 0
Unit division: 0

## 2016-08-13 ENCOUNTER — Telehealth: Payer: Self-pay | Admitting: Nurse Practitioner

## 2016-08-13 ENCOUNTER — Ambulatory Visit (HOSPITAL_BASED_OUTPATIENT_CLINIC_OR_DEPARTMENT_OTHER): Payer: BC Managed Care – PPO | Admitting: Nurse Practitioner

## 2016-08-13 ENCOUNTER — Non-Acute Institutional Stay (HOSPITAL_COMMUNITY)
Admission: RE | Admit: 2016-08-13 | Discharge: 2016-08-13 | Disposition: A | Discharge status: Home / Self Care | Payer: BC Managed Care – PPO | Source: Ambulatory Visit | Attending: Oncology | Admitting: Oncology

## 2016-08-13 VITALS — BP 127/64 | HR 71 | Temp 98.7°F | Resp 18 | Ht 67.0 in | Wt 198.6 lb

## 2016-08-13 DIAGNOSIS — M311 Thrombotic microangiopathy: Secondary | ICD-10-CM

## 2016-08-13 DIAGNOSIS — C9000 Multiple myeloma not having achieved remission: Secondary | ICD-10-CM

## 2016-08-13 DIAGNOSIS — M3119 Other thrombotic microangiopathy: Secondary | ICD-10-CM

## 2016-08-13 LAB — CBC
HEMATOCRIT: 28.9 % — AB (ref 36.0–46.0)
HEMOGLOBIN: 9.2 g/dL — AB (ref 12.0–15.0)
MCH: 29.1 pg (ref 26.0–34.0)
MCHC: 31.8 g/dL (ref 30.0–36.0)
MCV: 91.5 fL (ref 78.0–100.0)
Platelets: 160 10*3/uL (ref 150–400)
RBC: 3.16 MIL/uL — ABNORMAL LOW (ref 3.87–5.11)
RDW: 18.3 % — AB (ref 11.5–15.5)
WBC: 8.2 10*3/uL (ref 4.0–10.5)

## 2016-08-13 LAB — POCT I-STAT, CHEM 8
BUN: 20 mg/dL (ref 6–20)
CREATININE: 1 mg/dL (ref 0.44–1.00)
Calcium, Ion: 1.07 mmol/L — ABNORMAL LOW (ref 1.15–1.40)
Chloride: 97 mmol/L — ABNORMAL LOW (ref 101–111)
Glucose, Bld: 109 mg/dL — ABNORMAL HIGH (ref 65–99)
HEMATOCRIT: 29 % — AB (ref 36.0–46.0)
HEMOGLOBIN: 9.9 g/dL — AB (ref 12.0–15.0)
POTASSIUM: 3.2 mmol/L — AB (ref 3.5–5.1)
Sodium: 142 mmol/L (ref 135–145)
TCO2: 28 mmol/L (ref 0–100)

## 2016-08-13 LAB — COMPREHENSIVE METABOLIC PANEL
ALBUMIN: 3.2 g/dL — AB (ref 3.5–5.0)
ALT: 24 U/L (ref 14–54)
ANION GAP: 9 (ref 5–15)
AST: 26 U/L (ref 15–41)
Alkaline Phosphatase: 52 U/L (ref 38–126)
BILIRUBIN TOTAL: 0.9 mg/dL (ref 0.3–1.2)
BUN: 18 mg/dL (ref 6–20)
CO2: 29 mmol/L (ref 22–32)
Calcium: 9.1 mg/dL (ref 8.9–10.3)
Chloride: 102 mmol/L (ref 101–111)
Creatinine, Ser: 1.07 mg/dL — ABNORMAL HIGH (ref 0.44–1.00)
GFR calc Af Amer: >60 mL/min (ref >60)
GFR calc non Af Amer: 54 mL/min — ABNORMAL LOW (ref >60)
GLUCOSE: 130 mg/dL — AB (ref 65–99)
POTASSIUM: 3.3 mmol/L — AB (ref 3.5–5.1)
SODIUM: 140 mmol/L (ref 135–145)
TOTAL PROTEIN: 5.9 g/dL — AB (ref 6.5–8.1)

## 2016-08-13 MED ORDER — SODIUM CHLORIDE 0.9 % IV SOLN
2.0000 g | Freq: Once | INTRAVENOUS | Status: AC
  Administered 2016-08-13: 2 g via INTRAVENOUS
  Filled 2016-08-13: qty 20

## 2016-08-13 MED ORDER — POTASSIUM CHLORIDE CRYS ER 20 MEQ PO TBCR: 20.0000 meq | EXTENDED_RELEASE_TABLET | Freq: Every day | ORAL | 0 refills | Status: DC

## 2016-08-13 MED ORDER — ACD FORMULA A 0.73-2.45-2.2 GM/100ML VI SOLN
500.0000 mL | Status: DC
Start: 2016-08-13 — End: 2016-08-13
  Administered 2016-08-13: 500 mL via INTRAVENOUS

## 2016-08-13 MED ORDER — ACETAMINOPHEN 325 MG PO TABS: 650.0000 mg | ORAL_TABLET | ORAL | Status: DC | PRN

## 2016-08-13 MED ORDER — DIPHENHYDRAMINE HCL 25 MG PO CAPS: 25.0000 mg | ORAL_CAPSULE | Freq: Four times a day (QID) | ORAL | Status: DC | PRN

## 2016-08-13 MED ORDER — CALCIUM CARBONATE ANTACID 500 MG PO CHEW
2.0000 | CHEWABLE_TABLET | ORAL | Status: DC
  Administered 2016-08-13: 400 mg via ORAL

## 2016-08-13 MED ORDER — ACD FORMULA A 0.73-2.45-2.2 GM/100ML VI SOLN
Status: AC
  Administered 2016-08-13: 500 mL via INTRAVENOUS
  Filled 2016-08-13: qty 500

## 2016-08-13 MED ORDER — ANTICOAGULANT SODIUM CITRATE 4% (200MG/5ML) IV SOLN
5.0000 mL | Freq: Once | Status: DC
  Filled 2016-08-13: qty 250

## 2016-08-13 MED ORDER — CALCIUM CARBONATE ANTACID 500 MG PO CHEW
CHEWABLE_TABLET | ORAL | Status: AC
  Administered 2016-08-13: 400 mg via ORAL
  Filled 2016-08-13: qty 4

## 2016-08-13 NOTE — Progress Notes (Addendum)
[place] OFFICE PROGRESS NOTE   Diagnosis:  TTP  INTERVAL HISTORY:   Carrie Huber returns for her first follow-up since hospital discharge. She was admitted on 08/03/2016 with severe anemia/thrombocytopenia. She was diagnosed with TTP. Daily plasmapheresis was initiated on 08/04/2016. Prednisone was started on 08/07/2016. Lab parameters improved. She completed the sixth daily plasmapheresis on 08/09/2016 and was then switched to an every other day schedule. She had plasmapheresis earlier today.  No unusual headaches. No diplopia. No focal weakness. She denies any bruising/bleeding. Her mother died last night. She is trying to make the funeral arrangements.  Objective:  Vital signs in last 24 hours:  Blood pressure 127/64, pulse 71, temperature 98.7 F (37.1 C), temperature source Oral, resp. rate 18, height '5\' 7"'  (1.702 m), weight 198 lb 9.6 oz (90.1 kg), SpO2 100 %.    HEENT: No thrush. Resp: Lungs clear bilaterally. Cardio: Regular rate and rhythm. GI: Abdomen soft and nontender. No organomegaly. Vascular: Trace bilateral ankle edema. Left lower leg appears slightly larger than the right lower leg. Neuro: Alert and oriented.  Skin: Ecchymosis at the left lower anterior leg.  Pheresis catheter right upper chest.   Lab Results:  Lab Results  Component Value Date   WBC 8.2 08/13/2016   HGB 9.2 (L) 08/13/2016   HCT 28.9 (L) 08/13/2016   MCV 91.5 08/13/2016   PLT 160 08/13/2016   NEUTROABS 3.0 08/08/2016    Imaging:  No results found.  Medications: I have reviewed the patient's current medications.  Assessment/Plan: 1. Multiple myeloma, IgG kappa, status post 7 cycles of Revlimid/Decadron and Velcade with marked clinical and laboratory improvement. She is status post melphalan conditioning, followed by an autologous stem cell infusion 09/10/2008. She began maintenance Revlimid 01/04/2009. She has persistent mild elevation of the kappa and lambda light  chains.  Revlimid placed on hold at time of TTP diagnosis  08/04/2016 SPEP with no M spike observed; stable mild elevation of And lambda light chains 2. Neutropenia while on Revlimid at a dose of 10 mg daily. The Revlimid dose was reduced to 5 mg in September 2011 due to persistent neutropenia and a hospital admission with pneumonia/sepsis. neutropenia persists. 3. Admission 11/03/2009 with left lung pneumonia and sepsis syndrome. No specific organism was cultured during the hospital admission. She completed outpatient Levaquin therapy.  4. 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  5. Thoracic spine compression fracture, status post a kyphoplasty 12/05/2007. A metastatic bone survey was unchanged on04/19/2018. 6. Tiny pulmonary embolism on a CT 11/20/2007, now maintained off of Coumadin.  7. [facility]  8. History of hypertension. 9. 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  Prednisone started 08/07/2016   Prednisone taper to 40 mg daily beginning 08/14/2016  10. Altered mental status-most likely secondary to TTP, resolved  11. Proteinuria on 24-hour urine 08/04/2016 (2.3 grams). Seen by Dr. Lorrene Reid during hospitalization June 2018. Renal ultrasound 08/09/2016 with no acute abnormality seen. Outpatient follow-up with Dr. Lorrene Reid planned.    Disposition: Carrie Huber appears stable. The platelet count continues to improve. Hemoglobin is stable. Mental status is at baseline.  The plan is to continue plasmapheresis on an every other day schedule for the remainder of this week (08/15/2016 and  08/17/2016). The prednisone  will be tapered to 40 mg daily. We will see her in follow-up with repeat labs in one week. She will contact the office in the interim with any problems.  Patient seen with Dr. Benay Spice.    Ned Card ANP/GNP-BC   08/13/2016  3:17 PM  This was a shared visit with Ned Card. The platelet count has normalized and the markers of hemolysis have improved. She will complete 2 more plasmapheresis treatments and return for an office visit on 08/20/2016.  Revlimid remains on hold. The myeloma panel obtained while hospitalized last week did not reveal evidence of progressive myeloma.  Carrie Huber, M.D.

## 2016-08-13 NOTE — Telephone Encounter (Signed)
Scheduled appt per 6/11 los - Gave patient AVS and calender per 6/11 los.  

## 2016-08-13 NOTE — Progress Notes (Signed)
tx completed without complications; pt denies pain. Paresthesia, n/v of dizziness. Pt was transported off the unit on a wheelchair to her awaiting husband at the [place] entrance. Pt was reminded that she has another tx on 08/15/16

## 2016-08-14 LAB — THERAPEUTIC PLASMA EXCHANGE (BLOOD BANK)
PLASMA VOLUME NEEDED: 3327
UNIT DIVISION: 0
UNIT DIVISION: 0
UNIT DIVISION: 0
UNIT DIVISION: 0
Unit division: 0
Unit division: 0
Unit division: 0
Unit division: 0
Unit division: 0
Unit division: 0
Unit division: 0

## 2016-08-15 ENCOUNTER — Non-Acute Institutional Stay (HOSPITAL_COMMUNITY)
Admission: AD | Admit: 2016-08-15 | Discharge: 2016-08-15 | Disposition: A | Discharge status: Home / Self Care | Payer: BC Managed Care – PPO | Source: Ambulatory Visit | Attending: Oncology | Admitting: Oncology

## 2016-08-15 DIAGNOSIS — M311 Thrombotic microangiopathy: Secondary | ICD-10-CM | POA: Insufficient documentation

## 2016-08-15 DIAGNOSIS — C9000 Multiple myeloma not having achieved remission: Secondary | ICD-10-CM | POA: Diagnosis not present

## 2016-08-15 LAB — LACTATE DEHYDROGENASE: LDH: 181 U/L (ref 98–192)

## 2016-08-15 LAB — CBC WITH DIFFERENTIAL/PLATELET
BASOS ABS: 0 10*3/uL (ref 0.0–0.1)
Basophils Relative: 0 %
Eosinophils Absolute: 0.1 10*3/uL (ref 0.0–0.7)
Eosinophils Relative: 1 %
HEMATOCRIT: 30.8 % — AB (ref 36.0–46.0)
Hemoglobin: 9.5 g/dL — ABNORMAL LOW (ref 12.0–15.0)
LYMPHS PCT: 40 %
Lymphs Abs: 3.6 10*3/uL (ref 0.7–4.0)
MCH: 28.8 pg (ref 26.0–34.0)
MCHC: 30.8 g/dL (ref 30.0–36.0)
MCV: 93.3 fL (ref 78.0–100.0)
MONO ABS: 0.5 10*3/uL (ref 0.1–1.0)
MONOS PCT: 5 %
NEUTROS ABS: 4.8 10*3/uL (ref 1.7–7.7)
NEUTROS PCT: 54 %
Platelets: 168 10*3/uL (ref 150–400)
RBC: 3.3 MIL/uL — ABNORMAL LOW (ref 3.87–5.11)
RDW: 18.6 % — AB (ref 11.5–15.5)
WBC: 9 10*3/uL (ref 4.0–10.5)

## 2016-08-15 LAB — COMPREHENSIVE METABOLIC PANEL
ALBUMIN: 3.1 g/dL — AB (ref 3.5–5.0)
ALT: 21 U/L (ref 14–54)
ANION GAP: 10 (ref 5–15)
AST: 24 U/L (ref 15–41)
Alkaline Phosphatase: 48 U/L (ref 38–126)
BILIRUBIN TOTAL: 0.8 mg/dL (ref 0.3–1.2)
BUN: 19 mg/dL (ref 6–20)
CO2: 25 mmol/L (ref 22–32)
CREATININE: 1.23 mg/dL — AB (ref 0.44–1.00)
Calcium: 8.9 mg/dL (ref 8.9–10.3)
Chloride: 105 mmol/L (ref 101–111)
GFR calc Af Amer: 53 mL/min — ABNORMAL LOW (ref >60)
GFR calc non Af Amer: 45 mL/min — ABNORMAL LOW (ref >60)
GLUCOSE: 162 mg/dL — AB (ref 65–99)
Potassium: 3.6 mmol/L (ref 3.5–5.1)
SODIUM: 140 mmol/L (ref 135–145)
TOTAL PROTEIN: 5.7 g/dL — AB (ref 6.5–8.1)

## 2016-08-15 MED ORDER — ACD FORMULA A 0.73-2.45-2.2 GM/100ML VI SOLN
500.0000 mL | Status: DC
  Administered 2016-08-15: 500 mL via INTRAVENOUS

## 2016-08-15 MED ORDER — DIPHENHYDRAMINE HCL 25 MG PO CAPS: 25.0000 mg | ORAL_CAPSULE | Freq: Four times a day (QID) | ORAL | Status: DC | PRN

## 2016-08-15 MED ORDER — SODIUM CHLORIDE 0.9 % IV SOLN
2.0000 g | Freq: Once | INTRAVENOUS | Status: AC
  Administered 2016-08-15: 2 g via INTRAVENOUS
  Filled 2016-08-15: qty 20

## 2016-08-15 MED ORDER — CALCIUM CARBONATE ANTACID 500 MG PO CHEW
2.0000 | CHEWABLE_TABLET | ORAL | Status: DC
  Administered 2016-08-15: 400 mg via ORAL

## 2016-08-15 MED ORDER — ANTICOAGULANT SODIUM CITRATE 4% (200MG/5ML) IV SOLN
5.0000 mL | Freq: Once | Status: AC
  Administered 2016-08-15: 5 mL
  Filled 2016-08-15: qty 5

## 2016-08-15 MED ORDER — ACETAMINOPHEN 325 MG PO TABS: 650.0000 mg | ORAL_TABLET | ORAL | Status: DC | PRN

## 2016-08-15 MED ORDER — CALCIUM CARBONATE ANTACID 500 MG PO CHEW
CHEWABLE_TABLET | ORAL | Status: AC
  Administered 2016-08-15: 400 mg via ORAL
  Filled 2016-08-15: qty 2

## 2016-08-16 ENCOUNTER — Other Ambulatory Visit: Payer: Self-pay | Admitting: Oncology

## 2016-08-16 DIAGNOSIS — M311 Thrombotic microangiopathy: Secondary | ICD-10-CM

## 2016-08-16 DIAGNOSIS — M3119 Other thrombotic microangiopathy: Secondary | ICD-10-CM

## 2016-08-16 LAB — THERAPEUTIC PLASMA EXCHANGE (BLOOD BANK)
PLASMA VOLUME NEEDED: 3327
UNIT DIVISION: 0
UNIT DIVISION: 0
Unit division: 0
Unit division: 0
Unit division: 0
Unit division: 0
Unit division: 0
Unit division: 0
Unit division: 0
Unit division: 0
Unit division: 0

## 2016-08-17 ENCOUNTER — Ambulatory Visit (HOSPITAL_COMMUNITY)
Admission: RE | Admit: 2016-08-17 | Discharge: 2016-08-17 | Disposition: A | Discharge status: Home / Self Care | Payer: BC Managed Care – PPO | Source: Ambulatory Visit | Attending: Oncology | Admitting: Oncology

## 2016-08-17 DIAGNOSIS — M3119 Other thrombotic microangiopathy: Secondary | ICD-10-CM

## 2016-08-17 DIAGNOSIS — M311 Thrombotic microangiopathy: Secondary | ICD-10-CM | POA: Diagnosis present

## 2016-08-17 LAB — CBC WITH DIFFERENTIAL/PLATELET
Basophils Absolute: 0 10*3/uL (ref 0.0–0.1)
Basophils Relative: 0 %
Eosinophils Absolute: 0.1 10*3/uL (ref 0.0–0.7)
Eosinophils Relative: 1 %
HEMATOCRIT: 29.9 % — AB (ref 36.0–46.0)
HEMOGLOBIN: 9.4 g/dL — AB (ref 12.0–15.0)
LYMPHS ABS: 3.4 10*3/uL (ref 0.7–4.0)
LYMPHS PCT: 41 %
MCH: 29.2 pg (ref 26.0–34.0)
MCHC: 31.4 g/dL (ref 30.0–36.0)
MCV: 92.9 fL (ref 78.0–100.0)
Monocytes Absolute: 0.5 10*3/uL (ref 0.1–1.0)
Monocytes Relative: 5 %
NEUTROS ABS: 4.3 10*3/uL (ref 1.7–7.7)
NEUTROS PCT: 53 %
Platelets: 151 10*3/uL (ref 150–400)
RBC: 3.22 MIL/uL — AB (ref 3.87–5.11)
RDW: 18.3 % — ABNORMAL HIGH (ref 11.5–15.5)
WBC: 8.3 10*3/uL (ref 4.0–10.5)

## 2016-08-17 LAB — COMPREHENSIVE METABOLIC PANEL
ALK PHOS: 49 U/L (ref 38–126)
ALT: 21 U/L (ref 14–54)
AST: 25 U/L (ref 15–41)
Albumin: 3.1 g/dL — ABNORMAL LOW (ref 3.5–5.0)
Anion gap: 12 (ref 5–15)
BUN: 22 mg/dL — ABNORMAL HIGH (ref 6–20)
CALCIUM: 9 mg/dL (ref 8.9–10.3)
CO2: 26 mmol/L (ref 22–32)
CREATININE: 1.42 mg/dL — AB (ref 0.44–1.00)
Chloride: 104 mmol/L (ref 101–111)
GFR, EST AFRICAN AMERICAN: 44 mL/min — AB (ref >60)
GFR, EST NON AFRICAN AMERICAN: 38 mL/min — AB (ref >60)
Glucose, Bld: 192 mg/dL — ABNORMAL HIGH (ref 65–99)
Potassium: 3.7 mmol/L (ref 3.5–5.1)
SODIUM: 142 mmol/L (ref 135–145)
Total Bilirubin: 0.6 mg/dL (ref 0.3–1.2)
Total Protein: 5.8 g/dL — ABNORMAL LOW (ref 6.5–8.1)

## 2016-08-17 LAB — LACTATE DEHYDROGENASE: LDH: 181 U/L (ref 98–192)

## 2016-08-17 MED ORDER — SODIUM CHLORIDE 0.9 % IV SOLN: 2.0000 g | Freq: Once | INTRAVENOUS | Status: DC

## 2016-08-17 MED ORDER — ACD FORMULA A 0.73-2.45-2.2 GM/100ML VI SOLN
500.0000 mL | Status: DC
  Administered 2016-08-17: 500 mL via INTRAVENOUS

## 2016-08-17 MED ORDER — DIPHENHYDRAMINE HCL 25 MG PO CAPS: 25.0000 mg | ORAL_CAPSULE | Freq: Four times a day (QID) | ORAL | Status: DC | PRN

## 2016-08-17 MED ORDER — CALCIUM CARBONATE ANTACID 500 MG PO CHEW
2.0000 | CHEWABLE_TABLET | ORAL | Status: AC
  Administered 2016-08-17 (×2): 400 mg via ORAL

## 2016-08-17 MED ORDER — ACETAMINOPHEN 325 MG PO TABS: 650.0000 mg | ORAL_TABLET | ORAL | Status: DC | PRN

## 2016-08-17 MED ORDER — ANTICOAGULANT SODIUM CITRATE 4% (200MG/5ML) IV SOLN: 5.0000 mL | Freq: Once | Status: DC

## 2016-08-17 MED ORDER — ANTICOAGULANT SODIUM CITRATE 4% (200MG/5ML) IV SOLN
5.0000 mL | Freq: Once | Status: AC
  Administered 2016-08-17: 5 mL
  Filled 2016-08-17: qty 5

## 2016-08-17 MED ORDER — CALCIUM CARBONATE ANTACID 500 MG PO CHEW: 2.0000 | CHEWABLE_TABLET | ORAL | Status: DC

## 2016-08-17 MED ORDER — CALCIUM GLUCONATE 10 % IV SOLN
2.0000 g | Freq: Once | INTRAVENOUS | Status: AC
  Administered 2016-08-17: 2 g via INTRAVENOUS
  Filled 2016-08-17: qty 20

## 2016-08-17 NOTE — Consult Note (Signed)
Tolerated treatment with no issues, Discharged post Pheresis with vss to spouse

## 2016-08-18 LAB — THERAPEUTIC PLASMA EXCHANGE (BLOOD BANK)
PLASMA VOLUME NEEDED: 3327
UNIT DIVISION: 0
UNIT DIVISION: 0
UNIT DIVISION: 0
UNIT DIVISION: 0
UNIT DIVISION: 0
UNIT DIVISION: 0
Unit division: 0
Unit division: 0
Unit division: 0
Unit division: 0

## 2016-08-20 ENCOUNTER — Other Ambulatory Visit (HOSPITAL_BASED_OUTPATIENT_CLINIC_OR_DEPARTMENT_OTHER): Payer: BC Managed Care – PPO

## 2016-08-20 ENCOUNTER — Telehealth: Payer: Self-pay | Admitting: Nurse Practitioner

## 2016-08-20 ENCOUNTER — Ambulatory Visit (HOSPITAL_BASED_OUTPATIENT_CLINIC_OR_DEPARTMENT_OTHER): Payer: BC Managed Care – PPO | Admitting: Nurse Practitioner

## 2016-08-20 VITALS — BP 132/72 | HR 77 | Temp 97.9°F | Resp 18 | Ht 67.0 in | Wt 194.5 lb

## 2016-08-20 DIAGNOSIS — C9000 Multiple myeloma not having achieved remission: Secondary | ICD-10-CM | POA: Diagnosis not present

## 2016-08-20 DIAGNOSIS — M3119 Other thrombotic microangiopathy: Secondary | ICD-10-CM

## 2016-08-20 DIAGNOSIS — M311 Thrombotic microangiopathy: Secondary | ICD-10-CM

## 2016-08-20 LAB — CBC WITH DIFFERENTIAL/PLATELET
BASO%: 0 % (ref 0.0–2.0)
Basophils Absolute: 0 10*3/uL (ref 0.0–0.1)
EOS%: 0 % (ref 0.0–7.0)
Eosinophils Absolute: 0 10*3/uL (ref 0.0–0.5)
HCT: 32.2 % — ABNORMAL LOW (ref 34.8–46.6)
HGB: 10.4 g/dL — ABNORMAL LOW (ref 11.6–15.9)
LYMPH%: 16.6 % (ref 14.0–49.7)
MCH: 30 pg (ref 25.1–34.0)
MCHC: 32.3 g/dL (ref 31.5–36.0)
MCV: 92.8 fL (ref 79.5–101.0)
MONO#: 0.2 10*3/uL (ref 0.1–0.9)
MONO%: 1.7 % (ref 0.0–14.0)
NEUT#: 7.4 10*3/uL — ABNORMAL HIGH (ref 1.5–6.5)
NEUT%: 81.7 % — AB (ref 38.4–76.8)
Platelets: 151 10*3/uL (ref 145–400)
RBC: 3.47 10*6/uL — AB (ref 3.70–5.45)
RDW: 18.1 % — AB (ref 11.2–14.5)
WBC: 9.1 10*3/uL (ref 3.9–10.3)
lymph#: 1.5 10*3/uL (ref 0.9–3.3)

## 2016-08-20 LAB — COMPREHENSIVE METABOLIC PANEL
ALBUMIN: 3.7 g/dL (ref 3.5–5.0)
ALK PHOS: 63 U/L (ref 40–150)
ALT: 22 U/L (ref 0–55)
AST: 17 U/L (ref 5–34)
Anion Gap: 10 mEq/L (ref 3–11)
BUN: 24.9 mg/dL (ref 7.0–26.0)
CHLORIDE: 102 meq/L (ref 98–109)
CO2: 25 meq/L (ref 22–29)
Calcium: 11.4 mg/dL — ABNORMAL HIGH (ref 8.4–10.4)
Creatinine: 1.3 mg/dL — ABNORMAL HIGH (ref 0.6–1.1)
EGFR: 52 mL/min/{1.73_m2} — ABNORMAL LOW (ref >90)
GLUCOSE: 149 mg/dL — AB (ref 70–140)
POTASSIUM: 4.7 meq/L (ref 3.5–5.1)
SODIUM: 137 meq/L (ref 136–145)
Total Bilirubin: 0.74 mg/dL (ref 0.20–1.20)
Total Protein: 6.9 g/dL (ref 6.4–8.3)

## 2016-08-20 LAB — LACTATE DEHYDROGENASE: LDH: 271 U/L — ABNORMAL HIGH (ref 125–245)

## 2016-08-20 NOTE — Progress Notes (Addendum)
[place] OFFICE PROGRESS NOTE   Diagnosis:  TTP  INTERVAL HISTORY:   Carrie Huber returns as scheduled. She had plasmapheresis on 08/15/2016 and 08/17/2016. She is feeling better. No bleeding. No unusual headaches. No visual disturbance. No fever.  Objective:  Vital signs in last 24 hours:  Blood pressure 132/72, pulse 77, temperature 97.9 F (36.6 C), temperature source Oral, resp. rate 18, height _0  (1.702 m), weight 194 lb 8 oz (88.2 kg), SpO2 100 %.    HEENT: No thrush or ulcers. Resp: Lungs clear bilaterally. Cardio: Regular rate and rhythm. GI: Abdomen soft and nontender. No organomegaly. Vascular: No leg edema. Left lower leg is larger than the right lower leg. Calves soft and nontender. Neuro: Alert and oriented. Gait normal.  Skin: Resolving ecchymosis left pretibial region. Pheresis catheter right upper chest.    Lab Results:  Lab Results  Component Value Date   WBC 9.1 08/20/2016   HGB 10.4 (L) 08/20/2016   HCT 32.2 (L) 08/20/2016   MCV 92.8 08/20/2016   PLT 151 08/20/2016   NEUTROABS 7.4 (H) 08/20/2016    Imaging:  No results found.  Medications: I have reviewed the patient's current medications.  Assessment/Plan: 1. Multiple myeloma, IgG kappa, status post 7 cycles of Revlimid/Decadron and Velcade with marked clinical and laboratory improvement. She is status post melphalan conditioning, followed by an autologous stem cell infusion 09/10/2008. She began maintenance Revlimid 01/04/2009. She has persistent mild elevation of the kappa and lambda light chains.  Revlimid placed on hold at time of TTP diagnosis  08/04/2016 SPEP with no M spike observed; stable mild elevation of And lambda light chains 2. Neutropenia while on Revlimid at a dose of 10 mg daily. The Revlimid dose was reduced to 5 mg in September 2011 due to persistent neutropenia and a hospital admission with pneumonia/sepsis. neutropenia persists. 3. Admission 11/03/2009  with left lung pneumonia and sepsis syndrome. No specific organism was cultured during the hospital admission. She completed outpatient Levaquin therapy.  4. 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  5. Thoracic spine compression fracture, status post a kyphoplasty 12/05/2007. A metastatic bone survey was unchanged on04/19/2018. 6. Tiny pulmonary embolism on a CT 11/20/2007, now maintained off of Coumadin.  7. Chronic tachycardia on repeat office visits at the [facility].  8. History of hypertension. 9. 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  Prednisone started 08/07/2016   Prednisone taper to 40 mg daily beginning 08/14/2016  Plasmapheresis 08/15/2016  Plasmapheresis 08/17/2016  Prednisone taper to 30 mg daily beginning 08/21/2016  10. Altered mental status-most likely secondary to TTP, resolved  11. Proteinuria on 24-hour urine 08/04/2016 (2.3 grams). Seen by Dr. Lorrene Reid during hospitalization June 2018. Renal ultrasound 08/09/2016 with no acute abnormality seen. Outpatient follow-up with Dr. Lorrene Reid planned.    Disposition: Ms. Seto appears stable. Last plasmapheresis was 08/17/2016. The platelet count is stable. Hemoglobin has improved. Dr. Benay Spice recommends stopping plasmapheresis at this point with continued close laboratory follow-up. She will taper prednisone to 30 mg daily. She will return for a follow-up CBC on 08/24/2016. If lab parameters remain stable the plan is to have the pheresis catheter removed on 08/24/2016.  Calcium level is elevated on labs today. She will discontinue oral calcium.  She will return  for labs and a follow-up visit on  09/03/2016. She will contact the office in the interim with any problems.  Patient seen with Dr. Benay Spice.   Ned Card ANP/GNP-BC   08/20/2016  3:39 PM  This was a shared visit with Ned Card. There is no evidence of ongoing hemolysis. She will continue a prednisone taper. She will return for a lab visit on 08/24/2016. We will arrange for removal of the pheresis catheter if the CBC is stable 08/24/2016.  [place].

## 2016-08-20 NOTE — Patient Instructions (Signed)
Decrease Prednisone to 30 mg daily Discontinue calcium

## 2016-08-20 NOTE — Telephone Encounter (Signed)
Scheduled appt per 6/18 los - Gave patient AVS and calender per 6/18 los.

## 2016-08-21 ENCOUNTER — Other Ambulatory Visit: Payer: Self-pay | Admitting: Nurse Practitioner

## 2016-08-21 ENCOUNTER — Telehealth: Payer: Self-pay | Admitting: *Deleted

## 2016-08-21 DIAGNOSIS — M311 Thrombotic microangiopathy: Secondary | ICD-10-CM

## 2016-08-21 DIAGNOSIS — M3119 Other thrombotic microangiopathy: Secondary | ICD-10-CM

## 2016-08-21 NOTE — Telephone Encounter (Signed)
-----   Message from Owens Shark, NP sent at 08/21/2016  8:53 AM EDT ----- Please let her know the LDH was elevated on labs yesterday. We will repeat when she returns on Friday.

## 2016-08-21 NOTE — Telephone Encounter (Signed)
Telephone call to patient. Advised lab results as directed below. Patient verbalized an understanding.

## 2016-08-24 ENCOUNTER — Other Ambulatory Visit: Payer: BC Managed Care – PPO

## 2016-08-24 ENCOUNTER — Telehealth: Payer: Self-pay

## 2016-08-24 NOTE — Telephone Encounter (Signed)
Call placed to verify with patient that we are aware that she changed her lab appointment to 6/25. Patient appreciative of call back.

## 2016-08-27 ENCOUNTER — Other Ambulatory Visit (HOSPITAL_BASED_OUTPATIENT_CLINIC_OR_DEPARTMENT_OTHER): Payer: BC Managed Care – PPO

## 2016-08-27 ENCOUNTER — Telehealth: Payer: Self-pay | Admitting: *Deleted

## 2016-08-27 DIAGNOSIS — C9000 Multiple myeloma not having achieved remission: Secondary | ICD-10-CM | POA: Diagnosis not present

## 2016-08-27 DIAGNOSIS — M3119 Other thrombotic microangiopathy: Secondary | ICD-10-CM

## 2016-08-27 DIAGNOSIS — M311 Thrombotic microangiopathy: Secondary | ICD-10-CM

## 2016-08-27 LAB — CBC WITH DIFFERENTIAL/PLATELET
BASO%: 0.2 % (ref 0.0–2.0)
Basophils Absolute: 0 10*3/uL (ref 0.0–0.1)
EOS%: 1 % (ref 0.0–7.0)
Eosinophils Absolute: 0.1 10*3/uL (ref 0.0–0.5)
HCT: 33 % — ABNORMAL LOW (ref 34.8–46.6)
HEMOGLOBIN: 10.7 g/dL — AB (ref 11.6–15.9)
LYMPH#: 3.5 10*3/uL — AB (ref 0.9–3.3)
LYMPH%: 43.6 % (ref 14.0–49.7)
MCH: 30.1 pg (ref 25.1–34.0)
MCHC: 32.4 g/dL (ref 31.5–36.0)
MCV: 92.7 fL (ref 79.5–101.0)
MONO#: 0.4 10*3/uL (ref 0.1–0.9)
MONO%: 5.4 % (ref 0.0–14.0)
NEUT%: 49.8 % (ref 38.4–76.8)
NEUTROS ABS: 4 10*3/uL (ref 1.5–6.5)
NRBC: 0 % (ref 0–0)
Platelets: 112 10*3/uL — ABNORMAL LOW (ref 145–400)
RBC: 3.56 10*6/uL — ABNORMAL LOW (ref 3.70–5.45)
RDW: 18.3 % — AB (ref 11.2–14.5)
WBC: 8 10*3/uL (ref 3.9–10.3)

## 2016-08-27 LAB — COMPREHENSIVE METABOLIC PANEL
ALBUMIN: 3.3 g/dL — AB (ref 3.5–5.0)
ALK PHOS: 57 U/L (ref 40–150)
ALT: 27 U/L (ref 0–55)
AST: 18 U/L (ref 5–34)
Anion Gap: 11 mEq/L (ref 3–11)
BUN: 22.6 mg/dL (ref 7.0–26.0)
CALCIUM: 9.6 mg/dL (ref 8.4–10.4)
CO2: 22 mEq/L (ref 22–29)
CREATININE: 1.3 mg/dL — AB (ref 0.6–1.1)
Chloride: 107 mEq/L (ref 98–109)
EGFR: 53 mL/min/{1.73_m2} — ABNORMAL LOW (ref >90)
GLUCOSE: 132 mg/dL (ref 70–140)
POTASSIUM: 3.6 meq/L (ref 3.5–5.1)
SODIUM: 140 meq/L (ref 136–145)
Total Bilirubin: 0.71 mg/dL (ref 0.20–1.20)
Total Protein: 6.7 g/dL (ref 6.4–8.3)

## 2016-08-27 LAB — LACTATE DEHYDROGENASE: LDH: 271 U/L — AB (ref 125–245)

## 2016-08-27 NOTE — Telephone Encounter (Signed)
Left message for return call to discuss plan with patient.  Patient will need to keep pheresis cath in place for now. She will need to return to [facility] for lab on Wednesday- appointment scheduled for 9am. She will need to have her catheter flushed and dressing changed as discussed with her earlier today.   Patient was still in the lobby at time of call- Discussed plan with patient. She agrees. She is going over to the dialysis center to have dressing changed and line flushed. She will return Wednesday for lab and will wait for scheduling to call with an appointment for Friday.

## 2016-08-28 ENCOUNTER — Other Ambulatory Visit: Payer: Self-pay | Admitting: *Deleted

## 2016-08-28 DIAGNOSIS — D696 Thrombocytopenia, unspecified: Secondary | ICD-10-CM

## 2016-08-29 ENCOUNTER — Other Ambulatory Visit (HOSPITAL_BASED_OUTPATIENT_CLINIC_OR_DEPARTMENT_OTHER): Payer: BC Managed Care – PPO

## 2016-08-29 ENCOUNTER — Ambulatory Visit (HOSPITAL_BASED_OUTPATIENT_CLINIC_OR_DEPARTMENT_OTHER): Payer: BC Managed Care – PPO

## 2016-08-29 VITALS — BP 133/70 | HR 64 | Temp 98.4°F | Resp 18

## 2016-08-29 DIAGNOSIS — M311 Thrombotic microangiopathy: Secondary | ICD-10-CM | POA: Diagnosis not present

## 2016-08-29 DIAGNOSIS — C9000 Multiple myeloma not having achieved remission: Secondary | ICD-10-CM

## 2016-08-29 DIAGNOSIS — Z452 Encounter for adjustment and management of vascular access device: Secondary | ICD-10-CM

## 2016-08-29 DIAGNOSIS — Z4901 Encounter for fitting and adjustment of extracorporeal dialysis catheter: Secondary | ICD-10-CM

## 2016-08-29 DIAGNOSIS — D696 Thrombocytopenia, unspecified: Secondary | ICD-10-CM

## 2016-08-29 LAB — CBC WITH DIFFERENTIAL/PLATELET
BASO%: 0.3 % (ref 0.0–2.0)
BASOS ABS: 0 10*3/uL (ref 0.0–0.1)
EOS ABS: 0.1 10*3/uL (ref 0.0–0.5)
EOS%: 0.7 % (ref 0.0–7.0)
HCT: 31.9 % — ABNORMAL LOW (ref 34.8–46.6)
HEMOGLOBIN: 10.7 g/dL — AB (ref 11.6–15.9)
LYMPH%: 21.5 % (ref 14.0–49.7)
MCH: 30.7 pg (ref 25.1–34.0)
MCHC: 33.5 g/dL (ref 31.5–36.0)
MCV: 91.7 fL (ref 79.5–101.0)
MONO#: 0.4 10*3/uL (ref 0.1–0.9)
MONO%: 4.8 % (ref 0.0–14.0)
NEUT#: 6.8 10*3/uL — ABNORMAL HIGH (ref 1.5–6.5)
NEUT%: 72.7 % (ref 38.4–76.8)
Platelets: 117 10*3/uL — ABNORMAL LOW (ref 145–400)
RBC: 3.47 10*6/uL — ABNORMAL LOW (ref 3.70–5.45)
RDW: 19.7 % — AB (ref 11.2–14.5)
WBC: 9.3 10*3/uL (ref 3.9–10.3)
lymph#: 2 10*3/uL (ref 0.9–3.3)

## 2016-08-29 LAB — COMPREHENSIVE METABOLIC PANEL
ALBUMIN: 3.1 g/dL — AB (ref 3.5–5.0)
ALK PHOS: 58 U/L (ref 40–150)
ALT: 29 U/L (ref 0–55)
AST: 19 U/L (ref 5–34)
Anion Gap: 9 mEq/L (ref 3–11)
BUN: 20.2 mg/dL (ref 7.0–26.0)
CALCIUM: 9.3 mg/dL (ref 8.4–10.4)
CHLORIDE: 106 meq/L (ref 98–109)
CO2: 24 mEq/L (ref 22–29)
Creatinine: 1.1 mg/dL (ref 0.6–1.1)
EGFR: 60 mL/min/{1.73_m2} — AB (ref >90)
Glucose: 133 mg/dl (ref 70–140)
POTASSIUM: 3.7 meq/L (ref 3.5–5.1)
SODIUM: 139 meq/L (ref 136–145)
Total Bilirubin: 0.59 mg/dL (ref 0.20–1.20)
Total Protein: 6.3 g/dL — ABNORMAL LOW (ref 6.4–8.3)

## 2016-08-29 LAB — LACTATE DEHYDROGENASE: LDH: 255 U/L — AB (ref 125–245)

## 2016-08-29 MED ORDER — SODIUM CHLORIDE 0.9% FLUSH
10.0000 mL | INTRAVENOUS | Status: AC | PRN
  Administered 2016-08-29: 10 mL
  Filled 2016-08-29: qty 10

## 2016-08-29 MED ORDER — SODIUM CHLORIDE 0.9% FLUSH
10.0000 mL | INTRAVENOUS | Status: DC | PRN
  Filled 2016-08-29: qty 10

## 2016-08-29 MED ORDER — SODIUM CHLORIDE 0.9 % IJ SOLN
10.0000 mL | INTRAMUSCULAR | Status: DC | PRN
  Filled 2016-08-29: qty 10

## 2016-08-29 MED ORDER — ANTICOAGULANT SODIUM CITRATE 4% (200MG/5ML) IV SOLN
5.0000 mL | Status: DC | PRN
  Filled 2016-08-29: qty 5

## 2016-08-30 ENCOUNTER — Other Ambulatory Visit: Payer: Self-pay | Admitting: *Deleted

## 2016-08-30 DIAGNOSIS — D696 Thrombocytopenia, unspecified: Secondary | ICD-10-CM

## 2016-08-31 ENCOUNTER — Other Ambulatory Visit (HOSPITAL_BASED_OUTPATIENT_CLINIC_OR_DEPARTMENT_OTHER): Payer: BC Managed Care – PPO

## 2016-08-31 ENCOUNTER — Telehealth: Payer: Self-pay | Admitting: *Deleted

## 2016-08-31 ENCOUNTER — Other Ambulatory Visit: Payer: Self-pay | Admitting: Nurse Practitioner

## 2016-08-31 DIAGNOSIS — C9000 Multiple myeloma not having achieved remission: Secondary | ICD-10-CM | POA: Diagnosis not present

## 2016-08-31 DIAGNOSIS — M311 Thrombotic microangiopathy: Secondary | ICD-10-CM

## 2016-08-31 DIAGNOSIS — D696 Thrombocytopenia, unspecified: Secondary | ICD-10-CM

## 2016-08-31 DIAGNOSIS — M3119 Other thrombotic microangiopathy: Secondary | ICD-10-CM

## 2016-08-31 LAB — CBC WITH DIFFERENTIAL/PLATELET
BASO%: 0.6 % (ref 0.0–2.0)
Basophils Absolute: 0 10*3/uL (ref 0.0–0.1)
EOS ABS: 0 10*3/uL (ref 0.0–0.5)
EOS%: 0.5 % (ref 0.0–7.0)
HCT: 30.5 % — ABNORMAL LOW (ref 34.8–46.6)
HEMOGLOBIN: 10.1 g/dL — AB (ref 11.6–15.9)
LYMPH%: 17.5 % (ref 14.0–49.7)
MCH: 30.5 pg (ref 25.1–34.0)
MCHC: 33 g/dL (ref 31.5–36.0)
MCV: 92.5 fL (ref 79.5–101.0)
MONO#: 0.3 10*3/uL (ref 0.1–0.9)
MONO%: 4 % (ref 0.0–14.0)
NEUT%: 77.4 % — ABNORMAL HIGH (ref 38.4–76.8)
NEUTROS ABS: 6.4 10*3/uL (ref 1.5–6.5)
PLATELETS: 107 10*3/uL — AB (ref 145–400)
RBC: 3.3 10*6/uL — ABNORMAL LOW (ref 3.70–5.45)
RDW: 19.8 % — AB (ref 11.2–14.5)
WBC: 8.3 10*3/uL (ref 3.9–10.3)
lymph#: 1.5 10*3/uL (ref 0.9–3.3)

## 2016-08-31 LAB — COMPREHENSIVE METABOLIC PANEL
ALBUMIN: 3 g/dL — AB (ref 3.5–5.0)
ALK PHOS: 57 U/L (ref 40–150)
ALT: 26 U/L (ref 0–55)
AST: 15 U/L (ref 5–34)
Anion Gap: 9 mEq/L (ref 3–11)
BILIRUBIN TOTAL: 0.51 mg/dL (ref 0.20–1.20)
BUN: 25 mg/dL (ref 7.0–26.0)
CO2: 24 mEq/L (ref 22–29)
CREATININE: 1.3 mg/dL — AB (ref 0.6–1.1)
Calcium: 9.7 mg/dL (ref 8.4–10.4)
Chloride: 108 mEq/L (ref 98–109)
EGFR: 53 mL/min/{1.73_m2} — AB (ref >90)
GLUCOSE: 149 mg/dL — AB (ref 70–140)
Potassium: 4 mEq/L (ref 3.5–5.1)
SODIUM: 141 meq/L (ref 136–145)
TOTAL PROTEIN: 6.1 g/dL — AB (ref 6.4–8.3)

## 2016-08-31 LAB — LACTATE DEHYDROGENASE: LDH: 260 U/L — ABNORMAL HIGH (ref 125–245)

## 2016-08-31 NOTE — Telephone Encounter (Signed)
Labs reviewed by Dr. Benay Spice: Stable, follow up Monday as scheduled. Pt voiced understanding. Pt denies fatigue or confusion. Feels well. Stated she will go to ED over the weekend if she develops profound fatigue similar to last hospitalization.

## 2016-09-03 ENCOUNTER — Telehealth: Payer: Self-pay | Admitting: Oncology

## 2016-09-03 ENCOUNTER — Other Ambulatory Visit: Payer: Self-pay | Admitting: Oncology

## 2016-09-03 ENCOUNTER — Ambulatory Visit (HOSPITAL_BASED_OUTPATIENT_CLINIC_OR_DEPARTMENT_OTHER): Payer: BC Managed Care – PPO | Admitting: Oncology

## 2016-09-03 ENCOUNTER — Encounter (HOSPITAL_COMMUNITY): Payer: Self-pay | Admitting: Radiology

## 2016-09-03 ENCOUNTER — Ambulatory Visit (HOSPITAL_COMMUNITY)
Admission: RE | Admit: 2016-09-03 | Discharge: 2016-09-03 | Disposition: A | Discharge status: Home / Self Care | Payer: BC Managed Care – PPO | Source: Ambulatory Visit | Attending: Oncology | Admitting: Oncology

## 2016-09-03 ENCOUNTER — Other Ambulatory Visit (HOSPITAL_BASED_OUTPATIENT_CLINIC_OR_DEPARTMENT_OTHER): Payer: BC Managed Care – PPO

## 2016-09-03 VITALS — BP 143/81 | HR 80 | Temp 98.9°F | Resp 18 | Ht 67.0 in | Wt 196.7 lb

## 2016-09-03 DIAGNOSIS — M311 Thrombotic microangiopathy: Secondary | ICD-10-CM | POA: Diagnosis not present

## 2016-09-03 DIAGNOSIS — D649 Anemia, unspecified: Secondary | ICD-10-CM | POA: Diagnosis not present

## 2016-09-03 DIAGNOSIS — D696 Thrombocytopenia, unspecified: Secondary | ICD-10-CM

## 2016-09-03 DIAGNOSIS — C9000 Multiple myeloma not having achieved remission: Secondary | ICD-10-CM | POA: Diagnosis not present

## 2016-09-03 DIAGNOSIS — Z452 Encounter for adjustment and management of vascular access device: Secondary | ICD-10-CM | POA: Diagnosis present

## 2016-09-03 DIAGNOSIS — M3119 Other thrombotic microangiopathy: Secondary | ICD-10-CM

## 2016-09-03 HISTORY — PX: IR RADIOLOGIST EVAL & MGMT: IMG5224

## 2016-09-03 LAB — CBC WITH DIFFERENTIAL/PLATELET
BASO%: 0.5 % (ref 0.0–2.0)
BASOS ABS: 0 10*3/uL (ref 0.0–0.1)
EOS ABS: 0 10*3/uL (ref 0.0–0.5)
EOS%: 0.5 % (ref 0.0–7.0)
HEMATOCRIT: 31.2 % — AB (ref 34.8–46.6)
HEMOGLOBIN: 10.4 g/dL — AB (ref 11.6–15.9)
LYMPH#: 1.4 10*3/uL (ref 0.9–3.3)
LYMPH%: 16.1 % (ref 14.0–49.7)
MCH: 30.8 pg (ref 25.1–34.0)
MCHC: 33.4 g/dL (ref 31.5–36.0)
MCV: 92.2 fL (ref 79.5–101.0)
MONO#: 0.3 10*3/uL (ref 0.1–0.9)
MONO%: 3.8 % (ref 0.0–14.0)
NEUT#: 7.1 10*3/uL — ABNORMAL HIGH (ref 1.5–6.5)
NEUT%: 79.1 % — AB (ref 38.4–76.8)
Platelets: 117 10*3/uL — ABNORMAL LOW (ref 145–400)
RBC: 3.38 10*6/uL — ABNORMAL LOW (ref 3.70–5.45)
RDW: 19.5 % — AB (ref 11.2–14.5)
WBC: 9 10*3/uL (ref 3.9–10.3)

## 2016-09-03 LAB — COMPREHENSIVE METABOLIC PANEL
ALK PHOS: 54 U/L (ref 40–150)
ALT: 23 U/L (ref 0–55)
AST: 14 U/L (ref 5–34)
Albumin: 3 g/dL — ABNORMAL LOW (ref 3.5–5.0)
Anion Gap: 11 mEq/L (ref 3–11)
BUN: 22.8 mg/dL (ref 7.0–26.0)
CALCIUM: 9.7 mg/dL (ref 8.4–10.4)
CO2: 23 mEq/L (ref 22–29)
Chloride: 108 mEq/L (ref 98–109)
Creatinine: 1.3 mg/dL — ABNORMAL HIGH (ref 0.6–1.1)
EGFR: 50 mL/min/{1.73_m2} — AB (ref >90)
Glucose: 141 mg/dl — ABNORMAL HIGH (ref 70–140)
POTASSIUM: 4 meq/L (ref 3.5–5.1)
Sodium: 142 mEq/L (ref 136–145)
Total Bilirubin: 0.55 mg/dL (ref 0.20–1.20)
Total Protein: 6.3 g/dL — ABNORMAL LOW (ref 6.4–8.3)

## 2016-09-03 LAB — LACTATE DEHYDROGENASE: LDH: 251 U/L — AB (ref 125–245)

## 2016-09-03 NOTE — Telephone Encounter (Signed)
9:45 a.m. On 09/11/16, per [facility] nurse as per lab has a meeting on 09/11/16. Appointments scheduled per 09/03/16 los. Patient was given a copy of the AVS report and appointment schedule, per 09/03/16 los.

## 2016-09-03 NOTE — Progress Notes (Signed)
[place] OFFICE PROGRESS NOTE   Diagnosis: Multiple myeloma, TTP  INTERVAL HISTORY:   Carrie Huber returns as scheduled. She feels well. No neurologic symptoms. No complaint other than stress surrounding her brother's illness.  Objective:  Vital signs in last 24 hours:  Blood pressure (!) 143/81, pulse 80, temperature 98.9 F (37.2 C), temperature source Oral, resp. rate 18, height '5\' 7"'  (1.702 m), weight 196 lb 11.2 oz (89.2 kg), SpO2 100 %.    HEENT: No thrush Resp: Lungs clear bilaterally Cardio: Regular rate and rhythm GI: No hepatomegaly Vascular: No leg edema Neuro: Alert and oriented, the speech is fluent     Right neck pheresis catheter site without evidence of infection  Lab Results:  Lab Results  Component Value Date   WBC 9.0 09/03/2016   HGB 10.4 (L) 09/03/2016   HCT 31.2 (L) 09/03/2016   MCV 92.2 09/03/2016   PLT 117 (L) 09/03/2016   NEUTROABS 7.1 (H) 09/03/2016    CMP     Component Value Date/Time   NA 141 08/31/2016 0942   K 4.0 08/31/2016 0942   CL 104 08/17/2016 0830   CL 110 (H) 06/12/2012 0842   CO2 24 08/31/2016 0942   GLUCOSE 149 (H) 08/31/2016 0942   GLUCOSE 94 06/12/2012 0842   BUN 25.0 08/31/2016 0942   CREATININE 1.3 (H) 08/31/2016 0942   CALCIUM 9.7 08/31/2016 0942   PROT 6.1 (L) 08/31/2016 0942   ALBUMIN 3.0 (L) 08/31/2016 0942   AST 15 08/31/2016 0942   ALT 26 08/31/2016 0942   ALKPHOS 57 08/31/2016 0942   BILITOT 0.51 08/31/2016 0942   GFRNONAA 38 (L) 08/17/2016 0830   GFRAA 44 (L) 08/17/2016 0830   LDH-251  Medications: I have reviewed the patient's current medications.  Assessment/Plan: 1. Multiple myeloma, IgG kappa, status post 7 cycles of Revlimid/Decadron and Velcade with marked clinical and laboratory improvement. She is status post melphalan conditioning, followed by an autologous stem cell infusion 09/10/2008. She began maintenance Revlimid 01/04/2009. She has persistent mild elevation of the  kappa and lambda light chains.  Revlimid placed on hold at time of TTP diagnosis  08/04/2016 SPEP with no M spike observed; stable mild elevation of And lambda light chains 2. Neutropenia while on Revlimid at a dose of 10 mg daily. The Revlimid dose was reduced to 5 mg in September 2011 due to persistent neutropenia and a hospital admission with pneumonia/sepsis. neutropenia persists. 3. Admission 11/03/2009 with left lung pneumonia and sepsis syndrome. No specific organism was cultured during the hospital admission. She completed outpatient Levaquin therapy.  4. 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  5. Thoracic spine compression fracture, status post a kyphoplasty 12/05/2007. A metastatic bone survey was unchanged on04/19/2018. 6. Tiny pulmonary embolism on a CT 11/20/2007, now maintained off of Coumadin.  7. Chronic tachycardia on repeat office visits at the [facility]  8. History of hypertension. 9. 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  Prednisone started 08/07/2016   Prednisone taper to 40 mg daily beginning 08/14/2016  Plasmapheresis 08/15/2016  Plasmapheresis 08/17/2016  Prednisone taper to 30 mg daily beginning 08/21/2016  Prednisone tapered to 20 mg daily beginning 09/03/2016  10. Altered mental status-most likely secondary to TTP,resolved  11. Proteinuria on 24-hour urine 08/04/2016 (  2.3 grams).Seen by Dr. Lorrene Reid during hospitalization June 2018.Renal ultrasound 08/09/2016 with no acute abnormality seen. Outpatient follow-up with Dr. Lorrene Reid planned.     Disposition:  She appears well. She continues to have mild  anemia/thrombocytopenia and a very mildly elevated LDH. The creatinine is mildly elevated and stable. She does not appear to have significant hemolysis at present. I will follow-up on the peripheral blood smear and reticulocyte count today.  The plan is to add rituximab if the platelet count does not recover and there is ongoing evidence of hemolysis. We will continue to follow the laboratory parameters of hemolysis closely. I reviewed the potential toxicities associated with rituximab including the chance of an allergic reaction, hematologic toxicity, CNS toxicity, and reactivation of hepatitis. She agrees to proceed with rituximab if needed.  Carrie Huber will return for a lab visit on 09/07/2016. She will be scheduled for an office and lab visit on 09/11/2016. The apheresis catheter will be removed today. She is scheduled for a follow-up appointment with nephrology for evaluation of the proteinuria and mild elevation of the creatinine.  25 minutes were spent with the patient today. The majority of the time was used for counseling and coordination of care.  Donneta Romberg, MD  09/03/2016  8:58 AM

## 2016-09-03 NOTE — Progress Notes (Signed)
Patient ID: Carrie Huber, female   DOB: 1950/11/26, 66 y.o.   MRN: [id number] Patient's non-tunneled right internal jugular central venous catheter removed in its entirety without immediate complications. Vaseline gauze dressing applied over site.

## 2016-09-04 LAB — ADAMTS13 ACTIVITY

## 2016-09-07 ENCOUNTER — Other Ambulatory Visit (HOSPITAL_BASED_OUTPATIENT_CLINIC_OR_DEPARTMENT_OTHER): Payer: BC Managed Care – PPO

## 2016-09-07 ENCOUNTER — Other Ambulatory Visit: Payer: Self-pay | Admitting: *Deleted

## 2016-09-07 DIAGNOSIS — D649 Anemia, unspecified: Secondary | ICD-10-CM

## 2016-09-07 DIAGNOSIS — M311 Thrombotic microangiopathy: Secondary | ICD-10-CM | POA: Diagnosis not present

## 2016-09-07 DIAGNOSIS — D696 Thrombocytopenia, unspecified: Secondary | ICD-10-CM

## 2016-09-07 DIAGNOSIS — C9000 Multiple myeloma not having achieved remission: Secondary | ICD-10-CM

## 2016-09-07 LAB — COMPREHENSIVE METABOLIC PANEL
ALT: 18 U/L (ref 0–55)
ANION GAP: 8 meq/L (ref 3–11)
AST: 15 U/L (ref 5–34)
Albumin: 2.8 g/dL — ABNORMAL LOW (ref 3.5–5.0)
Alkaline Phosphatase: 49 U/L (ref 40–150)
BILIRUBIN TOTAL: 0.47 mg/dL (ref 0.20–1.20)
BUN: 25 mg/dL (ref 7.0–26.0)
CHLORIDE: 108 meq/L (ref 98–109)
CO2: 26 mEq/L (ref 22–29)
Calcium: 9.3 mg/dL (ref 8.4–10.4)
Creatinine: 1.2 mg/dL — ABNORMAL HIGH (ref 0.6–1.1)
EGFR: 57 mL/min/{1.73_m2} — AB (ref >90)
GLUCOSE: 139 mg/dL (ref 70–140)
POTASSIUM: 3.6 meq/L (ref 3.5–5.1)
SODIUM: 142 meq/L (ref 136–145)
TOTAL PROTEIN: 6.1 g/dL — AB (ref 6.4–8.3)

## 2016-09-07 LAB — CBC & DIFF AND RETIC
BASO%: 0.1 % (ref 0.0–2.0)
BASOS ABS: 0 10*3/uL (ref 0.0–0.1)
EOS ABS: 0 10*3/uL (ref 0.0–0.5)
EOS%: 0.4 % (ref 0.0–7.0)
HEMATOCRIT: 30.4 % — AB (ref 34.8–46.6)
HEMOGLOBIN: 9.7 g/dL — AB (ref 11.6–15.9)
Immature Retic Fract: 14.5 % — ABNORMAL HIGH (ref 1.60–10.00)
LYMPH#: 1.6 10*3/uL (ref 0.9–3.3)
LYMPH%: 22.7 % (ref 14.0–49.7)
MCH: 30 pg (ref 25.1–34.0)
MCHC: 31.9 g/dL (ref 31.5–36.0)
MCV: 94.1 fL (ref 79.5–101.0)
MONO#: 0.3 10*3/uL (ref 0.1–0.9)
MONO%: 4.5 % (ref 0.0–14.0)
NEUT%: 72.3 % (ref 38.4–76.8)
NEUTROS ABS: 5 10*3/uL (ref 1.5–6.5)
Platelets: 131 10*3/uL — ABNORMAL LOW (ref 145–400)
RBC: 3.23 10*6/uL — ABNORMAL LOW (ref 3.70–5.45)
RDW: 18 % — ABNORMAL HIGH (ref 11.2–14.5)
RETIC CT ABS: 66.22 10*3/uL (ref 33.70–90.70)
Retic %: 2.05 % (ref 0.70–2.10)
WBC: 7 10*3/uL (ref 3.9–10.3)

## 2016-09-07 LAB — LACTATE DEHYDROGENASE: LDH: 202 U/L (ref 125–245)

## 2016-09-11 ENCOUNTER — Ambulatory Visit (HOSPITAL_BASED_OUTPATIENT_CLINIC_OR_DEPARTMENT_OTHER): Payer: BC Managed Care – PPO | Admitting: Oncology

## 2016-09-11 ENCOUNTER — Other Ambulatory Visit (HOSPITAL_BASED_OUTPATIENT_CLINIC_OR_DEPARTMENT_OTHER): Payer: BC Managed Care – PPO

## 2016-09-11 ENCOUNTER — Other Ambulatory Visit: Payer: Self-pay

## 2016-09-11 VITALS — BP 144/71 | HR 74 | Temp 98.6°F | Resp 20 | Ht 67.0 in | Wt 199.6 lb

## 2016-09-11 DIAGNOSIS — M311 Thrombotic microangiopathy: Secondary | ICD-10-CM

## 2016-09-11 DIAGNOSIS — D630 Anemia in neoplastic disease: Secondary | ICD-10-CM | POA: Diagnosis not present

## 2016-09-11 DIAGNOSIS — C9000 Multiple myeloma not having achieved remission: Secondary | ICD-10-CM

## 2016-09-11 DIAGNOSIS — D696 Thrombocytopenia, unspecified: Secondary | ICD-10-CM

## 2016-09-11 LAB — COMPREHENSIVE METABOLIC PANEL
ALT: 17 U/L (ref 0–55)
ANION GAP: 8 meq/L (ref 3–11)
AST: 15 U/L (ref 5–34)
Albumin: 2.9 g/dL — ABNORMAL LOW (ref 3.5–5.0)
Alkaline Phosphatase: 50 U/L (ref 40–150)
BUN: 24.1 mg/dL (ref 7.0–26.0)
CHLORIDE: 107 meq/L (ref 98–109)
CO2: 27 meq/L (ref 22–29)
Calcium: 9.5 mg/dL (ref 8.4–10.4)
Creatinine: 1.1 mg/dL (ref 0.6–1.1)
EGFR: 60 mL/min/{1.73_m2} — AB (ref >90)
Glucose: 138 mg/dl (ref 70–140)
Potassium: 4.1 mEq/L (ref 3.5–5.1)
Sodium: 143 mEq/L (ref 136–145)
Total Bilirubin: 0.53 mg/dL (ref 0.20–1.20)
Total Protein: 6.3 g/dL — ABNORMAL LOW (ref 6.4–8.3)

## 2016-09-11 LAB — CBC WITH DIFFERENTIAL/PLATELET
BASO%: 0.2 % (ref 0.0–2.0)
BASOS ABS: 0 10*3/uL (ref 0.0–0.1)
EOS ABS: 0 10*3/uL (ref 0.0–0.5)
EOS%: 0.3 % (ref 0.0–7.0)
HCT: 30.1 % — ABNORMAL LOW (ref 34.8–46.6)
HGB: 9.9 g/dL — ABNORMAL LOW (ref 11.6–15.9)
LYMPH%: 16.8 % (ref 14.0–49.7)
MCH: 30.5 pg (ref 25.1–34.0)
MCHC: 33 g/dL (ref 31.5–36.0)
MCV: 92.6 fL (ref 79.5–101.0)
MONO#: 0.2 10*3/uL (ref 0.1–0.9)
MONO%: 3.2 % (ref 0.0–14.0)
NEUT#: 5.8 10*3/uL (ref 1.5–6.5)
NEUT%: 79.5 % — AB (ref 38.4–76.8)
PLATELETS: 170 10*3/uL (ref 145–400)
RBC: 3.25 10*6/uL — AB (ref 3.70–5.45)
RDW: 19.3 % — ABNORMAL HIGH (ref 11.2–14.5)
WBC: 7.3 10*3/uL (ref 3.9–10.3)
lymph#: 1.2 10*3/uL (ref 0.9–3.3)

## 2016-09-11 LAB — LACTATE DEHYDROGENASE: LDH: 212 U/L (ref 125–245)

## 2016-09-11 MED ORDER — PREDNISONE 5 MG PO TABS: 15.0000 mg | ORAL_TABLET | Freq: Every day | ORAL | 0 refills | Status: AC

## 2016-09-11 NOTE — Progress Notes (Signed)
[place] OFFICE PROGRESS NOTE   Diagnosis:  TTP, multiple myeloma  INTERVAL HISTORY:   Ms. Carron Brazen returns as scheduled. She feels well. No fever, bleeding, or neurologic symptoms. She continues prednisone at a dose of 20 mg daily. She has noted bilateral ankle edema.  Objective:  Vital signs in last 24 hours:  Blood pressure (!) 144/71, pulse 74, temperature 98.6 F (37 C), temperature source Oral, resp. rate 20, height 5' 7" (1.702 m), weight 199 lb 9.6 oz (90.5 kg), SpO2 100 %.    HEENT: No thrush Resp: Lungs clear bilaterally Cardio: Regular rate and rhythm GI: No hepatosplenomegaly Vascular: Trace edema at the left greater than right lower leg and ankle   Lab Results:  Lab Results  Component Value Date   WBC 7.3 09/11/2016   HGB 9.9 (L) 09/11/2016   HCT 30.1 (L) 09/11/2016   MCV 92.6 09/11/2016   PLT 170 09/11/2016   NEUTROABS 5.8 09/11/2016    CMP     Component Value Date/Time   NA 142 09/07/2016 0836   K 3.6 09/07/2016 0836   CL 104 08/17/2016 0830   CL 110 (H) 06/12/2012 0842   CO2 26 09/07/2016 0836   GLUCOSE 139 09/07/2016 0836   GLUCOSE 94 06/12/2012 0842   BUN 25.0 09/07/2016 0836   CREATININE 1.2 (H) 09/07/2016 0836   CALCIUM 9.3 09/07/2016 0836   PROT 6.1 (L) 09/07/2016 0836   ALBUMIN 2.8 (L) 09/07/2016 0836   AST 15 09/07/2016 0836   ALT 18 09/07/2016 0836   ALKPHOS 49 09/07/2016 0836   BILITOT 0.47 09/07/2016 0836   GFRNONAA 38 (L) 08/17/2016 0830   GFRAA 44 (L) 08/17/2016 0830  LDH-212  09/03/2016: ADAMTS13 activity  greater than 100% Medications: I have reviewed the patient's current medications.  Assessment/Plan: 1. Multiple myeloma, IgG kappa, status post 7 cycles of Revlimid/Decadron and Velcade with marked clinical and laboratory improvement. She is status post melphalan conditioning, followed by an autologous stem cell infusion 09/10/2008. She began maintenance Revlimid 01/04/2009. She has persistent mild  elevation of the kappa and lambda light chains.  Revlimid placed on hold at time of TTP diagnosis  08/04/2016 SPEP with no M spike observed; stable mild elevation of And lambda light chains 2. Neutropenia while on Revlimid at a dose of 10 mg daily. The Revlimid dose was reduced to 5 mg in September 2011 due to persistent neutropenia and a hospital admission with pneumonia/sepsis. neutropenia persists. 3. Admission 11/03/2009 with left lung pneumonia and sepsis syndrome. No specific organism was cultured during the hospital admission. She completed outpatient Levaquin therapy.  4. 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  5. Thoracic spine compression fracture, status post a kyphoplasty 12/05/2007. A metastatic bone survey was unchanged on04/19/2018. 6. Tiny pulmonary embolism on a CT 11/20/2007, now maintained off of Coumadin.  7. Chronic tachycardia on repeat office visits at the [facility].  8. History of hypertension. 9. 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  Prednisone started 08/07/2016   Prednisone taper to 40 mg daily beginning 08/14/2016  Plasmapheresis 08/15/2016  Plasmapheresis 08/17/2016  Prednisone taper to 30 mg daily beginning 08/21/2016  Prednisone tapered to 20 mg daily beginning 09/03/2016  Prednisone tapered to 15 mg daily 09/11/2016  10. Altered mental status-most  likely secondary to TTP,resolved  11. Proteinuria on 24-hour urine 08/04/2016 (2.3 grams).Seen by Dr. Lorrene Reid during hospitalization June 2018.Renal ultrasound 08/09/2016 with no acute abnormality seen. Outpatient follow-up with Dr. Lorrene Reid planned.    Disposition:  Ms.  Moll appears well. The platelet count is now in the normal range. She has persistent mild anemia, but the markers of hemolysis remain improved. The ADAMTS13 activity was normal last week.  We taper the prednisone to 15 mg daily. She will return for an office and lab visit in 2 weeks. She will contact us in the interim for new symptoms.  The mild lower leg edema is likely related to prednisone, proteinuria, and anemia.  She will follow-up with Dr. Lorrene Reid for evaluation of the proteinuria.  25 minutes were spent with the patient today. The majority of the time was used for counseling and coordination of care.   Donneta Romberg, MD  09/11/2016  10:33 AM

## 2016-09-12 ENCOUNTER — Telehealth: Payer: Self-pay | Admitting: Oncology

## 2016-09-12 NOTE — Telephone Encounter (Signed)
S/w pt, advised 7/26 appt m,oved to 7/25. Gave appt for 7/25 @ 2.15pm.

## 2016-09-26 ENCOUNTER — Ambulatory Visit (HOSPITAL_BASED_OUTPATIENT_CLINIC_OR_DEPARTMENT_OTHER): Payer: BC Managed Care – PPO

## 2016-09-26 ENCOUNTER — Ambulatory Visit (HOSPITAL_BASED_OUTPATIENT_CLINIC_OR_DEPARTMENT_OTHER): Payer: BC Managed Care – PPO | Admitting: Nurse Practitioner

## 2016-09-26 ENCOUNTER — Other Ambulatory Visit (HOSPITAL_BASED_OUTPATIENT_CLINIC_OR_DEPARTMENT_OTHER): Payer: BC Managed Care – PPO

## 2016-09-26 VITALS — BP 145/71 | HR 75 | Temp 98.6°F | Resp 18 | Ht 67.0 in | Wt 198.6 lb

## 2016-09-26 DIAGNOSIS — M311 Thrombotic microangiopathy: Secondary | ICD-10-CM

## 2016-09-26 DIAGNOSIS — M3119 Other thrombotic microangiopathy: Secondary | ICD-10-CM

## 2016-09-26 DIAGNOSIS — C9000 Multiple myeloma not having achieved remission: Secondary | ICD-10-CM

## 2016-09-26 LAB — CBC & DIFF AND RETIC
BASO%: 0 % (ref 0.0–2.0)
Basophils Absolute: 0 10*3/uL (ref 0.0–0.1)
EOS ABS: 0 10*3/uL (ref 0.0–0.5)
EOS%: 0 % (ref 0.0–7.0)
HCT: 32.6 % — ABNORMAL LOW (ref 34.8–46.6)
HEMOGLOBIN: 10.3 g/dL — AB (ref 11.6–15.9)
IMMATURE RETIC FRACT: 14.1 % — AB (ref 1.60–10.00)
LYMPH#: 1.5 10*3/uL (ref 0.9–3.3)
LYMPH%: 22.4 % (ref 14.0–49.7)
MCH: 29.9 pg (ref 25.1–34.0)
MCHC: 31.6 g/dL (ref 31.5–36.0)
MCV: 94.8 fL (ref 79.5–101.0)
MONO#: 0.2 10*3/uL (ref 0.1–0.9)
MONO%: 3.1 % (ref 0.0–14.0)
NEUT%: 74.5 % (ref 38.4–76.8)
NEUTROS ABS: 5 10*3/uL (ref 1.5–6.5)
Platelets: 178 10*3/uL (ref 145–400)
RBC: 3.44 10*6/uL — AB (ref 3.70–5.45)
RDW: 16.8 % — AB (ref 11.2–14.5)
RETIC %: 1.74 % (ref 0.70–2.10)
RETIC CT ABS: 59.86 10*3/uL (ref 33.70–90.70)
WBC: 6.8 10*3/uL (ref 3.9–10.3)

## 2016-09-26 LAB — COMPREHENSIVE METABOLIC PANEL
ALBUMIN: 2.9 g/dL — AB (ref 3.5–5.0)
ALK PHOS: 59 U/L (ref 40–150)
ALT: 20 U/L (ref 0–55)
ANION GAP: 9 meq/L (ref 3–11)
AST: 33 U/L (ref 5–34)
BILIRUBIN TOTAL: 0.49 mg/dL (ref 0.20–1.20)
BUN: 14.8 mg/dL (ref 7.0–26.0)
CO2: 26 meq/L (ref 22–29)
CREATININE: 1.1 mg/dL (ref 0.6–1.1)
Calcium: 9.5 mg/dL (ref 8.4–10.4)
Chloride: 106 mEq/L (ref 98–109)
EGFR: 61 mL/min/{1.73_m2} — AB (ref >90)
GLUCOSE: 136 mg/dL (ref 70–140)
Potassium: 4.2 mEq/L (ref 3.5–5.1)
Sodium: 142 mEq/L (ref 136–145)
TOTAL PROTEIN: 6.8 g/dL (ref 6.4–8.3)

## 2016-09-26 LAB — LACTATE DEHYDROGENASE: LDH: 255 U/L — ABNORMAL HIGH (ref 125–245)

## 2016-09-26 MED ORDER — ZOLEDRONIC ACID 4 MG/100ML IV SOLN
4.0000 mg | Freq: Once | INTRAVENOUS | Status: AC
  Administered 2016-09-26: 4 mg via INTRAVENOUS
  Filled 2016-09-26: qty 100

## 2016-09-26 NOTE — Progress Notes (Addendum)
[place] OFFICE PROGRESS NOTE   Diagnosis:  TTP, multiple myeloma  INTERVAL HISTORY:   Carrie Huber returns as scheduled. Prednisone tapered to 15 mg daily 09/11/2016. She feels well. No fever. No unusual headaches. No visual disturbance. No confusion. She denies any bleeding. Leg swelling is better.  Objective:  Vital signs in last 24 hours:  Blood pressure (!) 145/71, pulse 75, temperature 98.6 F (37 C), temperature source Oral, resp. rate 18, height '5\' 7"'  (1.702 m), weight 198 lb 9.6 oz (90.1 kg), SpO2 100 %.    HEENT: No thrush or ulcers. Resp: Lungs clear bilaterally. Cardio: Regular rate and rhythm. GI: Abdomen soft and nontender. No hepatosplenomegaly. Vascular: Trace lower leg edema bilaterally left greater than right.    Lab Results:  Lab Results  Component Value Date   WBC 6.8 09/26/2016   HGB 10.3 (L) 09/26/2016   HCT 32.6 (L) 09/26/2016   MCV 94.8 09/26/2016   PLT 178 09/26/2016   NEUTROABS 5.0 09/26/2016   LDH 255, bilirubin 0.49, creatinine 1.1 Imaging:  No results found.  Medications: I have reviewed the patient's current medications.  Assessment/Plan: 1. Multiple myeloma, IgG kappa, status post 7 cycles of Revlimid/Decadron and Velcade with marked clinical and laboratory improvement. She is status post melphalan conditioning, followed by an autologous stem cell infusion 09/10/2008. She began maintenance Revlimid 01/04/2009. She has persistent mild elevation of the kappa and lambda light chains.  Revlimid placed on hold at time of TTP diagnosis  08/04/2016 SPEP with no M spike observed; stable mild elevation of And lambda light chains 2. Neutropenia while on Revlimid at a dose of 10 mg daily. The Revlimid dose was reduced to 5 mg in September 2011 due to persistent neutropenia and a hospital admission with pneumonia/sepsis. neutropenia persists. 3. Admission 11/03/2009 with left lung pneumonia and sepsis syndrome. No specific  organism was cultured during the hospital admission. She completed outpatient Levaquin therapy.  4. 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  5. Thoracic spine compression fracture, status post a kyphoplasty 12/05/2007. A metastatic bone survey was unchanged on04/19/2018. 6. Tiny pulmonary embolism on a CT 11/20/2007, now maintained off of Coumadin.  7. Chronic tachycardia on repeat office visits at the [facility].  8. History of hypertension. 9. 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  Prednisone started 08/07/2016   Prednisone taper to 40 mg daily beginning 08/14/2016  Plasmapheresis 08/15/2016  Plasmapheresis 08/17/2016  Prednisone taper to 30 mg daily beginning 08/21/2016  Prednisone tapered to 20 mg daily beginning 09/03/2016  Prednisone tapered to 15 mg daily 09/11/2016  Prednisone tapered to 10 mg daily 09/26/2016  10. Altered mental status-most likely secondary to TTP,resolved  11. Proteinuria on 24-hour urine 08/04/2016 (2.3 grams).Seen by Dr. Lorrene Reid during hospitalization June 2018.Renal ultrasound 08/09/2016 with no acute abnormality seen. Outpatient follow-up with Dr. Lorrene Reid planned.     Disposition: Carrie Huber appears well. The platelet count remains in normal range. Hemoglobin is stable to mildly improved. LDH is mildly elevated. She will taper the prednisone to 10 mg daily. Plan for follow-up CBC and LDH in 3 weeks.  She continues to have mild lower leg edema. This is likely multifactorial secondary to prednisone, proteinuria, anemia.  She has an upcoming appointment with Dr. Lorrene Reid for evaluation  of the proteinuria.  She has an  upcoming appointment with Dr. Ok Edwards at [facility] for follow-up of myeloma.  We scheduled a return visit here on 11/01/2016. She will contact the office in the interim with any problems.  Patient seen with Dr. Benay Spice.    Ned Card ANP/GNP-BC   09/26/2016  3:29 PM  This was a shared visit with Ned Card. She appears to be in remission from the TTP. We tapered the prednisone to 10 mg daily. She will see Dr. Ok Edwards next month to discuss the indication for resuming Revlimid.  Julieanne Manson, M.D.

## 2016-09-26 NOTE — Patient Instructions (Signed)
Zoledronic Acid injection (Hypercalcemia, Oncology) (Zometa) What is this medicine? ZOLEDRONIC ACID (ZOE le dron ik AS id) lowers the amount of calcium loss from bone. It is used to treat too much calcium in your blood from cancer. It is also used to prevent complications of cancer that has spread to the bone. This medicine may be used for other purposes; ask your health care provider or pharmacist if you have questions. What should I tell my health care provider before I take this medicine? They need to know if you have any of these conditions: -aspirin-sensitive asthma -cancer, especially if you are receiving medicines used to treat cancer -dental disease or wear dentures -infection -kidney disease -receiving corticosteroids like dexamethasone or prednisone -an unusual or allergic reaction to zoledronic acid, other medicines, foods, dyes, or preservatives -pregnant or trying to get pregnant -breast-feeding How should I use this medicine? This medicine is for infusion into a vein. It is given by a health care professional in a hospital or clinic setting. Talk to your pediatrician regarding the use of this medicine in children. Special care may be needed. Overdosage: If you think you have taken too much of this medicine contact a poison control center or emergency room at once. NOTE: This medicine is only for you. Do not share this medicine with others. What if I miss a dose? It is important not to miss your dose. Call your doctor or health care professional if you are unable to keep an appointment. What may interact with this medicine? -certain antibiotics given by injection -NSAIDs, medicines for pain and inflammation, like ibuprofen or naproxen -some diuretics like bumetanide, furosemide -teriparatide -thalidomide This list may not describe all possible interactions. Give your health care provider a list of all the medicines, herbs, non-prescription drugs, or dietary supplements you  use. Also tell them if you smoke, drink alcohol, or use illegal drugs. Some items may interact with your medicine. What should I watch for while using this medicine? Visit your doctor or health care professional for regular checkups. It may be some time before you see the benefit from this medicine. Do not stop taking your medicine unless your doctor tells you to. Your doctor may order blood tests or other tests to see how you are doing. Women should inform their doctor if they wish to become pregnant or think they might be pregnant. There is a potential for serious side effects to an unborn child. Talk to your health care professional or pharmacist for more information. You should make sure that you get enough calcium and vitamin D while you are taking this medicine. Discuss the foods you eat and the vitamins you take with your health care professional. Some people who take this medicine have severe bone, joint, and/or muscle pain. This medicine may also increase your risk for jaw problems or a broken thigh bone. Tell your doctor right away if you have severe pain in your jaw, bones, joints, or muscles. Tell your doctor if you have any pain that does not go away or that gets worse. Tell your dentist and dental surgeon that you are taking this medicine. You should not have major dental surgery while on this medicine. See your dentist to have a dental exam and fix any dental problems before starting this medicine. Take good care of your teeth while on this medicine. Make sure you see your dentist for regular follow-up appointments. What side effects may I notice from receiving this medicine? Side effects that you should report   to your doctor or health care professional as soon as possible: -allergic reactions like skin rash, itching or hives, swelling of the face, lips, or tongue -anxiety, confusion, or depression -breathing problems -changes in vision -eye pain -feeling faint or lightheaded,  falls -jaw pain, especially after dental work -mouth sores -muscle cramps, stiffness, or weakness -redness, blistering, peeling or loosening of the skin, including inside the mouth -trouble passing urine or change in the amount of urine Side effects that usually do not require medical attention (report to your doctor or health care professional if they continue or are bothersome): -bone, joint, or muscle pain -constipation -diarrhea -fever -hair loss -irritation at site where injected -loss of appetite -nausea, vomiting -stomach upset -trouble sleeping -trouble swallowing -weak or tired This list may not describe all possible side effects. Call your doctor for medical advice about side effects. You may report side effects to FDA at 1-800-FDA-1088. Where should I keep my medicine? This drug is given in a hospital or clinic and will not be stored at home. NOTE: This sheet is a summary. It may not cover all possible information. If you have questions about this medicine, talk to your doctor, pharmacist, or health care provider.    2016, Elsevier/Gold Standard. (2013-07-18 14:19:39)   Neutropenia Neutropenia is a condition that occurs when the level of a certain type of white blood cell (neutrophil) in your body becomes lower than normal. Neutrophils are made in the bone marrow and fight infections. These cells protect against bacteria and viruses. The fewer neutrophils you have, and the longer your body remains without them, the greater your risk of getting a severe infection becomes. CAUSES  The cause of neutropenia may be hard to determine. However, it is usually due to 3 main problems:   Decreased production of neutrophils. This may be due to:  Certain medicines such as chemotherapy.  Genetic problems.  Cancer.  Radiation treatments.  Vitamin deficiency.  Some pesticides.  Increased destruction of neutrophils. This may be due to:  Overwhelming infections.  Hemolytic  anemia. This is when the body destroys its own blood cells.  Chemotherapy.  Neutrophils moving to areas of the body where they cannot fight infections. This may be due to:  Dialysis procedures.  Conditions where the spleen becomes enlarged. Neutrophils are held in the spleen and are not available to the rest of the body.  Overwhelming infections. The neutrophils are held in the area of the infection and are not available to the rest of the body. SYMPTOMS  There are no specific symptoms of neutropenia. The lack of neutrophils can result in an infection, and an infection can cause various problems. DIAGNOSIS  Diagnosis is made by a blood test. A complete blood count is performed. The normal level of neutrophils in human blood differs with age and race. Infants have lower counts than older children and adults. African Americans have lower counts than Caucasians or Asians. The average adult level is 1500 cells/mm3 of blood. Neutrophil counts are interpreted as follows:  Greater than 1000 cells/mm3 gives normal protection against infection.  500 to 1000 cells/mm3 gives an increased risk for infection.  200 to 500 cells/mm3 is a greater risk for severe infection.  Lower than 200 cells/mm3 is a marked risk of infection. This may require hospitalization and treatment with antibiotic medicines. TREATMENT  Treatment depends on the underlying cause, severity, and presence of infections or symptoms. It also depends on your health. Your caregiver will discuss the treatment plan with you.  Mild cases are often easily treated and have a good outcome. Preventative measures may also be started to limit your risk of infections. Treatment can include:  Taking antibiotics.  Stopping medicines that are known to cause neutropenia.  Correcting nutritional deficiencies by eating green vegetables to supply folic acid and taking vitamin B supplements.  Stopping exposure to pesticides if your neutropenia is  related to pesticide exposure.  Taking a blood growth factor called sargramostim, pegfilgrastim, or filgrastim if you are undergoing chemotherapy for cancer. This stimulates white blood cell production.  Removal of the spleen if you have Felty's syndrome and have repeated infections. HOME CARE INSTRUCTIONS   Follow your caregiver's instructions about when you need to have blood work done.  Wash your hands often. Make sure others who come in contact with you also wash their hands.  Wash raw fruits and vegetables before eating them. They can carry bacteria and fungi.  Avoid people with colds or spreadable (contagious) diseases (chickenpox, herpes zoster, influenza).  Avoid large crowds.  Avoid construction areas. The dust can release fungus into the air.  Be cautious around children in daycare or school environments.  Take care of your respiratory system by coughing and deep breathing.  Bathe daily.  Protect your skin from cuts and burns.  Do not work in the garden or with flowers and plants.  Care for the mouth before and after meals by brushing with a soft toothbrush. If you have mucositis, do not use mouthwash. Mouthwash contains alcohol and can dry out the mouth even more.  Clean the area between the genitals and the anus (perineal area) after urination and bowel movements. Women need to wipe from front to back.  Use a water soluble lubricant during sexual intercourse and practice good hygiene after. Do not have intercourse if you are severely neutropenic. Check with your caregiver for guidelines.  Exercise daily as tolerated.  Avoid people who were vaccinated with a live vaccine in the past 30 days. You should not receive live vaccines (polio, typhoid).  Do not provide direct care for pets. Avoid animal droppings. Do not clean litter boxes and bird cages.  Do not share food utensils.  Do not use tampons, enemas, or rectal suppositories unless directed by your  caregiver.  Use an electric razor to remove hair.  Wash your hands after handling magazines, letters, and newspapers. SEEK IMMEDIATE MEDICAL CARE IF:   You have a fever.  You have chills or start to shake.  You feel nauseous or vomit.  You develop mouth sores.  You develop aches and pains.  You have redness and swelling around open wounds.  Your skin is warm to the touch.  You have pus coming from your wounds.  You develop swollen lymph nodes.  You feel weak or fatigued.  You develop red streaks on the skin. MAKE SURE YOU:  Understand these instructions.  Will watch your condition.  Will get help right away if you are not doing well or get worse.   This information is not intended to replace advice given to you by your health care provider. Make sure you discuss any questions you have with your health care provider.   Document Released: 08/11/2001 Document Revised: 05/14/2011 Document Reviewed: 09/01/2014 Elsevier Interactive Patient Education [employer].

## 2016-09-27 ENCOUNTER — Other Ambulatory Visit: Payer: BC Managed Care – PPO

## 2016-09-27 ENCOUNTER — Ambulatory Visit: Payer: BC Managed Care – PPO

## 2016-09-27 LAB — KAPPA/LAMBDA LIGHT CHAINS
Ig Kappa Free Light Chain: 18.6 mg/L (ref 3.3–19.4)
Ig Lambda Free Light Chain: 33.1 mg/L — ABNORMAL HIGH (ref 5.7–26.3)
Kappa/Lambda FluidC Ratio: 0.56 (ref 0.26–1.65)

## 2016-09-27 LAB — IGG: IgG, Qn, Serum: 854 mg/dL (ref 700–1600)

## 2016-09-28 ENCOUNTER — Telehealth: Payer: Self-pay

## 2016-09-28 NOTE — Telephone Encounter (Signed)
Spoke to patient in regards to appointments on 8/15, and 8/30

## 2016-10-01 ENCOUNTER — Telehealth: Payer: Self-pay

## 2016-10-01 NOTE — Telephone Encounter (Signed)
RN at [facility] said they don't usually see people 8 years out. Dr Benay Spice could confer/refer with a myeloma doctor without sending pt to the bone marrow clinic to determine if pt should restart revlimid. Pt is emotionally OK to be off the revlimid. Next labs 8/15 Next lab/md 8/30

## 2016-10-04 NOTE — Telephone Encounter (Addendum)
Dr. Benay Spice made aware. MD will discuss with pt at next visit. Pt notified, voiced understanding.

## 2016-10-17 ENCOUNTER — Other Ambulatory Visit (HOSPITAL_BASED_OUTPATIENT_CLINIC_OR_DEPARTMENT_OTHER): Payer: BC Managed Care – PPO

## 2016-10-17 DIAGNOSIS — M3119 Other thrombotic microangiopathy: Secondary | ICD-10-CM

## 2016-10-17 DIAGNOSIS — M311 Thrombotic microangiopathy: Secondary | ICD-10-CM

## 2016-10-17 DIAGNOSIS — C9000 Multiple myeloma not having achieved remission: Secondary | ICD-10-CM | POA: Diagnosis not present

## 2016-10-17 LAB — CBC WITH DIFFERENTIAL/PLATELET
BASO%: 0.1 % (ref 0.0–2.0)
Basophils Absolute: 0 10*3/uL (ref 0.0–0.1)
EOS ABS: 0 10*3/uL (ref 0.0–0.5)
EOS%: 0.1 % (ref 0.0–7.0)
HCT: 32.4 % — ABNORMAL LOW (ref 34.8–46.6)
HGB: 10.6 g/dL — ABNORMAL LOW (ref 11.6–15.9)
LYMPH%: 26.4 % (ref 14.0–49.7)
MCH: 30.1 pg (ref 25.1–34.0)
MCHC: 32.7 g/dL (ref 31.5–36.0)
MCV: 92 fL (ref 79.5–101.0)
MONO#: 0.3 10*3/uL (ref 0.1–0.9)
MONO%: 5.1 % (ref 0.0–14.0)
NEUT#: 3.9 10*3/uL (ref 1.5–6.5)
NEUT%: 68.3 % (ref 38.4–76.8)
PLATELETS: 206 10*3/uL (ref 145–400)
RBC: 3.52 10*6/uL — AB (ref 3.70–5.45)
RDW: 16.8 % — ABNORMAL HIGH (ref 11.2–14.5)
WBC: 5.7 10*3/uL (ref 3.9–10.3)
lymph#: 1.5 10*3/uL (ref 0.9–3.3)

## 2016-10-17 LAB — LACTATE DEHYDROGENASE: LDH: 198 U/L (ref 125–245)

## 2016-11-01 ENCOUNTER — Other Ambulatory Visit: Payer: Self-pay

## 2016-11-01 ENCOUNTER — Ambulatory Visit (HOSPITAL_BASED_OUTPATIENT_CLINIC_OR_DEPARTMENT_OTHER): Payer: BC Managed Care – PPO | Admitting: Oncology

## 2016-11-01 ENCOUNTER — Telehealth: Payer: Self-pay | Admitting: Oncology

## 2016-11-01 ENCOUNTER — Other Ambulatory Visit (HOSPITAL_BASED_OUTPATIENT_CLINIC_OR_DEPARTMENT_OTHER): Payer: BC Managed Care – PPO

## 2016-11-01 VITALS — BP 137/70 | HR 74 | Temp 98.4°F | Resp 18 | Ht 67.0 in | Wt 207.7 lb

## 2016-11-01 DIAGNOSIS — M311 Thrombotic microangiopathy: Secondary | ICD-10-CM

## 2016-11-01 DIAGNOSIS — C9000 Multiple myeloma not having achieved remission: Secondary | ICD-10-CM | POA: Diagnosis not present

## 2016-11-01 DIAGNOSIS — Z862 Personal history of diseases of the blood and blood-forming organs and certain disorders involving the immune mechanism: Secondary | ICD-10-CM

## 2016-11-01 DIAGNOSIS — M3119 Other thrombotic microangiopathy: Secondary | ICD-10-CM

## 2016-11-01 LAB — COMPREHENSIVE METABOLIC PANEL
ALT: 16 U/L (ref 0–55)
ANION GAP: 7 meq/L (ref 3–11)
AST: 17 U/L (ref 5–34)
Albumin: 3.1 g/dL — ABNORMAL LOW (ref 3.5–5.0)
Alkaline Phosphatase: 69 U/L (ref 40–150)
BUN: 20.1 mg/dL (ref 7.0–26.0)
CHLORIDE: 107 meq/L (ref 98–109)
CO2: 26 meq/L (ref 22–29)
CREATININE: 1.2 mg/dL — AB (ref 0.6–1.1)
Calcium: 9.6 mg/dL (ref 8.4–10.4)
EGFR: 58 mL/min/{1.73_m2} — ABNORMAL LOW (ref >90)
Glucose: 125 mg/dl (ref 70–140)
POTASSIUM: 4.1 meq/L (ref 3.5–5.1)
Sodium: 141 mEq/L (ref 136–145)
Total Bilirubin: 0.5 mg/dL (ref 0.20–1.20)
Total Protein: 7.3 g/dL (ref 6.4–8.3)

## 2016-11-01 LAB — CBC & DIFF AND RETIC
BASO%: 0 % (ref 0.0–2.0)
Basophils Absolute: 0 10*3/uL (ref 0.0–0.1)
EOS%: 0.2 % (ref 0.0–7.0)
Eosinophils Absolute: 0 10*3/uL (ref 0.0–0.5)
HCT: 34 % — ABNORMAL LOW (ref 34.8–46.6)
HGB: 10.9 g/dL — ABNORMAL LOW (ref 11.6–15.9)
Immature Retic Fract: 7.8 % (ref 1.60–10.00)
LYMPH#: 1.3 10*3/uL (ref 0.9–3.3)
LYMPH%: 23.4 % (ref 14.0–49.7)
MCH: 29.9 pg (ref 25.1–34.0)
MCHC: 32.1 g/dL (ref 31.5–36.0)
MCV: 93.2 fL (ref 79.5–101.0)
MONO#: 0.2 10*3/uL (ref 0.1–0.9)
MONO%: 3.7 % (ref 0.0–14.0)
NEUT#: 4.2 10*3/uL (ref 1.5–6.5)
NEUT%: 72.7 % (ref 38.4–76.8)
PLATELETS: 188 10*3/uL (ref 145–400)
RBC: 3.65 10*6/uL — AB (ref 3.70–5.45)
RDW: 15.3 % — AB (ref 11.2–14.5)
RETIC %: 1.31 % (ref 0.70–2.10)
RETIC CT ABS: 47.82 10*3/uL (ref 33.70–90.70)
WBC: 5.7 10*3/uL (ref 3.9–10.3)

## 2016-11-01 LAB — LACTATE DEHYDROGENASE: LDH: 200 U/L (ref 125–245)

## 2016-11-01 MED ORDER — PREDNISONE 5 MG PO TABS
5.0000 mg | ORAL_TABLET | Freq: Every day | ORAL | 1 refills | Status: AC
Start: 2016-11-01 — End: 2016-12-31

## 2016-11-01 NOTE — Telephone Encounter (Signed)
Spoke with patient about upcoming appts. Did not want avs or calendar.

## 2016-11-01 NOTE — Progress Notes (Signed)
[place] OFFICE PROGRESS NOTE   Diagnosis: TTP, myeloma  INTERVAL HISTORY:   [name] returns as scheduled. She feels well. She is maintained on prednisone at a dose of 10 mg daily. No new complaint. She has left greater than right low leg/foot edema. No leg pain.  Objective:  Vital signs in last 24 hours:  Blood pressure 137/70, pulse 74, temperature 98.4 F (36.9 C), temperature source Oral, resp. rate 18, height '5\' 7"'  (1.702 m), weight 207 lb 11.2 oz (94.2 kg), SpO2 100 %.    HEENT: No thrush Resp: Lungs clear bilaterally Cardio: Regular rate and rhythm GI: No hepatosplenomegaly Vascular: Trace-1+ pitting edema at the left greater than right lower leg and foot. No erythema or tenderness   Lab Results:  Lab Results  Component Value Date   WBC 5.7 11/01/2016   HGB 10.9 (L) 11/01/2016   HCT 34.0 (L) 11/01/2016   MCV 93.2 11/01/2016   PLT 188 11/01/2016   NEUTROABS 4.2 11/01/2016    CMP     Component Value Date/Time   NA 141 11/01/2016 1433   K 4.1 11/01/2016 1433   CL 104 08/17/2016 0830   CL 110 (H) 06/12/2012 0842   CO2 26 11/01/2016 1433   GLUCOSE 125 11/01/2016 1433   GLUCOSE 94 06/12/2012 0842   BUN 20.1 11/01/2016 1433   CREATININE 1.2 (H) 11/01/2016 1433   CALCIUM 9.6 11/01/2016 1433   PROT 7.3 11/01/2016 1433   ALBUMIN 3.1 (L) 11/01/2016 1433   AST 17 11/01/2016 1433   ALT 16 11/01/2016 1433   ALKPHOS 69 11/01/2016 1433   BILITOT 0.50 11/01/2016 1433   GFRNONAA 38 (L) 08/17/2016 0830   GFRAA 44 (L) 08/17/2016 0830   09/26/2016: IgG-854, free kappa light chains 18.6  Medications: I have reviewed the patient's current medications.  Assessment/Plan: 1. Multiple myeloma, IgG kappa, status post 7 cycles of Revlimid/Decadron and Velcade with marked clinical and laboratory improvement. She is status post melphalan conditioning, followed by an autologous stem cell infusion 09/10/2008. She began maintenance Revlimid 01/04/2009. She  has persistent mild elevation of the kappa and lambda light chains.  Revlimid placed on hold at time of TTP diagnosis  08/04/2016 SPEP with no M spike observed; stable mild elevation of And lambda light chains 2. Neutropenia while on Revlimid at a dose of 10 mg daily. The Revlimid dose was reduced to 5 mg in September 2011 due to persistent neutropenia and a hospital admission with pneumonia/sepsis. neutropenia persists. 3. Admission 11/03/2009 with left lung pneumonia and sepsis syndrome. No specific organism was cultured during the hospital admission. She completed outpatient Levaquin therapy.  4. 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  5. Thoracic spine compression fracture, status post a kyphoplasty 12/05/2007. A metastatic bone survey was unchanged on04/19/2018. 6. Tiny pulmonary embolism on a CT 11/20/2007, now maintained off of Coumadin.  7. Chronic tachycardia on repeat office visits at the [facility]  8. History of hypertension. 9. 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  Prednisone started 08/07/2016   Prednisone taper to 40 mg daily beginning 08/14/2016  Plasmapheresis 08/15/2016  Plasmapheresis 08/17/2016  Prednisone taper to 30 mg daily beginning 08/21/2016  Prednisone tapered to 20 mg daily beginning 09/03/2016  Prednisone tapered to 15 mg daily  09/11/2016  Prednisone tapered to 10 mg daily 09/26/2016  Prednisone taper to 5 mg daily beginning 11/02/2016  10. Altered mental status-most likely secondary to TTP,resolved  11. Proteinuria on 24-hour urine 08/04/2016 (2.3 grams).Seen by Dr. Lorrene Reid during hospitalization June 2018.Renal  ultrasound 08/09/2016 with no acute abnormality seen. She is followed by Dr. Lorrene Reid.    Disposition:  Carrie Huber appears stable. She is in clinical remission from the TTP. There is no clinical or laboratory evidence for progression of the myeloma. We tapered the prednisone to 5 mg daily. She will return for an office and lab visit in 4 weeks.  15 minutes were spent with the patient today. The majority of the time was used for counseling and coordination of care.   Donneta Romberg, MD  11/01/2016  3:42 PM

## 2016-11-02 LAB — VITAMIN B12: Vitamin B12: 752 pg/mL (ref 232–1245)

## 2016-11-20 ENCOUNTER — Telehealth: Payer: Self-pay | Admitting: *Deleted

## 2016-11-20 NOTE — Telephone Encounter (Signed)
Message from pt with medication questions:Can she wean Prednisone to 5mg  QOD? She is currently taking 5mg  daily (since 8/30). Should she continue B12?  Message to MD.

## 2016-11-20 NOTE — Telephone Encounter (Signed)
Continue B12 Continue prednsone at 5mg , will taper if cbc stable next visit

## 2016-11-28 ENCOUNTER — Telehealth: Payer: Self-pay | Admitting: Nurse Practitioner

## 2016-11-28 ENCOUNTER — Other Ambulatory Visit (HOSPITAL_BASED_OUTPATIENT_CLINIC_OR_DEPARTMENT_OTHER): Payer: BC Managed Care – PPO

## 2016-11-28 ENCOUNTER — Ambulatory Visit (HOSPITAL_BASED_OUTPATIENT_CLINIC_OR_DEPARTMENT_OTHER): Payer: BC Managed Care – PPO | Admitting: Nurse Practitioner

## 2016-11-28 VITALS — BP 141/77 | HR 72 | Temp 98.9°F | Resp 16 | Wt 213.3 lb

## 2016-11-28 DIAGNOSIS — M3119 Other thrombotic microangiopathy: Secondary | ICD-10-CM

## 2016-11-28 DIAGNOSIS — M311 Thrombotic microangiopathy: Secondary | ICD-10-CM

## 2016-11-28 DIAGNOSIS — C9000 Multiple myeloma not having achieved remission: Secondary | ICD-10-CM

## 2016-11-28 DIAGNOSIS — Z23 Encounter for immunization: Secondary | ICD-10-CM

## 2016-11-28 DIAGNOSIS — C9001 Multiple myeloma in remission: Secondary | ICD-10-CM

## 2016-11-28 LAB — CBC WITH DIFFERENTIAL/PLATELET
BASO%: 0.3 % (ref 0.0–2.0)
BASOS ABS: 0 10*3/uL (ref 0.0–0.1)
EOS%: 0.6 % (ref 0.0–7.0)
Eosinophils Absolute: 0 10*3/uL (ref 0.0–0.5)
HCT: 31.8 % — ABNORMAL LOW (ref 34.8–46.6)
HEMOGLOBIN: 10.5 g/dL — AB (ref 11.6–15.9)
LYMPH%: 29.2 % (ref 14.0–49.7)
MCH: 30.1 pg (ref 25.1–34.0)
MCHC: 33.2 g/dL (ref 31.5–36.0)
MCV: 90.5 fL (ref 79.5–101.0)
MONO#: 0.4 10*3/uL (ref 0.1–0.9)
MONO%: 9.6 % (ref 0.0–14.0)
NEUT#: 2.6 10*3/uL (ref 1.5–6.5)
NEUT%: 60.3 % (ref 38.4–76.8)
Platelets: 174 10*3/uL (ref 145–400)
RBC: 3.51 10*6/uL — ABNORMAL LOW (ref 3.70–5.45)
RDW: 16 % — AB (ref 11.2–14.5)
WBC: 4.4 10*3/uL (ref 3.9–10.3)
lymph#: 1.3 10*3/uL (ref 0.9–3.3)

## 2016-11-28 LAB — COMPREHENSIVE METABOLIC PANEL
ALT: 12 U/L (ref 0–55)
AST: 16 U/L (ref 5–34)
Albumin: 3 g/dL — ABNORMAL LOW (ref 3.5–5.0)
Alkaline Phosphatase: 64 U/L (ref 40–150)
Anion Gap: 7 mEq/L (ref 3–11)
BUN: 17.3 mg/dL (ref 7.0–26.0)
CHLORIDE: 109 meq/L (ref 98–109)
CO2: 26 mEq/L (ref 22–29)
CREATININE: 1.1 mg/dL (ref 0.6–1.1)
Calcium: 9.6 mg/dL (ref 8.4–10.4)
EGFR: 62 mL/min/{1.73_m2} — ABNORMAL LOW (ref >90)
GLUCOSE: 106 mg/dL (ref 70–140)
POTASSIUM: 3.6 meq/L (ref 3.5–5.1)
SODIUM: 142 meq/L (ref 136–145)
Total Bilirubin: 0.38 mg/dL (ref 0.20–1.20)
Total Protein: 6.8 g/dL (ref 6.4–8.3)

## 2016-11-28 LAB — LACTATE DEHYDROGENASE: LDH: 175 U/L (ref 125–245)

## 2016-11-28 MED ORDER — INFLUENZA VAC SPLIT QUAD 0.5 ML IM SUSY
0.5000 mL | PREFILLED_SYRINGE | Freq: Once | INTRAMUSCULAR | Status: AC
  Administered 2016-11-28: 0.5 mL via INTRAMUSCULAR
  Filled 2016-11-28: qty 0.5

## 2016-11-28 NOTE — Progress Notes (Addendum)
[place] OFFICE PROGRESS NOTE   Diagnosis:  TTP, multiple myeloma  INTERVAL HISTORY:   Carrie Huber returns as scheduled. Prednisone was tapered to 5 mg daily beginning 11/02/2016. She overall feels well. No bleeding. No fever. No unusual headaches. She continues to gain weight.  Objective:  Vital signs in last 24 hours:  Blood pressure (!) 141/77, pulse 72, temperature 98.9 F (37.2 C), temperature source Oral, resp. rate 16, weight 213 lb 4.8 oz (96.8 kg), SpO2 99 %.    HEENT: No thrush or ulcers. Resp: Lungs clear bilaterally. Cardio: Regular rate and rhythm. GI: Abdomen soft and nontender. No hepatomegaly. Vascular: Pitting edema at the lower legs bilaterally left greater than right.  Lab Results:  Lab Results  Component Value Date   WBC 4.4 11/28/2016   HGB 10.5 (L) 11/28/2016   HCT 31.8 (L) 11/28/2016   MCV 90.5 11/28/2016   PLT 174 11/28/2016   NEUTROABS 2.6 11/28/2016    Imaging:  No results found.  Medications: I have reviewed the patient's current medications.  Assessment/Plan: 1. Multiple myeloma, IgG kappa, status post 7 cycles of Revlimid/Decadron and Velcade with marked clinical and laboratory improvement. She is status post melphalan conditioning, followed by an autologous stem cell infusion 09/10/2008. She began maintenance Revlimid 01/04/2009. She has persistent mild elevation of the kappa and lambda light chains.  Revlimid placed on hold at time of TTP diagnosis  08/04/2016 SPEP with no M spike observed; stable mild elevation of lambda light chains  09/26/2016 mild elevation of lambda light chains, IgG in normal range 2. Neutropenia while on Revlimid at a dose of 10 mg daily. The Revlimid dose was reduced to 5 mg in September 2011 due to persistent neutropenia and a hospital admission with pneumonia/sepsis. . 3. Admission 11/03/2009 with left lung pneumonia and sepsis syndrome. No specific organism was cultured during the hospital  admission. She completed outpatient Levaquin therapy.  4. 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  5. Thoracic spine compression fracture, status post a kyphoplasty 12/05/2007. A metastatic bone survey was unchanged on04/19/2018. 6. Tiny pulmonary embolism on a CT 11/20/2007, now maintained off of Coumadin.  7. Chronic tachycardia on repeat office visits at the [facility].  8. History of hypertension. 9. 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  Prednisone started 08/07/2016   Prednisone taper to 40 mg daily beginning 08/14/2016  Plasmapheresis 08/15/2016  Plasmapheresis 08/17/2016  Prednisone taper to 30 mg daily beginning 08/21/2016  Prednisone tapered to 20 mg daily beginning 09/03/2016  Prednisone tapered to 15 mg daily 09/11/2016  Prednisone tapered to 10 mg daily 09/26/2016  Prednisone tapered to 5 mg daily beginning 11/02/2016  Prednisone tapered to 2.5 mg daily for 7 days beginning 11/28/2016, then 2.5 mg every other day for 5 doses, then discontinue  10. Altered mental status-most likely secondary to TTP,resolved  11. Proteinuria on 24-hour urine 08/04/2016 (2.3 grams).Seen by Dr. Lorrene Huber during hospitalization June 2018.Renal ultrasound 08/09/2016 with no acute abnormality seen. She is followed by Dr. Lorrene Huber.     Disposition: Carrie Huber appears stable. She remains in clinical remission from TTP. She will decrease prednisone from 5 mg daily to 2.5 mg daily for one week and then 2.5 mg every other day for 5 doses and then discontinue.  She will return for labs in 4 weeks. We will see her in follow-up in 8 weeks.   Myeloma labs from today are  pending. We will follow-up on those results. She will return for Zometa as scheduled 12/27/2016 with plans to adjust Zometa to every 6 months thereafter.  She will receive the influenza vaccine today.  Plan reviewed with Carrie Huber.    Ned Card ANP/GNP-BC   11/28/2016  11:04 AM

## 2016-11-28 NOTE — Telephone Encounter (Signed)
Scheduled appt per 9/26 los - Gave pt avs and calender per los.

## 2016-11-29 LAB — KAPPA/LAMBDA LIGHT CHAINS
IG LAMBDA FREE LIGHT CHAIN: 36.9 mg/L — AB (ref 5.7–26.3)
Ig Kappa Free Light Chain: 28.2 mg/L — ABNORMAL HIGH (ref 3.3–19.4)
KAPPA/LAMBDA FLC RATIO: 0.76 (ref 0.26–1.65)

## 2016-11-29 LAB — IGG: IgG, Qn, Serum: 1075 mg/dL (ref 700–1600)

## 2016-12-26 ENCOUNTER — Telehealth: Payer: Self-pay | Admitting: Oncology

## 2016-12-26 ENCOUNTER — Telehealth: Payer: Self-pay

## 2016-12-26 NOTE — Telephone Encounter (Signed)
Spoke with patient regarding her request to move her infusion appointment. Informed her that scheduling will call her to reschedule her upcoming infusion appointment. Pt verbalizes understanding and agrees with plan of care.

## 2016-12-26 NOTE — Telephone Encounter (Signed)
Patient called in wanting to reschedule and I did receive a schedule msg.

## 2016-12-27 ENCOUNTER — Ambulatory Visit: Payer: BC Managed Care – PPO | Admitting: Oncology

## 2016-12-27 ENCOUNTER — Other Ambulatory Visit: Payer: BC Managed Care – PPO

## 2016-12-27 ENCOUNTER — Ambulatory Visit: Payer: BC Managed Care – PPO

## 2017-01-01 ENCOUNTER — Ambulatory Visit (HOSPITAL_BASED_OUTPATIENT_CLINIC_OR_DEPARTMENT_OTHER): Payer: BC Managed Care – PPO

## 2017-01-01 ENCOUNTER — Other Ambulatory Visit (HOSPITAL_BASED_OUTPATIENT_CLINIC_OR_DEPARTMENT_OTHER): Payer: BC Managed Care – PPO

## 2017-01-01 VITALS — BP 126/77 | HR 67 | Temp 98.2°F | Resp 18

## 2017-01-01 DIAGNOSIS — C9000 Multiple myeloma not having achieved remission: Secondary | ICD-10-CM

## 2017-01-01 DIAGNOSIS — M311 Thrombotic microangiopathy: Secondary | ICD-10-CM

## 2017-01-01 DIAGNOSIS — M3119 Other thrombotic microangiopathy: Secondary | ICD-10-CM

## 2017-01-01 LAB — COMPREHENSIVE METABOLIC PANEL
ALBUMIN: 3.2 g/dL — AB (ref 3.5–5.0)
ALK PHOS: 76 U/L (ref 40–150)
ALT: 11 U/L (ref 0–55)
ANION GAP: 7 meq/L (ref 3–11)
AST: 15 U/L (ref 5–34)
BUN: 12.9 mg/dL (ref 7.0–26.0)
CALCIUM: 9.2 mg/dL (ref 8.4–10.4)
CHLORIDE: 111 meq/L — AB (ref 98–109)
CO2: 25 mEq/L (ref 22–29)
CREATININE: 0.9 mg/dL (ref 0.6–1.1)
EGFR: >60 mL/min/{1.73_m2} (ref >60)
Glucose: 117 mg/dl (ref 70–140)
Potassium: 3.2 mEq/L — ABNORMAL LOW (ref 3.5–5.1)
Sodium: 142 mEq/L (ref 136–145)
Total Bilirubin: 0.74 mg/dL (ref 0.20–1.20)
Total Protein: 6.9 g/dL (ref 6.4–8.3)

## 2017-01-01 LAB — CBC WITH DIFFERENTIAL/PLATELET
BASO%: 0.3 % (ref 0.0–2.0)
Basophils Absolute: 0 10*3/uL (ref 0.0–0.1)
EOS ABS: 0 10*3/uL (ref 0.0–0.5)
EOS%: 1.2 % (ref 0.0–7.0)
HCT: 32.3 % — ABNORMAL LOW (ref 34.8–46.6)
HEMOGLOBIN: 10.2 g/dL — AB (ref 11.6–15.9)
LYMPH%: 42.9 % (ref 14.0–49.7)
MCH: 28.8 pg (ref 25.1–34.0)
MCHC: 31.6 g/dL (ref 31.5–36.0)
MCV: 91.2 fL (ref 79.5–101.0)
MONO#: 0.3 10*3/uL (ref 0.1–0.9)
MONO%: 8 % (ref 0.0–14.0)
NEUT#: 1.6 10*3/uL (ref 1.5–6.5)
NEUT%: 47.6 % (ref 38.4–76.8)
PLATELETS: 169 10*3/uL (ref 145–400)
RBC: 3.54 10*6/uL — ABNORMAL LOW (ref 3.70–5.45)
RDW: 15.1 % — ABNORMAL HIGH (ref 11.2–14.5)
WBC: 3.3 10*3/uL — AB (ref 3.9–10.3)
lymph#: 1.4 10*3/uL (ref 0.9–3.3)

## 2017-01-01 LAB — LACTATE DEHYDROGENASE: LDH: 173 U/L (ref 125–245)

## 2017-01-01 MED ORDER — ZOLEDRONIC ACID 4 MG/100ML IV SOLN
4.0000 mg | Freq: Once | INTRAVENOUS | Status: AC
  Administered 2017-01-01: 4 mg via INTRAVENOUS
  Filled 2017-01-01: qty 100

## 2017-01-01 NOTE — Patient Instructions (Signed)
Cancer Center Discharge Instructions for Patients Receiving Chemotherapy  Today you received the following chemotherapy agents Zometa  To help prevent nausea and vomiting after your treatment, we encourage you to take your nausea medication as directed  If you develop nausea and vomiting that is not controlled by your nausea medication, call the clinic.   BELOW ARE SYMPTOMS THAT SHOULD BE REPORTED IMMEDIATELY:  *FEVER GREATER THAN 100.5 F  *CHILLS WITH OR WITHOUT FEVER  NAUSEA AND VOMITING THAT IS NOT CONTROLLED WITH YOUR NAUSEA MEDICATION  *UNUSUAL SHORTNESS OF BREATH  *UNUSUAL BRUISING OR BLEEDING  TENDERNESS IN MOUTH AND THROAT WITH OR WITHOUT PRESENCE OF ULCERS  *URINARY PROBLEMS  *BOWEL PROBLEMS  UNUSUAL RASH Items with * indicate a potential emergency and should be followed up as soon as possible.  Feel free to call the clinic should you have any questions or concerns. The clinic phone number is [phone].  Please show the CHEMO ALERT CARD at check-in to the Emergency Department and triage nurse.   

## 2017-01-02 ENCOUNTER — Other Ambulatory Visit: Payer: Self-pay | Admitting: Nurse Practitioner

## 2017-01-02 LAB — KAPPA/LAMBDA LIGHT CHAINS
IG LAMBDA FREE LIGHT CHAIN: 43.7 mg/L — AB (ref 5.7–26.3)
Ig Kappa Free Light Chain: 31.7 mg/L — ABNORMAL HIGH (ref 3.3–19.4)
Kappa/Lambda FluidC Ratio: 0.73 (ref 0.26–1.65)

## 2017-01-04 ENCOUNTER — Other Ambulatory Visit: Payer: Self-pay | Admitting: Nurse Practitioner

## 2017-01-04 ENCOUNTER — Telehealth: Payer: Self-pay | Admitting: *Deleted

## 2017-01-04 DIAGNOSIS — E876 Hypokalemia: Secondary | ICD-10-CM

## 2017-01-04 DIAGNOSIS — C9000 Multiple myeloma not having achieved remission: Secondary | ICD-10-CM

## 2017-01-04 LAB — IMMUNOFIXATION ELECTROPHORESIS
IGA/IMMUNOGLOBULIN A, SERUM: 304 mg/dL (ref 87–352)
IGM (IMMUNOGLOBIN M), SRM: 36 mg/dL (ref 26–217)
TOTAL PROTEIN: 6.4 g/dL (ref 6.0–8.5)

## 2017-01-04 MED ORDER — POTASSIUM CHLORIDE CRYS ER 20 MEQ PO TBCR: 20.0000 meq | EXTENDED_RELEASE_TABLET | Freq: Every day | ORAL | 0 refills | Status: DC

## 2017-01-04 NOTE — Telephone Encounter (Signed)
Telephone call to patient and advised lab results as directed below. Patient states she has had a few cases of diarrhea however nothing she was concerned with. She has been out of town and not following her regular diet. Patient will pick up prescription and begin K+ supplements. She will return to the office in 2 weeks for another lab appt. She knows to call this office with any concerns or questions.

## 2017-01-04 NOTE — Telephone Encounter (Signed)
-----   Message from Owens Shark, NP sent at 01/04/2017  8:55 AM EDT ----- Please let her know potassium is low. Is she having diarrhea?  Start 34meq kcl daily, repeat in 2 weeks

## 2017-01-15 ENCOUNTER — Ambulatory Visit: Payer: BC Managed Care – PPO | Admitting: Oncology

## 2017-01-15 ENCOUNTER — Telehealth: Payer: Self-pay

## 2017-01-15 ENCOUNTER — Other Ambulatory Visit (HOSPITAL_BASED_OUTPATIENT_CLINIC_OR_DEPARTMENT_OTHER): Payer: BC Managed Care – PPO

## 2017-01-15 VITALS — BP 131/70 | HR 73 | Temp 98.9°F | Resp 20 | Ht 67.0 in | Wt 209.7 lb

## 2017-01-15 DIAGNOSIS — C9001 Multiple myeloma in remission: Secondary | ICD-10-CM | POA: Diagnosis not present

## 2017-01-15 DIAGNOSIS — C9000 Multiple myeloma not having achieved remission: Secondary | ICD-10-CM

## 2017-01-15 DIAGNOSIS — M3119 Other thrombotic microangiopathy: Secondary | ICD-10-CM

## 2017-01-15 DIAGNOSIS — M311 Thrombotic microangiopathy: Secondary | ICD-10-CM

## 2017-01-15 LAB — COMPREHENSIVE METABOLIC PANEL
ALT: 15 U/L (ref 0–55)
AST: 17 U/L (ref 5–34)
Albumin: 3.3 g/dL — ABNORMAL LOW (ref 3.5–5.0)
Alkaline Phosphatase: 85 U/L (ref 40–150)
Anion Gap: 7 mEq/L (ref 3–11)
BILIRUBIN TOTAL: 0.7 mg/dL (ref 0.20–1.20)
BUN: 11 mg/dL (ref 7.0–26.0)
CO2: 25 meq/L (ref 22–29)
Calcium: 9.4 mg/dL (ref 8.4–10.4)
Chloride: 111 mEq/L — ABNORMAL HIGH (ref 98–109)
Creatinine: 1.1 mg/dL (ref 0.6–1.1)
GLUCOSE: 107 mg/dL (ref 70–140)
Potassium: 3.9 mEq/L (ref 3.5–5.1)
SODIUM: 142 meq/L (ref 136–145)
TOTAL PROTEIN: 7.6 g/dL (ref 6.4–8.3)

## 2017-01-15 LAB — CBC WITH DIFFERENTIAL/PLATELET
BASO%: 0.5 % (ref 0.0–2.0)
BASOS ABS: 0 10*3/uL (ref 0.0–0.1)
EOS%: 0.7 % (ref 0.0–7.0)
Eosinophils Absolute: 0 10*3/uL (ref 0.0–0.5)
HCT: 32.7 % — ABNORMAL LOW (ref 34.8–46.6)
HGB: 10.7 g/dL — ABNORMAL LOW (ref 11.6–15.9)
LYMPH%: 42.4 % (ref 14.0–49.7)
MCH: 29.1 pg (ref 25.1–34.0)
MCHC: 32.8 g/dL (ref 31.5–36.0)
MCV: 88.7 fL (ref 79.5–101.0)
MONO#: 0.4 10*3/uL (ref 0.1–0.9)
MONO%: 11 % (ref 0.0–14.0)
NEUT%: 45.4 % (ref 38.4–76.8)
NEUTROS ABS: 1.7 10*3/uL (ref 1.5–6.5)
PLATELETS: 224 10*3/uL (ref 145–400)
RBC: 3.68 10*6/uL — AB (ref 3.70–5.45)
RDW: 16.1 % — ABNORMAL HIGH (ref 11.2–14.5)
WBC: 3.7 10*3/uL — ABNORMAL LOW (ref 3.9–10.3)
lymph#: 1.6 10*3/uL (ref 0.9–3.3)

## 2017-01-15 LAB — LACTATE DEHYDROGENASE: LDH: 196 U/L (ref 125–245)

## 2017-01-15 NOTE — Progress Notes (Signed)
[place] OFFICE PROGRESS NOTE   Diagnosis: Multiple myeloma  INTERVAL HISTORY:   Carrie Huber returns as scheduled.  She feels well.  No recent infection.  Leg edema resolved when she decreased the amlodipine dose to 5 mg daily.  She remains off of prednisone and Revlimid. She reports proteinuria had resolved when she saw Dr. Lorrene Reid. Objective:  Vital signs in last 24 hours:  Blood pressure 131/70, pulse 73, temperature 98.9 F (37.2 C), temperature source Oral, resp. rate 20, height 5' 7" (1.702 m), weight 209 lb 11.2 oz (95.1 kg), SpO2 100 %.  Resp: Lungs clear bilaterally Cardio: Regular rate and rhythm GI: No hepatosplenomegaly Vascular: The left lower leg is slightly larger than the right side, no edema   Lab Results:  Lab Results  Component Value Date   WBC 3.7 (L) 01/15/2017   HGB 10.7 (L) 01/15/2017   HCT 32.7 (L) 01/15/2017   MCV 88.7 01/15/2017   PLT 224 01/15/2017   NEUTROABS 1.7 01/15/2017    CMP     Component Value Date/Time   NA 142 01/01/2017 0933   K 3.2 (L) 01/01/2017 0933   CL 104 08/17/2016 0830   CL 110 (H) 06/12/2012 0842   CO2 25 01/01/2017 0933   GLUCOSE 117 01/01/2017 0933   GLUCOSE 94 06/12/2012 0842   BUN 12.9 01/01/2017 0933   CREATININE 0.9 01/01/2017 0933   CALCIUM 9.2 01/01/2017 0933   PROT 6.4 01/01/2017 0933   PROT 6.9 01/01/2017 0933   ALBUMIN 3.2 (L) 01/01/2017 0933   AST 15 01/01/2017 0933   ALT 11 01/01/2017 0933   ALKPHOS 76 01/01/2017 0933   BILITOT 0.74 01/01/2017 0933   GFRNONAA 38 (L) 08/17/2016 0830   GFRAA 44 (L) 08/17/2016 0830   The age 66  01/01/2017: IgG 1202, kappa free light chains 31.7, lambda free light chains 43.7  Medications: I have reviewed the patient's current medications.  Assessment/Plan: 1. Multiple myeloma, IgG kappa, status post 7 cycles of Revlimid/Decadron and Velcade with marked clinical and laboratory improvement. She is status post melphalan conditioning, followed by  an autologous stem cell infusion 09/10/2008. She began maintenance Revlimid 01/04/2009. She has persistent mild elevation of the kappa and lambda light chains.  Revlimid placed on hold at time of TTP diagnosis  08/04/2016 SPEP with no M spike observed; stable mild elevation of lambda light chains  09/26/2016 mild elevation of lambda light chains, IgG in normal range 2. Neutropenia while on Revlimid at a dose of 10 mg daily. The Revlimid dose was reduced to 5 mg in September 2011 due to persistent neutropenia and a hospital admission with pneumonia/sepsis. . 3. Admission 11/03/2009 with left lung pneumonia and sepsis syndrome. No specific organism was cultured during the hospital admission. She completed outpatient Levaquin therapy.  4. 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  5. Thoracic spine compression fracture, status post a kyphoplasty 12/05/2007. A metastatic bone survey was unchanged on04/19/2018. 6. Tiny pulmonary embolism on a CT 11/20/2007, now maintained off of Coumadin.  7. Chronic tachycardia on repeat office visits at the [facility].  8. History of hypertension. 9. 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  Prednisone started 08/07/2016   Prednisone taper to 40 mg daily beginning 08/14/2016  Plasmapheresis 08/15/2016  Plasmapheresis 08/17/2016  Prednisone taper to 30 mg daily beginning 08/21/2016  Prednisone tapered to 20 mg daily beginning 09/03/2016  Prednisone tapered to 15 mg daily 09/11/2016  Prednisone tapered to 10 mg daily 09/26/2016  Prednisone tapered to 5 mg daily beginning 11/02/2016  Prednisone tapered to 2.5 mg daily for 7 days  beginning 11/28/2016, then 2.5 mg every other day for 5 doses, then discontinue  10. Altered mental status-most likely secondary to TTP,resolved  11. Proteinuria on 24-hour urine 08/04/2016 (2.3 grams).Seen by Dr. Lorrene Reid during hospitalization June 2018.Renal ultrasound 08/09/2016 with no acute abnormality seen. She is followed by Dr. Lorrene Reid.    Disposition: Carrie Huber appears stable.  There is no evidence for recurrence of the TTP.  She remains in clinical remission from myeloma.  We will follow-up on the myeloma panel from today.  She will return for an office visit and Zometa in 2 months.  She will contact us in the interim for new symptoms.  15 minutes were spent with the patient today.  The majority of the time was used for counseling and coordination of care.  Betsy Coder, MD  01/15/2017  8:55 AM

## 2017-01-15 NOTE — Telephone Encounter (Signed)
Per 11/13 los printed avs and calender for upcoming appointment

## 2017-01-15 NOTE — Telephone Encounter (Signed)
Printed avs and calender for upcoming appointment. Zometa was not in the los, but was added after speaking with Md. Sherrill on the phone

## 2017-01-16 ENCOUNTER — Telehealth: Payer: Self-pay

## 2017-01-16 LAB — KAPPA/LAMBDA LIGHT CHAINS
IG KAPPA FREE LIGHT CHAIN: 32.4 mg/L — AB (ref 3.3–19.4)
IG LAMBDA FREE LIGHT CHAIN: 40.1 mg/L — AB (ref 5.7–26.3)
KAPPA/LAMBDA FLC RATIO: 0.81 (ref 0.26–1.65)

## 2017-01-16 LAB — IGG

## 2017-01-16 NOTE — Telephone Encounter (Signed)
Called and verified appointment date, and time. Per 11/14 los

## 2017-01-17 ENCOUNTER — Telehealth: Payer: Self-pay

## 2017-01-17 NOTE — Telephone Encounter (Signed)
Pt called that on 10/30 her K+ was 3.2 and she was rx potassium 20 meq daily. On 11/13 her K+ was 3.9. She is asking if she needs to continue K+, or reduce dose, or stop?  S/w Dr Benay Spice and pt may stop potassium if not on diuretic.  Called pt. She is not on diuretic. Instructed her to stop potassium. Also to eat potassium rich foods. Discussed doing google search for foods additional to bananas.

## 2017-03-18 ENCOUNTER — Other Ambulatory Visit: Payer: BC Managed Care – PPO

## 2017-03-18 ENCOUNTER — Ambulatory Visit: Payer: BC Managed Care – PPO | Admitting: Nurse Practitioner

## 2017-03-19 ENCOUNTER — Encounter: Payer: Self-pay | Admitting: Nurse Practitioner

## 2017-03-19 ENCOUNTER — Inpatient Hospital Stay: Payer: BC Managed Care – PPO

## 2017-03-19 ENCOUNTER — Inpatient Hospital Stay: Payer: BC Managed Care – PPO | Attending: Nurse Practitioner | Admitting: Nurse Practitioner

## 2017-03-19 ENCOUNTER — Other Ambulatory Visit: Payer: BC Managed Care – PPO

## 2017-03-19 ENCOUNTER — Telehealth: Payer: Self-pay | Admitting: Nurse Practitioner

## 2017-03-19 VITALS — BP 131/70 | HR 72 | Temp 97.7°F | Resp 18 | Ht 67.0 in | Wt 215.7 lb

## 2017-03-19 DIAGNOSIS — C9000 Multiple myeloma not having achieved remission: Secondary | ICD-10-CM

## 2017-03-19 DIAGNOSIS — Z87898 Personal history of other specified conditions: Secondary | ICD-10-CM | POA: Insufficient documentation

## 2017-03-19 DIAGNOSIS — C9001 Multiple myeloma in remission: Secondary | ICD-10-CM | POA: Insufficient documentation

## 2017-03-19 DIAGNOSIS — I10 Essential (primary) hypertension: Secondary | ICD-10-CM | POA: Insufficient documentation

## 2017-03-19 LAB — CBC WITH DIFFERENTIAL/PLATELET
BASOS ABS: 0 10*3/uL (ref 0.0–0.1)
Basophils Relative: 0 %
Eosinophils Absolute: 0.1 10*3/uL (ref 0.0–0.5)
Eosinophils Relative: 1 %
HEMATOCRIT: 32.6 % — AB (ref 34.8–46.6)
HEMOGLOBIN: 10.4 g/dL — AB (ref 11.6–15.9)
LYMPHS ABS: 1.8 10*3/uL (ref 0.9–3.3)
LYMPHS PCT: 43 %
MCH: 28.8 pg (ref 25.1–34.0)
MCHC: 31.9 g/dL (ref 31.5–36.0)
MCV: 90.3 fL (ref 79.5–101.0)
Monocytes Absolute: 0.3 10*3/uL (ref 0.1–0.9)
Monocytes Relative: 8 %
NEUTROS ABS: 2 10*3/uL (ref 1.5–6.5)
NEUTROS PCT: 48 %
PLATELETS: 162 10*3/uL (ref 145–400)
RBC: 3.61 MIL/uL — AB (ref 3.70–5.45)
RDW: 16.3 % — ABNORMAL HIGH (ref 11.2–16.1)
WBC: 4.2 10*3/uL (ref 3.9–10.3)

## 2017-03-19 LAB — COMPREHENSIVE METABOLIC PANEL
ALT: 15 U/L (ref 0–55)
ANION GAP: 7 (ref 3–11)
AST: 16 U/L (ref 5–34)
Albumin: 3.3 g/dL — ABNORMAL LOW (ref 3.5–5.0)
Alkaline Phosphatase: 87 U/L (ref 40–150)
BILIRUBIN TOTAL: 0.6 mg/dL (ref 0.2–1.2)
BUN: 16 mg/dL (ref 7–26)
CHLORIDE: 109 mmol/L (ref 98–109)
CO2: 25 mmol/L (ref 22–29)
Calcium: 9.1 mg/dL (ref 8.4–10.4)
Creatinine, Ser: 1.04 mg/dL (ref 0.60–1.10)
GFR calc Af Amer: >60 mL/min (ref >60)
GFR, EST NON AFRICAN AMERICAN: 56 mL/min — AB (ref >60)
Glucose, Bld: 119 mg/dL (ref 70–140)
Potassium: 3.6 mmol/L (ref 3.3–4.7)
Sodium: 141 mmol/L (ref 136–145)
TOTAL PROTEIN: 7.2 g/dL (ref 6.4–8.3)

## 2017-03-19 LAB — LACTATE DEHYDROGENASE: LDH: 176 U/L (ref 125–245)

## 2017-03-19 MED ORDER — ZOLEDRONIC ACID 4 MG/100ML IV SOLN
4.0000 mg | Freq: Once | INTRAVENOUS | Status: AC
  Administered 2017-03-19: 4 mg via INTRAVENOUS
  Filled 2017-03-19: qty 100

## 2017-03-19 NOTE — Telephone Encounter (Signed)
Scheduled appt per 1/15 los - Pt is aware of appts - no calender or avs printed per patient request. - my chart active.

## 2017-03-19 NOTE — Patient Instructions (Signed)
[facility] Discharge Instructions for Patients Receiving Chemotherapy  Today you received the following chemotherapy agents: Zometa  To help prevent nausea and vomiting after your treatment, we encourage you to take your nausea medication as prescribed.    If you develop nausea and vomiting that is not controlled by your nausea medication, call the clinic.   BELOW ARE SYMPTOMS THAT SHOULD BE REPORTED IMMEDIATELY:  *FEVER GREATER THAN 100.5 F  *CHILLS WITH OR WITHOUT FEVER  NAUSEA AND VOMITING THAT IS NOT CONTROLLED WITH YOUR NAUSEA MEDICATION  *UNUSUAL SHORTNESS OF BREATH  *UNUSUAL BRUISING OR BLEEDING  TENDERNESS IN MOUTH AND THROAT WITH OR WITHOUT PRESENCE OF ULCERS  *URINARY PROBLEMS  *BOWEL PROBLEMS  UNUSUAL RASH Items with * indicate a potential emergency and should be followed up as soon as possible.  Feel free to call the clinic should you have any questions or concerns. The clinic phone number is [phone].  Please show the CHEMO ALERT CARD at check-in to the Emergency Department and triage nurse.   

## 2017-03-19 NOTE — Progress Notes (Signed)
[place] OFFICE PROGRESS NOTE   Diagnosis: Multiple myeloma, TTP  INTERVAL HISTORY:   Carrie Huber returns as scheduled.  Overall she is feeling well.  No interim illnesses or infections.  She has good appetite.  She denies pain.  Leg swelling has improved since discontinuing amlodipine.  Objective:  Vital signs in last 24 hours:  Blood pressure 131/70, pulse 72, temperature 97.7 F (36.5 C), temperature source Oral, resp. rate 18, height '5\' 7"'  (1.702 m), weight 215 lb 11.2 oz (97.8 kg), SpO2 100 %.    HEENT: No thrush or ulcers. Resp: Lungs clear bilaterally. Cardio: Regular rate and rhythm. GI: Abdomen soft and nontender.  No hepatosplenomegaly. Vascular: Trace lower leg/ankle edema bilaterally left greater than right.    Lab Results:  Lab Results  Component Value Date   WBC 4.2 03/19/2017   HGB 10.4 (L) 03/19/2017   HCT 32.6 (L) 03/19/2017   MCV 90.3 03/19/2017   PLT 162 03/19/2017   NEUTROABS 2.0 03/19/2017    Imaging:  No results found.  Medications: I have reviewed the patient's current medications.  Assessment/Plan: 1. Multiple myeloma, IgG kappa, status post 7 cycles of Revlimid/Decadron and Velcade with marked clinical and laboratory improvement. She is status post melphalan conditioning, followed by an autologous stem cell infusion 09/10/2008. She began maintenance Revlimid 01/04/2009. She has persistent mild elevation of the kappa and lambda light chains.  Revlimid placed on hold at time of TTP diagnosis  08/04/2016 SPEP with no M spike observed; stable mild elevation of lambda light chains  09/26/2016 mild elevation of lambda light chains, IgG in normal range 2. Neutropenia while on Revlimid at a dose of 10 mg daily. The Revlimid dose was reduced to 5 mg in September 2011 due to persistent neutropenia and a hospital admission with pneumonia/sepsis. . 3. Admission 11/03/2009 with left lung pneumonia and sepsis syndrome. No specific  organism was cultured during the hospital admission. She completed outpatient Levaquin therapy.  4. 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  5. Thoracic spine compression fracture, status post a kyphoplasty 12/05/2007. A metastatic bone survey was unchanged on04/19/2018. 6. Tiny pulmonary embolism on a CT 11/20/2007, now maintained off of Coumadin.  7. Chronic tachycardia on repeat office visits at the [facility].  8. History of hypertension. 9. 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  Prednisone started 08/07/2016   Prednisone taper to 40 mg daily beginning 08/14/2016  Plasmapheresis 08/15/2016  Plasmapheresis 08/17/2016  Prednisone taper to 30 mg daily beginning 08/21/2016  Prednisone tapered to 20 mg daily beginning 09/03/2016  Prednisone tapered to 15 mg daily 09/11/2016  Prednisone tapered to 10 mg daily 09/26/2016  Prednisone taperedto 5 mg daily beginning 11/02/2016  Prednisone tapered to 2.5 mg daily for 7 days beginning 11/28/2016, then 2.5 mg every other day for 5 doses, then discontinued  10. Altered mental status-most likely secondary to TTP,resolved  11. Proteinuria on 24-hour urine 08/04/2016 (2.3 grams).Seen by Dr. Lorrene Reid during hospitalization June 2018.Renal ultrasound 08/09/2016 with no acute abnormality seen. She is followed by Dr. Lorrene Reid.    Disposition: [name] made stable from a hematologic standpoint with no evidence of recurrence of the TTP.  She remains in clinical remission from multiple myeloma.  We will follow-up on the outstanding myeloma  labs from today.  She is scheduled to receive Zometa today.  She will return for  labs, follow-up and Zometa in 3 months.    Carrie Huber ANP/GNP-BC   03/19/2017  10:07 AM

## 2017-03-20 LAB — KAPPA/LAMBDA LIGHT CHAINS
KAPPA FREE LGHT CHN: 32 mg/L — AB (ref 3.3–19.4)
Kappa, lambda light chain ratio: 0.9 (ref 0.26–1.65)
LAMDA FREE LIGHT CHAINS: 35.7 mg/L — AB (ref 5.7–26.3)

## 2017-03-20 LAB — IGG: IgG (Immunoglobin G), Serum: 1312 mg/dL (ref 700–1600)

## 2017-03-21 LAB — PROTEIN ELECTROPHORESIS, SERUM
A/G RATIO SPE: 1.1 (ref 0.7–1.7)
ALBUMIN ELP: 3.5 g/dL (ref 2.9–4.4)
Alpha-1-Globulin: 0.2 g/dL (ref 0.0–0.4)
Alpha-2-Globulin: 0.8 g/dL (ref 0.4–1.0)
Beta Globulin: 1 g/dL (ref 0.7–1.3)
Gamma Globulin: 1.3 g/dL (ref 0.4–1.8)
Globulin, Total: 3.3 g/dL (ref 2.2–3.9)
TOTAL PROTEIN ELP: 6.8 g/dL (ref 6.0–8.5)

## 2017-06-11 ENCOUNTER — Inpatient Hospital Stay (HOSPITAL_BASED_OUTPATIENT_CLINIC_OR_DEPARTMENT_OTHER): Payer: BC Managed Care – PPO | Admitting: Oncology

## 2017-06-11 ENCOUNTER — Inpatient Hospital Stay: Payer: BC Managed Care – PPO | Attending: Nurse Practitioner

## 2017-06-11 ENCOUNTER — Inpatient Hospital Stay: Payer: BC Managed Care – PPO

## 2017-06-11 ENCOUNTER — Telehealth: Payer: Self-pay | Admitting: Oncology

## 2017-06-11 VITALS — BP 164/80 | HR 74 | Temp 98.5°F | Resp 17 | Ht 67.0 in | Wt 219.7 lb

## 2017-06-11 DIAGNOSIS — C9000 Multiple myeloma not having achieved remission: Secondary | ICD-10-CM | POA: Diagnosis not present

## 2017-06-11 DIAGNOSIS — C9001 Multiple myeloma in remission: Secondary | ICD-10-CM

## 2017-06-11 LAB — CBC WITH DIFFERENTIAL (CANCER CENTER ONLY)
BASOS ABS: 0 10*3/uL (ref 0.0–0.1)
Basophils Relative: 0 %
Eosinophils Absolute: 0.1 10*3/uL (ref 0.0–0.5)
Eosinophils Relative: 2 %
HCT: 35.2 % (ref 34.8–46.6)
Hemoglobin: 11.1 g/dL — ABNORMAL LOW (ref 11.6–15.9)
LYMPHS ABS: 2.6 10*3/uL (ref 0.9–3.3)
Lymphocytes Relative: 59 %
MCH: 28 pg (ref 25.1–34.0)
MCHC: 31.5 g/dL (ref 31.5–36.0)
MCV: 88.7 fL (ref 79.5–101.0)
MONOS PCT: 9 %
Monocytes Absolute: 0.4 10*3/uL (ref 0.1–0.9)
NEUTROS ABS: 1.3 10*3/uL — AB (ref 1.5–6.5)
NEUTROS PCT: 30 %
PLATELETS: 172 10*3/uL (ref 145–400)
RBC: 3.97 MIL/uL (ref 3.70–5.45)
RDW: 16.6 % — AB (ref 11.2–14.5)
WBC Count: 4.5 10*3/uL (ref 3.9–10.3)

## 2017-06-11 LAB — CMP (CANCER CENTER ONLY)
ALT: 13 U/L (ref 0–55)
ANION GAP: 9 (ref 3–11)
AST: 17 U/L (ref 5–34)
Albumin: 3.3 g/dL — ABNORMAL LOW (ref 3.5–5.0)
Alkaline Phosphatase: 80 U/L (ref 40–150)
BUN: 15 mg/dL (ref 7–26)
CHLORIDE: 111 mmol/L — AB (ref 98–109)
CO2: 22 mmol/L (ref 22–29)
Calcium: 9.6 mg/dL (ref 8.4–10.4)
Creatinine: 1.14 mg/dL — ABNORMAL HIGH (ref 0.60–1.10)
GFR, EST AFRICAN AMERICAN: 58 mL/min — AB (ref >60)
GFR, Estimated: 50 mL/min — ABNORMAL LOW (ref >60)
Glucose, Bld: 104 mg/dL (ref 70–140)
Potassium: 3.8 mmol/L (ref 3.5–5.1)
SODIUM: 142 mmol/L (ref 136–145)
Total Bilirubin: 0.5 mg/dL (ref 0.2–1.2)
Total Protein: 7.2 g/dL (ref 6.4–8.3)

## 2017-06-11 LAB — LACTATE DEHYDROGENASE: LDH: 179 U/L (ref 125–245)

## 2017-06-11 MED ORDER — ZOLEDRONIC ACID 4 MG/100ML IV SOLN
4.0000 mg | Freq: Once | INTRAVENOUS | Status: AC
Start: 2017-06-11 — End: 2017-06-11
  Administered 2017-06-11: 4 mg via INTRAVENOUS
  Filled 2017-06-11: qty 100

## 2017-06-11 MED ORDER — SODIUM CHLORIDE 0.9 % IV SOLN
Freq: Once | INTRAVENOUS | Status: AC
  Administered 2017-06-11: 10:00:00 via INTRAVENOUS

## 2017-06-11 NOTE — Patient Instructions (Signed)
Zoledronic Acid injection (Hypercalcemia, Oncology) (Zometa) What is this medicine? ZOLEDRONIC ACID (ZOE le dron ik AS id) lowers the amount of calcium loss from bone. It is used to treat too much calcium in your blood from cancer. It is also used to prevent complications of cancer that has spread to the bone. This medicine may be used for other purposes; ask your health care provider or pharmacist if you have questions. What should I tell my health care provider before I take this medicine? They need to know if you have any of these conditions: -aspirin-sensitive asthma -cancer, especially if you are receiving medicines used to treat cancer -dental disease or wear dentures -infection -kidney disease -receiving corticosteroids like dexamethasone or prednisone -an unusual or allergic reaction to zoledronic acid, other medicines, foods, dyes, or preservatives -pregnant or trying to get pregnant -breast-feeding How should I use this medicine? This medicine is for infusion into a vein. It is given by a health care professional in a hospital or clinic setting. Talk to your pediatrician regarding the use of this medicine in children. Special care may be needed. Overdosage: If you think you have taken too much of this medicine contact a poison control center or emergency room at once. NOTE: This medicine is only for you. Do not share this medicine with others. What if I miss a dose? It is important not to miss your dose. Call your doctor or health care professional if you are unable to keep an appointment. What may interact with this medicine? -certain antibiotics given by injection -NSAIDs, medicines for pain and inflammation, like ibuprofen or naproxen -some diuretics like bumetanide, furosemide -teriparatide -thalidomide This list may not describe all possible interactions. Give your health care provider a list of all the medicines, herbs, non-prescription drugs, or dietary supplements you  use. Also tell them if you smoke, drink alcohol, or use illegal drugs. Some items may interact with your medicine. What should I watch for while using this medicine? Visit your doctor or health care professional for regular checkups. It may be some time before you see the benefit from this medicine. Do not stop taking your medicine unless your doctor tells you to. Your doctor may order blood tests or other tests to see how you are doing. Women should inform their doctor if they wish to become pregnant or think they might be pregnant. There is a potential for serious side effects to an unborn child. Talk to your health care professional or pharmacist for more information. You should make sure that you get enough calcium and vitamin D while you are taking this medicine. Discuss the foods you eat and the vitamins you take with your health care professional. Some people who take this medicine have severe bone, joint, and/or muscle pain. This medicine may also increase your risk for jaw problems or a broken thigh bone. Tell your doctor right away if you have severe pain in your jaw, bones, joints, or muscles. Tell your doctor if you have any pain that does not go away or that gets worse. Tell your dentist and dental surgeon that you are taking this medicine. You should not have major dental surgery while on this medicine. See your dentist to have a dental exam and fix any dental problems before starting this medicine. Take good care of your teeth while on this medicine. Make sure you see your dentist for regular follow-up appointments. What side effects may I notice from receiving this medicine? Side effects that you should report   to your doctor or health care professional as soon as possible: -allergic reactions like skin rash, itching or hives, swelling of the face, lips, or tongue -anxiety, confusion, or depression -breathing problems -changes in vision -eye pain -feeling faint or lightheaded,  falls -jaw pain, especially after dental work -mouth sores -muscle cramps, stiffness, or weakness -redness, blistering, peeling or loosening of the skin, including inside the mouth -trouble passing urine or change in the amount of urine Side effects that usually do not require medical attention (report to your doctor or health care professional if they continue or are bothersome): -bone, joint, or muscle pain -constipation -diarrhea -fever -hair loss -irritation at site where injected -loss of appetite -nausea, vomiting -stomach upset -trouble sleeping -trouble swallowing -weak or tired This list may not describe all possible side effects. Call your doctor for medical advice about side effects. You may report side effects to FDA at 1-800-FDA-1088. Where should I keep my medicine? This drug is given in a hospital or clinic and will not be stored at home. NOTE: This sheet is a summary. It may not cover all possible information. If you have questions about this medicine, talk to your doctor, pharmacist, or health care provider.    2016, Elsevier/Gold Standard. (2013-07-18 14:19:39)  

## 2017-06-11 NOTE — Progress Notes (Signed)
[place] OFFICE PROGRESS NOTE   Diagnosis: Multiple myeloma  INTERVAL HISTORY:   Carrie Huber returns as scheduled.  She feels well.  No pain.  No recent infection.  Objective:  Vital signs in last 24 hours:  Blood pressure (!) 164/80, pulse 74, temperature 98.5 F (36.9 C), temperature source Oral, resp. rate 17, height _0  (1.702 m), weight 219 lb 11.2 oz (99.7 kg), SpO2 100 %.   Resp: Lungs clear bilaterally Cardio: Regular rate and rhythm GI: No hepatosplenomegaly Vascular: The left lower leg is slightly larger than the right side with trace edema at the lower leg and ankle bilaterally  Lab Results: WBC 4.5, hemoglobin 11.1, MCV 88.7, platelets 172,000, ANC 1.3  Potassium 3.8, BUN 15, creatinine 1.18, calcium 9.6, albumin 3.3   Medications: I have reviewed the patient's current medications.   Assessment/Plan: 1. Multiple myeloma, IgG kappa, status post 7 cycles of Revlimid/Decadron and Velcade with marked clinical and laboratory improvement. She is status post melphalan conditioning, followed by an autologous stem cell infusion 09/10/2008. She began maintenance Revlimid 01/04/2009. She has persistent mild elevation of the kappa and lambda light chains.  Revlimid placed on hold at time of TTP diagnosis  08/04/2016 SPEP with no M spike observed; stable mild elevation of lambda light chains  09/26/2016 mild elevation of lambda light chains, IgG in normal range 2. Neutropenia while on Revlimid at a dose of 10 mg daily. The Revlimid dose was reduced to 5 mg in September 2011 due to persistent neutropenia and a hospital admission with pneumonia/sepsis. . 3. Admission 11/03/2009 with left lung pneumonia and sepsis syndrome. No specific organism was cultured during the hospital admission. She completed outpatient Levaquin therapy.  4. 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  5. Thoracic spine  compression fracture, status post a kyphoplasty 12/05/2007. A metastatic bone survey was unchanged on04/19/2018. 6. Tiny pulmonary embolism on a CT 11/20/2007, now maintained off of Coumadin.  7. Chronic tachycardia on repeat office visits at the [facility].  8. History of hypertension. 9. 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  Prednisone started 08/07/2016   Prednisone taper to 40 mg daily beginning 08/14/2016  Plasmapheresis 08/15/2016  Plasmapheresis 08/17/2016  Prednisone taper to 30 mg daily beginning 08/21/2016  Prednisone tapered to 20 mg daily beginning 09/03/2016  Prednisone tapered to 15 mg daily 09/11/2016  Prednisone tapered to 10 mg daily 09/26/2016  Prednisone taperedto 5 mg daily beginning 11/02/2016  Prednisone tapered to 2.5 mg daily for 7 days beginning 11/28/2016, then 2.5 mg every other day for 5 doses, then discontinued  10. Altered mental status-most likely secondary to TTP,resolved  11. Proteinuria on 24-hour urine 08/04/2016 (2.3 grams).Seen by Dr. Lorrene Reid during hospitalization June 2018.Renal ultrasound 08/09/2016 with no acute abnormality seen. She is followed by Dr. Lorrene Reid.   Disposition: Carrie Huber appears stable.  She continues every 11-monthZometa.  We will refer her for a surveillance bone survey.  We will follow-up on the myeloma panel from today. Ms. BDunbarwill return for an office and lab visit in 3 months.  15 minutes were spent with the patient today.  The majority of the time was used for counseling and coordination of care.  GBetsy Coder MD  06/11/2017  8:56 AM

## 2017-06-11 NOTE — Telephone Encounter (Signed)
Appts scheduled AVS/Calendar printed/ radiology number given for scheduling per 4/9 los

## 2017-06-12 LAB — KAPPA/LAMBDA LIGHT CHAINS
KAPPA, LAMDA LIGHT CHAIN RATIO: 0.88 (ref 0.26–1.65)
Kappa free light chain: 30.9 mg/L — ABNORMAL HIGH (ref 3.3–19.4)
Lambda free light chains: 35 mg/L — ABNORMAL HIGH (ref 5.7–26.3)

## 2017-06-12 LAB — IGG: IGG (IMMUNOGLOBIN G), SERUM: 1253 mg/dL (ref 700–1600)

## 2017-06-13 LAB — PROTEIN ELECTROPHORESIS, SERUM
A/G Ratio: 0.9 (ref 0.7–1.7)
ALBUMIN ELP: 3.1 g/dL (ref 2.9–4.4)
Alpha-1-Globulin: 0.2 g/dL (ref 0.0–0.4)
Alpha-2-Globulin: 0.9 g/dL (ref 0.4–1.0)
BETA GLOBULIN: 1.2 g/dL (ref 0.7–1.3)
Gamma Globulin: 1.2 g/dL (ref 0.4–1.8)
Globulin, Total: 3.5 g/dL (ref 2.2–3.9)
Total Protein ELP: 6.6 g/dL (ref 6.0–8.5)

## 2017-09-09 ENCOUNTER — Ambulatory Visit (HOSPITAL_COMMUNITY)
Admission: RE | Admit: 2017-09-09 | Discharge: 2017-09-09 | Disposition: A | Discharge status: Home / Self Care | Payer: BC Managed Care – PPO | Source: Ambulatory Visit | Attending: Oncology | Admitting: Oncology

## 2017-09-09 ENCOUNTER — Inpatient Hospital Stay: Payer: BC Managed Care – PPO | Attending: Oncology

## 2017-09-09 DIAGNOSIS — C9000 Multiple myeloma not having achieved remission: Secondary | ICD-10-CM | POA: Insufficient documentation

## 2017-09-09 DIAGNOSIS — I10 Essential (primary) hypertension: Secondary | ICD-10-CM | POA: Insufficient documentation

## 2017-09-09 DIAGNOSIS — C9001 Multiple myeloma in remission: Secondary | ICD-10-CM

## 2017-09-09 DIAGNOSIS — M50322 Other cervical disc degeneration at C5-C6 level: Secondary | ICD-10-CM | POA: Insufficient documentation

## 2017-09-09 DIAGNOSIS — M4856XA Collapsed vertebra, not elsewhere classified, lumbar region, initial encounter for fracture: Secondary | ICD-10-CM | POA: Diagnosis not present

## 2017-09-09 DIAGNOSIS — M4854XA Collapsed vertebra, not elsewhere classified, thoracic region, initial encounter for fracture: Secondary | ICD-10-CM | POA: Insufficient documentation

## 2017-09-09 LAB — CBC WITH DIFFERENTIAL (CANCER CENTER ONLY)
BASOS PCT: 0 %
Basophils Absolute: 0 10*3/uL (ref 0.0–0.1)
EOS ABS: 0.1 10*3/uL (ref 0.0–0.5)
EOS PCT: 2 %
HCT: 33.7 % — ABNORMAL LOW (ref 34.8–46.6)
Hemoglobin: 10.8 g/dL — ABNORMAL LOW (ref 11.6–15.9)
LYMPHS ABS: 2 10*3/uL (ref 0.9–3.3)
Lymphocytes Relative: 51 %
MCH: 28.5 pg (ref 25.1–34.0)
MCHC: 32 g/dL (ref 31.5–36.0)
MCV: 88.9 fL (ref 79.5–101.0)
MONO ABS: 0.4 10*3/uL (ref 0.1–0.9)
MONOS PCT: 9 %
Neutro Abs: 1.5 10*3/uL (ref 1.5–6.5)
Neutrophils Relative %: 38 %
PLATELETS: 156 10*3/uL (ref 145–400)
RBC: 3.79 MIL/uL (ref 3.70–5.45)
RDW: 17.7 % — AB (ref 11.2–14.5)
WBC Count: 3.9 10*3/uL (ref 3.9–10.3)

## 2017-09-09 LAB — BASIC METABOLIC PANEL - CANCER CENTER ONLY
Anion gap: 5 (ref 5–15)
BUN: 15 mg/dL (ref 8–23)
CALCIUM: 9.7 mg/dL (ref 8.9–10.3)
CHLORIDE: 109 mmol/L (ref 98–111)
CO2: 26 mmol/L (ref 22–32)
CREATININE: 1.08 mg/dL — AB (ref 0.44–1.00)
GFR, Estimated: 53 mL/min — ABNORMAL LOW (ref >60)
Glucose, Bld: 118 mg/dL — ABNORMAL HIGH (ref 70–99)
Potassium: 3.8 mmol/L (ref 3.5–5.1)
Sodium: 140 mmol/L (ref 135–145)

## 2017-09-10 LAB — PROTEIN ELECTROPHORESIS, SERUM
A/G RATIO SPE: 1 (ref 0.7–1.7)
ALPHA-2-GLOBULIN: 0.8 g/dL (ref 0.4–1.0)
Albumin ELP: 3.3 g/dL (ref 2.9–4.4)
Alpha-1-Globulin: 0.2 g/dL (ref 0.0–0.4)
BETA GLOBULIN: 1 g/dL (ref 0.7–1.3)
GAMMA GLOBULIN: 1.2 g/dL (ref 0.4–1.8)
Globulin, Total: 3.2 g/dL (ref 2.2–3.9)
Total Protein ELP: 6.5 g/dL (ref 6.0–8.5)

## 2017-09-10 LAB — IGG, IGA, IGM
IGA: 364 mg/dL — AB (ref 87–352)
IgG (Immunoglobin G), Serum: 1262 mg/dL (ref 700–1600)
IgM (Immunoglobulin M), Srm: 38 mg/dL (ref 26–217)

## 2017-09-10 LAB — KAPPA/LAMBDA LIGHT CHAINS
Kappa free light chain: 32.6 mg/L — ABNORMAL HIGH (ref 3.3–19.4)
Kappa, lambda light chain ratio: 1.11 (ref 0.26–1.65)
Lambda free light chains: 29.5 mg/L — ABNORMAL HIGH (ref 5.7–26.3)

## 2017-09-11 ENCOUNTER — Telehealth: Payer: Self-pay | Admitting: Oncology

## 2017-09-11 ENCOUNTER — Inpatient Hospital Stay (HOSPITAL_BASED_OUTPATIENT_CLINIC_OR_DEPARTMENT_OTHER): Payer: BC Managed Care – PPO | Admitting: Nurse Practitioner

## 2017-09-11 ENCOUNTER — Inpatient Hospital Stay: Payer: BC Managed Care – PPO

## 2017-09-11 ENCOUNTER — Encounter: Payer: Self-pay | Admitting: Nurse Practitioner

## 2017-09-11 VITALS — BP 155/85 | HR 73 | Temp 99.0°F | Resp 18 | Wt 218.3 lb

## 2017-09-11 DIAGNOSIS — C9000 Multiple myeloma not having achieved remission: Secondary | ICD-10-CM

## 2017-09-11 DIAGNOSIS — I10 Essential (primary) hypertension: Secondary | ICD-10-CM

## 2017-09-11 DIAGNOSIS — C9001 Multiple myeloma in remission: Secondary | ICD-10-CM

## 2017-09-11 MED ORDER — SODIUM CHLORIDE 0.9 % IV SOLN
Freq: Once | INTRAVENOUS | Status: AC
  Administered 2017-09-11: 12:00:00 via INTRAVENOUS

## 2017-09-11 MED ORDER — ZOLEDRONIC ACID 4 MG/100ML IV SOLN
4.0000 mg | Freq: Once | INTRAVENOUS | Status: AC
  Administered 2017-09-11: 4 mg via INTRAVENOUS
  Filled 2017-09-11: qty 100

## 2017-09-11 NOTE — Progress Notes (Signed)
[place] OFFICE PROGRESS NOTE   Diagnosis: Multiple myeloma  INTERVAL HISTORY:   Ms. Cleek returns as scheduled.  She overall feels well.  No interim illnesses or infections.  No bowel or bladder problems.  She thinks the left leg edema is better.  Objective:  Vital signs in last 24 hours:  Blood pressure (!) 155/85, pulse 73, temperature 99 F (37.2 C), resp. rate 18, weight 218 lb 5 oz (99 kg), SpO2 100 %.    HEENT: No thrush or ulcers. Resp: Lungs clear bilaterally. Cardio: Regular rate and rhythm. GI: Abdomen soft and nontender.  No hepatospleno megaly. Vascular: Trace edema at the lower legs bilaterally.  Left lower leg is slightly larger than the right lower leg.   Lab Results:  Lab Results  Component Value Date   WBC 3.9 09/09/2017   HGB 10.8 (L) 09/09/2017   HCT 33.7 (L) 09/09/2017   MCV 88.9 09/09/2017   PLT 156 09/09/2017   NEUTROABS 1.5 09/09/2017    Imaging:  Dg Bone Survey Met  Result Date: 09/09/2017 CLINICAL DATA:  67 year old with current history of multiple myeloma, diagnosed in 2009. Personal history of autologous bone-marrow transplant in 2010. Prior thoracic compression fractures with augmentation. Restaging evaluation. EXAM: METASTATIC BONE SURVEY COMPARISON:  Bone survey evaluations 06/21/2016, 12/03/2014 dating back to 12/07/2010. FINDINGS: The following bones were imaged: Lateral skull: Solitary lytic focus involving the frontal bone, less conspicuous than on the prior examinations. No new lytic lesions. Cervical spine, AP and lateral views: Moderate disc space narrowing and associated endplate hypertrophic changes at C5-6. Remaining disc spaces well-preserved. Normal prevertebral soft tissues. No lytic lesions to indicate myeloma. Thoracic spine, AP and lateral views: Osseous demineralization and moth-eaten appearance of the vertebral bodies as noted previously. Remote compression fractures of the T8, T9, T10, T11 and T12 vertebral  bodies with prior augmentation at T10 and T12, unchanged. No new thoracic compression fractures. Lumbar spine, AP and lateral views: Osseous demineralization and moth-eaten appearance of the vertebral bodies as noted previously. Remote compression fractures of the UPPER endplates of L1, L2, L3 and L4, unchanged. No new lumbar compression fractures. Well-preserved disc spaces. Posterior element hypertrophy from L3-4 through L5-S1. Chest, PA view: Cardiac silhouette normal in size, unchanged. Thoracic aorta mildly tortuous and atherosclerotic. Hilar and mediastinal contours otherwise unremarkable. Lungs clear. Bronchovascular markings normal. Pulmonary vascularity normal. No visible pleural effusions. No pneumothorax. No convincing myeloma involving the ribs. Right shoulder, AP view: No lytic lesions identified involving the clavicle or scapula. Glenohumeral joint anatomically aligned with well-preserved joint space. Subacromial space well-preserved. Acromioclavicular joint intact. Right humerus, AP view: Lytic lesion involving the proximal humeral metaphysis as noted previously. No new lytic lesions. Left shoulder, AP view: No lytic lesions identified involving the clavicle or scapula. Glenohumeral joint anatomically aligned with well-preserved joint space. Subacromial space well-preserved. Acromioclavicular joint intact. Left humerus, AP view: Endosteal scalloping along the LATERAL proximal metaphysis as noted previously. No new lytic lesions. Right forearm, AP view: No lytic lesions or endosteal scalloping. No intrinsic osseous abnormality. Left forearm, AP view: No lytic lesions or endosteal scalloping. No intrinsic osseous abnormality. AP pelvis and bilateral hips: Moth-eaten appearance of the entire pelvis and the proximal femurs as noted previously. Degenerative sclerosis involving the pubic symphysis, unchanged. Sacroiliac joints intact. Right femur, AP view: Lytic lesions involving the proximal metaphysis.  Endosteal scalloping involving the mid-diaphysis as noted previously. No new lytic lesions. Left femur, AP view: Mild endosteal scalloping involving the mid-diaphysis as noted previously.  No new lytic lesions. Right tibia-fibula, AP view: No lytic lesions or endosteal scalloping. No intrinsic osseous abnormality. Left tibia-fibula, AP view: No lytic lesions or endosteal scalloping. No intrinsic osseous abnormality. IMPRESSION: 1. Stable findings of multiple myeloma involving the axial and appendicular skeleton. 2. No new osseous abnormalities. 3. Stable multiple thoracic and lumbar spine compression fractures. 4. Stable moderate degenerative disc disease at C5-6. 5.  No acute cardiopulmonary disease. Electronically Signed   By: Evangeline Dakin M.D.   On: 09/09/2017 15:35    Medications: I have reviewed the patient's current medications.  Assessment/Plan: 1. Multiple myeloma, IgG kappa, status post 7 cycles of Revlimid/Decadron and Velcade with marked clinical and laboratory improvement. She is status post melphalan conditioning, followed by an autologous stem cell infusion 09/10/2008. She began maintenance Revlimid 01/04/2009. She has persistent mild elevation of the kappa and lambda light chains.  Revlimid placed on hold at time of TTP diagnosis  08/04/2016 SPEP with no M spike observed; stable mild elevation of lambda light chains  09/26/2016 mild elevation of lambda light chains, IgG in normal range  Bone survey 09/09/2017- stable findings of multiple myeloma.  No new osseous abnormalities.  Stable multiple thoracic and lumbar spine compression fractures.  Stable moderate degenerative disc disease at C5-6. 2. Neutropenia while on Revlimid at a dose of 10 mg daily. The Revlimid dose was reduced to 5 mg in September 2011 due to persistent neutropenia and a hospital admission with pneumonia/sepsis. . 3. Admission 11/03/2009 with left lung pneumonia and sepsis syndrome. No specific organism was  cultured during the hospital admission. She completed outpatient Levaquin therapy.  4. 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  5. Thoracic spine compression fracture, status post a kyphoplasty 12/05/2007. A metastatic bone survey was unchanged on04/19/2018. 6. Tiny pulmonary embolism on a CT 11/20/2007, now maintained off of Coumadin.  7. Chronic tachycardia on repeat office visits at the [facility]  8. History of hypertension. 9. 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  Prednisone started 08/07/2016   Prednisone taper to 40 mg daily beginning 08/14/2016  Plasmapheresis 08/15/2016  Plasmapheresis 08/17/2016  Prednisone taper to 30 mg daily beginning 08/21/2016  Prednisone tapered to 20 mg daily beginning 09/03/2016  Prednisone tapered to 15 mg daily 09/11/2016  Prednisone tapered to 10 mg daily 09/26/2016  Prednisone taperedto 5 mg daily beginning 11/02/2016  Prednisone tapered to 2.5 mg daily for 7 days beginning 11/28/2016, then 2.5 mg every other day for 5 doses, then discontinued  10. Altered mental status-most likely secondary to TTP,resolved  11. Proteinuria on 24-hour urine 08/04/2016 (2.3 grams).Seen by Dr. Lorrene Reid during hospitalization June 2018.Renal ultrasound 08/09/2016 with no acute abnormality seen. She is followed by Dr. Lorrene Reid.    Disposition: Ms. Rafferty appears stable.  We reviewed the lab and bone survey results from 09/09/2017.  There have been no significant changes.  Plan to continue every 3 month Zometa, infusion today.  She will return for lab, follow-up and Zometa in 3 months.  She will contact the office in the interim with  any problems.   Ned Card ANP/GNP-BC   09/11/2017  11:23 AM

## 2017-09-11 NOTE — Telephone Encounter (Signed)
Appointments scheduled patient declined AVS/Calendar per 7/10 los

## 2017-09-11 NOTE — Patient Instructions (Signed)
[facility] Discharge Instructions for Patients Receiving Chemotherapy  Today you received the following chemotherapy agents Zometa  To help prevent nausea and vomiting after your treatment, we encourage you to take your nausea medication as directed  If you develop nausea and vomiting that is not controlled by your nausea medication, call the clinic.   BELOW ARE SYMPTOMS THAT SHOULD BE REPORTED IMMEDIATELY:  *FEVER GREATER THAN 100.5 F  *CHILLS WITH OR WITHOUT FEVER  NAUSEA AND VOMITING THAT IS NOT CONTROLLED WITH YOUR NAUSEA MEDICATION  *UNUSUAL SHORTNESS OF BREATH  *UNUSUAL BRUISING OR BLEEDING  TENDERNESS IN MOUTH AND THROAT WITH OR WITHOUT PRESENCE OF ULCERS  *URINARY PROBLEMS  *BOWEL PROBLEMS  UNUSUAL RASH Items with * indicate a potential emergency and should be followed up as soon as possible.  Feel free to call the clinic should you have any questions or concerns. The clinic phone number is [phone].  Please show the CHEMO ALERT CARD at check-in to the Emergency Department and triage nurse.   

## 2017-09-11 NOTE — Telephone Encounter (Signed)
Appointments scheduled AVS/Calendar declined per 7/10 los

## 2017-12-12 ENCOUNTER — Inpatient Hospital Stay: Payer: BC Managed Care – PPO | Attending: Oncology

## 2017-12-12 ENCOUNTER — Inpatient Hospital Stay: Payer: BC Managed Care – PPO

## 2017-12-12 ENCOUNTER — Telehealth: Payer: Self-pay | Admitting: Oncology

## 2017-12-12 ENCOUNTER — Inpatient Hospital Stay (HOSPITAL_BASED_OUTPATIENT_CLINIC_OR_DEPARTMENT_OTHER): Payer: BC Managed Care – PPO | Admitting: Oncology

## 2017-12-12 VITALS — BP 151/83 | HR 70 | Temp 98.6°F | Resp 18 | Ht 67.0 in | Wt 218.5 lb

## 2017-12-12 DIAGNOSIS — C9001 Multiple myeloma in remission: Secondary | ICD-10-CM

## 2017-12-12 DIAGNOSIS — Z23 Encounter for immunization: Secondary | ICD-10-CM | POA: Diagnosis not present

## 2017-12-12 DIAGNOSIS — C9000 Multiple myeloma not having achieved remission: Secondary | ICD-10-CM | POA: Insufficient documentation

## 2017-12-12 LAB — CMP (CANCER CENTER ONLY)
ALBUMIN: 3.6 g/dL (ref 3.5–5.0)
ALT: 18 U/L (ref 0–44)
ANION GAP: 9 (ref 5–15)
AST: 27 U/L (ref 15–41)
Alkaline Phosphatase: 89 U/L (ref 38–126)
BUN: 14 mg/dL (ref 8–23)
CHLORIDE: 107 mmol/L (ref 98–111)
CO2: 25 mmol/L (ref 22–32)
Calcium: 9.8 mg/dL (ref 8.9–10.3)
Creatinine: 1.08 mg/dL — ABNORMAL HIGH (ref 0.44–1.00)
GFR, Est AFR Am: >60 mL/min (ref >60)
GFR, Estimated: 53 mL/min — ABNORMAL LOW (ref >60)
GLUCOSE: 110 mg/dL — AB (ref 70–99)
POTASSIUM: 4.2 mmol/L (ref 3.5–5.1)
Sodium: 141 mmol/L (ref 135–145)
TOTAL PROTEIN: 7.3 g/dL (ref 6.5–8.1)
Total Bilirubin: 0.9 mg/dL (ref 0.3–1.2)

## 2017-12-12 LAB — CBC WITH DIFFERENTIAL (CANCER CENTER ONLY)
ABS IMMATURE GRANULOCYTES: 0 10*3/uL (ref 0.00–0.07)
BASOS PCT: 0 %
Basophils Absolute: 0 10*3/uL (ref 0.0–0.1)
Eosinophils Absolute: 0.1 10*3/uL (ref 0.0–0.5)
Eosinophils Relative: 2 %
HCT: 36.3 % (ref 36.0–46.0)
Hemoglobin: 11.2 g/dL — ABNORMAL LOW (ref 12.0–15.0)
IMMATURE GRANULOCYTES: 0 %
LYMPHS PCT: 55 %
Lymphs Abs: 2.6 10*3/uL (ref 0.7–4.0)
MCH: 27.9 pg (ref 26.0–34.0)
MCHC: 30.9 g/dL (ref 30.0–36.0)
MCV: 90.3 fL (ref 80.0–100.0)
MONOS PCT: 11 %
Monocytes Absolute: 0.5 10*3/uL (ref 0.1–1.0)
NEUTROS ABS: 1.5 10*3/uL — AB (ref 1.7–7.7)
Neutrophils Relative %: 32 %
PLATELETS: 172 10*3/uL (ref 150–400)
RBC: 4.02 MIL/uL (ref 3.87–5.11)
RDW: 16.3 % — ABNORMAL HIGH (ref 11.5–15.5)
WBC Count: 4.8 10*3/uL (ref 4.0–10.5)
nRBC: 0 % (ref 0.0–0.2)

## 2017-12-12 MED ORDER — ZOLEDRONIC ACID 4 MG/100ML IV SOLN
4.0000 mg | Freq: Once | INTRAVENOUS | Status: AC
  Administered 2017-12-12: 4 mg via INTRAVENOUS
  Filled 2017-12-12: qty 100

## 2017-12-12 MED ORDER — INFLUENZA VAC SPLIT QUAD 0.5 ML IM SUSY
PREFILLED_SYRINGE | INTRAMUSCULAR | Status: AC
  Filled 2017-12-12: qty 0.5

## 2017-12-12 MED ORDER — INFLUENZA VAC SPLIT QUAD 0.5 ML IM SUSY
0.5000 mL | PREFILLED_SYRINGE | Freq: Once | INTRAMUSCULAR | Status: AC
  Administered 2017-12-12: 0.5 mL via INTRAMUSCULAR

## 2017-12-12 NOTE — Patient Instructions (Signed)

## 2017-12-12 NOTE — Telephone Encounter (Signed)
Scheduled appt per 10/10 los - sent reminder letter in the mail with appt date and time .

## 2017-12-12 NOTE — Progress Notes (Signed)
[place] OFFICE PROGRESS NOTE   Diagnosis: Multiple myeloma  INTERVAL HISTORY:   Carrie Huber returns for a scheduled visit.  She last received Zometa in July.  She feels well.  No complaint.  Objective:  Vital signs in last 24 hours:  Blood pressure (!) 151/83, pulse 70, temperature 98.6 F (37 C), temperature source Oral, resp. rate 18, height _0  (1.702 m), weight 218 lb 8 oz (99.1 kg), SpO2 97 %.     Resp: Lungs clear bilaterally Cardio: Regular rate and rhythm GI: No hepatosplenomegaly Vascular: Trace edema at the left lower leg and ankle   Lab Results:  Lab Results  Component Value Date   WBC 4.8 12/12/2017   HGB 11.2 (L) 12/12/2017   HCT 36.3 12/12/2017   MCV 90.3 12/12/2017   PLT 172 12/12/2017   NEUTROABS 1.5 (L) 12/12/2017    CMP  Lab Results  Component Value Date   NA 141 12/12/2017   K 4.2 12/12/2017   CL 107 12/12/2017   CO2 25 12/12/2017   GLUCOSE 110 (H) 12/12/2017   BUN 14 12/12/2017   CREATININE 1.08 (H) 12/12/2017   CALCIUM 9.8 12/12/2017   PROT 7.3 12/12/2017   ALBUMIN 3.6 12/12/2017   AST 27 12/12/2017   ALT 18 12/12/2017   ALKPHOS 89 12/12/2017   BILITOT 0.9 12/12/2017   GFRNONAA 53 (L) 12/12/2017   GFRAA >60 12/12/2017     Medications: I have reviewed the patient's current medications.   1. Multiple myeloma, IgG kappa, status post 7 cycles of Revlimid/Decadron and Velcade with marked clinical and laboratory improvement. She is status post melphalan conditioning, followed by an autologous stem cell infusion 09/10/2008. She began maintenance Revlimid 01/04/2009. She has persistent mild elevation of the kappa and lambda light chains.  Revlimid placed on hold at time of TTP diagnosis  08/04/2016 SPEP with no M spike observed; stable mild elevation of lambda light chains  09/26/2016 mild elevation of lambda light chains, IgG in normal range  Bone survey 09/09/2017- stable findings of multiple myeloma.  No new  osseous abnormalities.  Stable multiple thoracic and lumbar spine compression fractures.  Stable moderate degenerative disc disease at C5-6. 2. Neutropenia while on Revlimid at a dose of 10 mg daily. The Revlimid dose was reduced to 5 mg in September 2011 due to persistent neutropenia and a hospital admission with pneumonia/sepsis. . 3. Admission 11/03/2009 with left lung pneumonia and sepsis syndrome. No specific organism was cultured during the hospital admission. She completed outpatient Levaquin therapy.  4. 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  5. Thoracic spine compression fracture, status post a kyphoplasty 12/05/2007. A metastatic bone survey was unchanged on04/19/2018. 6. Tiny pulmonary embolism on a CT 11/20/2007, now maintained off of Coumadin.  7. Chronic tachycardia on repeat office visits at the [facility].  8. History of hypertension. 9. 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  Prednisone started 08/07/2016   Prednisone taper to 40 mg daily beginning 08/14/2016  Plasmapheresis 08/15/2016  Plasmapheresis 08/17/2016  Prednisone taper to 30 mg daily beginning 08/21/2016  Prednisone tapered to 20 mg daily beginning 09/03/2016  Prednisone tapered to 15 mg daily 09/11/2016  Prednisone tapered to 10 mg daily 09/26/2016  Prednisone taperedto 5 mg daily beginning 11/02/2016  Prednisone tapered to 2.5 mg daily for 7 days beginning 11/28/2016, then 2.5 mg every other day for 5 doses, then discontinued  10. Altered mental status-most likely secondary to TTP,resolved  11. Proteinuria on 24-hour urine 08/04/2016 (2.3 grams).Seen by Dr. Lorrene Reid during  hospitalization June 2018.Renal ultrasound 08/09/2016 with no acute abnormality seen. She is followed by Dr. Lorrene Reid.   Disposition: [name] appears stable.  Is no clinical evidence for progression of multiple myeloma.  We will follow-up on the myeloma panel from today.  She will receive Zometa today.  She will return for an office visit and Zometa in 3 months.  Ms. Falwell received an influenza vaccine today.    Betsy Coder, MD  12/12/2017  9:56 AM

## 2017-12-13 LAB — PROTEIN ELECTROPHORESIS, SERUM
A/G Ratio: 1.1 (ref 0.7–1.7)
ALPHA-1-GLOBULIN: 0.2 g/dL (ref 0.0–0.4)
ALPHA-2-GLOBULIN: 0.8 g/dL (ref 0.4–1.0)
Albumin ELP: 3.4 g/dL (ref 2.9–4.4)
Beta Globulin: 0.9 g/dL (ref 0.7–1.3)
GLOBULIN, TOTAL: 3.2 g/dL (ref 2.2–3.9)
Gamma Globulin: 1.3 g/dL (ref 0.4–1.8)
TOTAL PROTEIN ELP: 6.6 g/dL (ref 6.0–8.5)

## 2017-12-13 LAB — IGG, IGA, IGM
IGA: 370 mg/dL — AB (ref 87–352)
IgG (Immunoglobin G), Serum: 1303 mg/dL (ref 700–1600)
IgM (Immunoglobulin M), Srm: 53 mg/dL (ref 26–217)

## 2017-12-16 LAB — KAPPA/LAMBDA LIGHT CHAINS
Kappa free light chain: 39.5 mg/L — ABNORMAL HIGH (ref 3.3–19.4)
Kappa, lambda light chain ratio: 1.19 (ref 0.26–1.65)
LAMDA FREE LIGHT CHAINS: 33.3 mg/L — AB (ref 5.7–26.3)

## 2018-01-27 ENCOUNTER — Other Ambulatory Visit: Payer: Self-pay | Admitting: Family Medicine

## 2018-01-27 ENCOUNTER — Other Ambulatory Visit (HOSPITAL_COMMUNITY)
Admission: RE | Admit: 2018-01-27 | Discharge: 2018-01-27 | Disposition: A | Discharge status: Home / Self Care | Payer: BC Managed Care – PPO | Source: Ambulatory Visit | Attending: Family Medicine | Admitting: Family Medicine

## 2018-01-27 DIAGNOSIS — Z124 Encounter for screening for malignant neoplasm of cervix: Secondary | ICD-10-CM | POA: Insufficient documentation

## 2018-01-29 LAB — CYTOLOGY - PAP: Diagnosis: NEGATIVE

## 2018-03-10 ENCOUNTER — Telehealth: Payer: Self-pay | Admitting: Oncology

## 2018-03-10 NOTE — Telephone Encounter (Signed)
R/s appt from 1/10 to later time in the day per GBS on PAL - patient is aware of time change

## 2018-03-14 ENCOUNTER — Inpatient Hospital Stay (HOSPITAL_BASED_OUTPATIENT_CLINIC_OR_DEPARTMENT_OTHER): Payer: BC Managed Care – PPO | Admitting: Nurse Practitioner

## 2018-03-14 ENCOUNTER — Inpatient Hospital Stay: Payer: BC Managed Care – PPO

## 2018-03-14 ENCOUNTER — Encounter: Payer: Self-pay | Admitting: Nurse Practitioner

## 2018-03-14 ENCOUNTER — Ambulatory Visit: Payer: BC Managed Care – PPO | Admitting: Oncology

## 2018-03-14 ENCOUNTER — Ambulatory Visit: Payer: BC Managed Care – PPO

## 2018-03-14 ENCOUNTER — Other Ambulatory Visit: Payer: BC Managed Care – PPO

## 2018-03-14 ENCOUNTER — Inpatient Hospital Stay: Payer: BC Managed Care – PPO | Attending: Oncology

## 2018-03-14 VITALS — BP 154/91 | HR 71 | Temp 97.5°F | Resp 17 | Ht 67.0 in | Wt 218.3 lb

## 2018-03-14 DIAGNOSIS — R000 Tachycardia, unspecified: Secondary | ICD-10-CM | POA: Diagnosis not present

## 2018-03-14 DIAGNOSIS — C9001 Multiple myeloma in remission: Secondary | ICD-10-CM

## 2018-03-14 DIAGNOSIS — C9000 Multiple myeloma not having achieved remission: Secondary | ICD-10-CM | POA: Diagnosis present

## 2018-03-14 LAB — BASIC METABOLIC PANEL - CANCER CENTER ONLY
Anion gap: 8 (ref 5–15)
BUN: 17 mg/dL (ref 8–23)
CO2: 24 mmol/L (ref 22–32)
Calcium: 9.7 mg/dL (ref 8.9–10.3)
Chloride: 108 mmol/L (ref 98–111)
Creatinine: 1.11 mg/dL — ABNORMAL HIGH (ref 0.44–1.00)
GFR, Est AFR Am: >60 mL/min (ref >60)
GFR, Estimated: 52 mL/min — ABNORMAL LOW (ref >60)
GLUCOSE: 95 mg/dL (ref 70–99)
Potassium: 4 mmol/L (ref 3.5–5.1)
Sodium: 140 mmol/L (ref 135–145)

## 2018-03-14 LAB — CBC WITH DIFFERENTIAL (CANCER CENTER ONLY)
Abs Immature Granulocytes: 0.03 10*3/uL (ref 0.00–0.07)
Basophils Absolute: 0 10*3/uL (ref 0.0–0.1)
Basophils Relative: 0 %
Eosinophils Absolute: 0.1 10*3/uL (ref 0.0–0.5)
Eosinophils Relative: 2 %
HCT: 37.2 % (ref 36.0–46.0)
Hemoglobin: 11.7 g/dL — ABNORMAL LOW (ref 12.0–15.0)
Immature Granulocytes: 1 %
Lymphocytes Relative: 58 %
Lymphs Abs: 3 10*3/uL (ref 0.7–4.0)
MCH: 27.9 pg (ref 26.0–34.0)
MCHC: 31.5 g/dL (ref 30.0–36.0)
MCV: 88.8 fL (ref 80.0–100.0)
Monocytes Absolute: 0.4 10*3/uL (ref 0.1–1.0)
Monocytes Relative: 9 %
Neutro Abs: 1.5 10*3/uL — ABNORMAL LOW (ref 1.7–7.7)
Neutrophils Relative %: 30 %
PLATELETS: 191 10*3/uL (ref 150–400)
RBC: 4.19 MIL/uL (ref 3.87–5.11)
RDW: 17 % — ABNORMAL HIGH (ref 11.5–15.5)
WBC Count: 5.1 10*3/uL (ref 4.0–10.5)
nRBC: 0 % (ref 0.0–0.2)

## 2018-03-14 MED ORDER — ZOLEDRONIC ACID 4 MG/100ML IV SOLN
4.0000 mg | Freq: Once | INTRAVENOUS | Status: AC
  Administered 2018-03-14: 4 mg via INTRAVENOUS
  Filled 2018-03-14: qty 100

## 2018-03-14 NOTE — Progress Notes (Signed)
[facility] OFFICE PROGRESS NOTE   Diagnosis: Multiple myeloma  INTERVAL HISTORY:   Carrie Huber returns as scheduled.  She feels well.  No interim illnesses or infections.  No fevers or sweats.  She has a good appetite.  No pain.  No dental issues.  Objective:  Vital signs in last 24 hours:  Blood pressure (!) 154/91, pulse 71, temperature (!) 97.5 F (36.4 C), temperature source Oral, resp. rate 17, height '5\' 7"'$  (1.702 m), weight 218 lb 4.8 oz (99 kg), SpO2 99 %.    HEENT: No thrush or ulcers. Lymphatics: No palpable cervical, supraclavicular or axillary lymph nodes. Resp: Lungs clear bilaterally. Cardio: Regular rate and rhythm. GI: Abdomen soft and nontender.  No hepatosplenomegaly. Vascular: Trace edema left lower leg.    Lab Results:  Lab Results  Component Value Date   WBC 5.1 03/14/2018   HGB 11.7 (L) 03/14/2018   HCT 37.2 03/14/2018   MCV 88.8 03/14/2018   PLT 191 03/14/2018   NEUTROABS 1.5 (L) 03/14/2018    Imaging:  No results found.  Medications: I have reviewed the patient's current medications.  Assessment/Plan: 1. Multiple myeloma, IgG kappa, status post 7 cycles of Revlimid/Decadron and Velcade with marked clinical and laboratory improvement. She is status post melphalan conditioning, followed by an autologous stem cell infusion 09/10/2008. She began maintenance Revlimid 01/04/2009. She has persistent mild elevation of the kappa and lambda light chains.  Revlimid placed on hold at time of TTP diagnosis  08/04/2016 SPEP with no M spike observed; stable mild elevation of lambda light chains  09/26/2016 mild elevation of lambda light chains, IgG in normal range  Bone survey 09/09/2017- stable findings of multiple myeloma. No new osseous abnormalities. Stable multiple thoracic and lumbar spine compression fractures. Stable moderate degenerative disc disease at C5-6. 2. Neutropenia while on Revlimid at a dose of 10 mg daily. The  Revlimid dose was reduced to 5 mg in September 2011 due to persistent neutropenia and a hospital admission with pneumonia/sepsis. . 3. Admission 11/03/2009 with left lung pneumonia and sepsis syndrome. No specific organism was cultured during the hospital admission. She completed outpatient Levaquin therapy.  4. 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  5. Thoracic spine compression fracture, status post a kyphoplasty 12/05/2007. A metastatic bone survey was unchanged on04/19/2018. 6. Tiny pulmonary embolism on a CT 11/20/2007, now maintained off of Coumadin.  7. Chronic tachycardia on repeat office visits at the [facility].  8. History of hypertension. 9. 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  Prednisone started 08/07/2016   Prednisone taper to 40 mg daily beginning 08/14/2016  Plasmapheresis 08/15/2016  Plasmapheresis 08/17/2016  Prednisone taper to 30 mg daily beginning 08/21/2016  Prednisone tapered to 20 mg daily beginning 09/03/2016  Prednisone tapered to 15 mg daily 09/11/2016  Prednisone tapered to 10 mg daily 09/26/2016  Prednisone taperedto 5 mg daily beginning 11/02/2016  Prednisone tapered to 2.5 mg daily for 7 days beginning 11/28/2016, then 2.5 mg every other day for 5 doses, then discontinued  10. Altered mental status-most likely secondary to TTP,resolved  11. Proteinuria on 24-hour urine 08/04/2016 (2.3 grams).Seen by Dr. Lorrene Reid during hospitalization June 2018.Renal ultrasound 08/09/2016 with no acute abnormality seen. She is followed by Dr. Lorrene Reid.    Disposition:  [name] appears stable.  There is no clinical evidence for  progression of multiple myeloma.  We will follow-up on the myeloma labs from today.  She will receive a Zometa infusion today.  She will return for lab, follow-up and Zometa in 3 months.  She will contact the office in the interim with any problems.    Ned Card ANP/GNP-BC   03/14/2018  3:21 PM

## 2018-03-15 LAB — IGG, IGA, IGM
IgA: 441 mg/dL — ABNORMAL HIGH (ref 87–352)
IgG (Immunoglobin G), Serum: 1527 mg/dL (ref 700–1600)
IgM (Immunoglobulin M), Srm: 61 mg/dL (ref 26–217)

## 2018-03-17 ENCOUNTER — Telehealth: Payer: Self-pay | Admitting: Nurse Practitioner

## 2018-03-17 LAB — PROTEIN ELECTROPHORESIS, SERUM
A/G Ratio: 1.1 (ref 0.7–1.7)
Albumin ELP: 4 g/dL (ref 2.9–4.4)
Alpha-1-Globulin: 0.2 g/dL (ref 0.0–0.4)
Alpha-2-Globulin: 0.9 g/dL (ref 0.4–1.0)
Beta Globulin: 1.1 g/dL (ref 0.7–1.3)
Gamma Globulin: 1.5 g/dL (ref 0.4–1.8)
Globulin, Total: 3.6 g/dL (ref 2.2–3.9)
TOTAL PROTEIN ELP: 7.6 g/dL (ref 6.0–8.5)

## 2018-03-17 LAB — KAPPA/LAMBDA LIGHT CHAINS
Kappa free light chain: 33.5 mg/L — ABNORMAL HIGH (ref 3.3–19.4)
Kappa, lambda light chain ratio: 0.98 (ref 0.26–1.65)
Lambda free light chains: 34.1 mg/L — ABNORMAL HIGH (ref 5.7–26.3)

## 2018-03-17 NOTE — Telephone Encounter (Signed)
Scheduled appt per 1/10 los - sent reminder letter in the mail with appt date and time   

## 2018-06-03 ENCOUNTER — Telehealth: Payer: Self-pay | Admitting: *Deleted

## 2018-06-03 NOTE — Telephone Encounter (Signed)
Called to cancel her appointments on 06/05/18 and would like to reschedule for May. Scheduling message sent.

## 2018-06-04 ENCOUNTER — Telehealth: Payer: Self-pay | Admitting: Oncology

## 2018-06-04 NOTE — Telephone Encounter (Signed)
R/s appt per 3/31 sch message - pt aware of appt date and time

## 2018-06-05 ENCOUNTER — Ambulatory Visit: Payer: BC Managed Care – PPO

## 2018-06-05 ENCOUNTER — Other Ambulatory Visit: Payer: BC Managed Care – PPO

## 2018-06-05 ENCOUNTER — Ambulatory Visit: Payer: BC Managed Care – PPO | Admitting: Oncology

## 2018-07-18 ENCOUNTER — Ambulatory Visit: Payer: BC Managed Care – PPO | Admitting: Oncology

## 2018-07-18 ENCOUNTER — Ambulatory Visit: Payer: BC Managed Care – PPO

## 2018-07-18 ENCOUNTER — Other Ambulatory Visit: Payer: BC Managed Care – PPO

## 2018-08-12 ENCOUNTER — Inpatient Hospital Stay: Payer: BC Managed Care – PPO

## 2018-08-12 ENCOUNTER — Other Ambulatory Visit: Payer: Self-pay

## 2018-08-12 ENCOUNTER — Inpatient Hospital Stay: Payer: BC Managed Care – PPO | Attending: Oncology | Admitting: Oncology

## 2018-08-12 ENCOUNTER — Telehealth: Payer: Self-pay | Admitting: Oncology

## 2018-08-12 VITALS — BP 163/88 | HR 74 | Temp 98.7°F | Resp 17 | Ht 67.0 in | Wt 221.0 lb

## 2018-08-12 VITALS — BP 165/86

## 2018-08-12 DIAGNOSIS — C9000 Multiple myeloma not having achieved remission: Secondary | ICD-10-CM

## 2018-08-12 DIAGNOSIS — I10 Essential (primary) hypertension: Secondary | ICD-10-CM | POA: Diagnosis not present

## 2018-08-12 DIAGNOSIS — C9001 Multiple myeloma in remission: Secondary | ICD-10-CM

## 2018-08-12 LAB — CBC WITH DIFFERENTIAL (CANCER CENTER ONLY)
Abs Immature Granulocytes: 0.01 10*3/uL (ref 0.00–0.07)
Basophils Absolute: 0 10*3/uL (ref 0.0–0.1)
Basophils Relative: 0 %
Eosinophils Absolute: 0.1 10*3/uL (ref 0.0–0.5)
Eosinophils Relative: 2 %
HCT: 37.1 % (ref 36.0–46.0)
Hemoglobin: 11.6 g/dL — ABNORMAL LOW (ref 12.0–15.0)
Immature Granulocytes: 0 %
Lymphocytes Relative: 55 %
Lymphs Abs: 2.8 10*3/uL (ref 0.7–4.0)
MCH: 28 pg (ref 26.0–34.0)
MCHC: 31.3 g/dL (ref 30.0–36.0)
MCV: 89.4 fL (ref 80.0–100.0)
Monocytes Absolute: 0.6 10*3/uL (ref 0.1–1.0)
Monocytes Relative: 11 %
Neutro Abs: 1.7 10*3/uL (ref 1.7–7.7)
Neutrophils Relative %: 32 %
Platelet Count: 191 10*3/uL (ref 150–400)
RBC: 4.15 MIL/uL (ref 3.87–5.11)
RDW: 16.7 % — ABNORMAL HIGH (ref 11.5–15.5)
WBC Count: 5.2 10*3/uL (ref 4.0–10.5)
nRBC: 0 % (ref 0.0–0.2)

## 2018-08-12 LAB — CMP (CANCER CENTER ONLY)
ALT: 13 U/L (ref 0–44)
AST: 18 U/L (ref 15–41)
Albumin: 3.5 g/dL (ref 3.5–5.0)
Alkaline Phosphatase: 83 U/L (ref 38–126)
Anion gap: 11 (ref 5–15)
BUN: 13 mg/dL (ref 8–23)
CO2: 21 mmol/L — ABNORMAL LOW (ref 22–32)
Calcium: 9.4 mg/dL (ref 8.9–10.3)
Chloride: 110 mmol/L (ref 98–111)
Creatinine: 1.1 mg/dL — ABNORMAL HIGH (ref 0.44–1.00)
GFR, Est AFR Am: >60 mL/min (ref >60)
GFR, Estimated: 52 mL/min — ABNORMAL LOW (ref >60)
Glucose, Bld: 120 mg/dL — ABNORMAL HIGH (ref 70–99)
Potassium: 3.8 mmol/L (ref 3.5–5.1)
Sodium: 142 mmol/L (ref 135–145)
Total Bilirubin: 0.8 mg/dL (ref 0.3–1.2)
Total Protein: 7.6 g/dL (ref 6.5–8.1)

## 2018-08-12 MED ORDER — SODIUM CHLORIDE 0.9 % IV SOLN
Freq: Once | INTRAVENOUS | Status: AC
  Administered 2018-08-12: 10:00:00 via INTRAVENOUS
  Filled 2018-08-12: qty 250

## 2018-08-12 MED ORDER — ZOLEDRONIC ACID 4 MG/100ML IV SOLN
4.0000 mg | Freq: Once | INTRAVENOUS | Status: AC
  Administered 2018-08-12: 4 mg via INTRAVENOUS
  Filled 2018-08-12: qty 100

## 2018-08-12 NOTE — Patient Instructions (Addendum)
Per Dr. Benay Spice: OK to infuse Zometa every 6 months.  Zoledronic Acid injection (Hypercalcemia, Oncology) What is this medicine? ZOLEDRONIC ACID (ZOE le dron ik AS id) lowers the amount of calcium loss from bone. It is used to treat too much calcium in your blood from cancer. It is also used to prevent complications of cancer that has spread to the bone. This medicine may be used for other purposes; ask your health care provider or pharmacist if you have questions. COMMON BRAND NAME(S): Zometa What should I tell my health care provider before I take this medicine? They need to know if you have any of these conditions: -aspirin-sensitive asthma -cancer, especially if you are receiving medicines used to treat cancer -dental disease or wear dentures -infection -kidney disease -receiving corticosteroids like dexamethasone or prednisone -an unusual or allergic reaction to zoledronic acid, other medicines, foods, dyes, or preservatives -pregnant or trying to get pregnant -breast-feeding How should I use this medicine? This medicine is for infusion into a vein. It is given by a health care professional in a hospital or clinic setting. Talk to your pediatrician regarding the use of this medicine in children. Special care may be needed. Overdosage: If you think you have taken too much of this medicine contact a poison control center or emergency room at once. NOTE: This medicine is only for you. Do not share this medicine with others. What if I miss a dose? It is important not to miss your dose. Call your doctor or health care professional if you are unable to keep an appointment. What may interact with this medicine? -certain antibiotics given by injection -NSAIDs, medicines for pain and inflammation, like ibuprofen or naproxen -some diuretics like bumetanide, furosemide -teriparatide -thalidomide This list may not describe all possible interactions. Give your health care provider a list of  all the medicines, herbs, non-prescription drugs, or dietary supplements you use. Also tell them if you smoke, drink alcohol, or use illegal drugs. Some items may interact with your medicine. What should I watch for while using this medicine? Visit your doctor or health care professional for regular checkups. It may be some time before you see the benefit from this medicine. Do not stop taking your medicine unless your doctor tells you to. Your doctor may order blood tests or other tests to see how you are doing. Women should inform their doctor if they wish to become pregnant or think they might be pregnant. There is a potential for serious side effects to an unborn child. Talk to your health care professional or pharmacist for more information. You should make sure that you get enough calcium and vitamin D while you are taking this medicine. Discuss the foods you eat and the vitamins you take with your health care professional. Some people who take this medicine have severe bone, joint, and/or muscle pain. This medicine may also increase your risk for jaw problems or a broken thigh bone. Tell your doctor right away if you have severe pain in your jaw, bones, joints, or muscles. Tell your doctor if you have any pain that does not go away or that gets worse. Tell your dentist and dental surgeon that you are taking this medicine. You should not have major dental surgery while on this medicine. See your dentist to have a dental exam and fix any dental problems before starting this medicine. Take good care of your teeth while on this medicine. Make sure you see your dentist for regular follow-up appointments. What side  effects may I notice from receiving this medicine? Side effects that you should report to your doctor or health care professional as soon as possible: -allergic reactions like skin rash, itching or hives, swelling of the face, lips, or tongue -anxiety, confusion, or depression -breathing  problems -changes in vision -eye pain -feeling faint or lightheaded, falls -jaw pain, especially after dental work -mouth sores -muscle cramps, stiffness, or weakness -redness, blistering, peeling or loosening of the skin, including inside the mouth -trouble passing urine or change in the amount of urine Side effects that usually do not require medical attention (report to your doctor or health care professional if they continue or are bothersome): -bone, joint, or muscle pain -constipation -diarrhea -fever -hair loss -irritation at site where injected -loss of appetite -nausea, vomiting -stomach upset -trouble sleeping -trouble swallowing -weak or tired This list may not describe all possible side effects. Call your doctor for medical advice about side effects. You may report side effects to FDA at 1-800-FDA-1088. Where should I keep my medicine? This drug is given in a hospital or clinic and will not be stored at home. NOTE: This sheet is a summary. It may not cover all possible information. If you have questions about this medicine, talk to your doctor, pharmacist, or health care provider.  2019 Elsevier/Gold Standard (2013-07-18 14:19:39)

## 2018-08-12 NOTE — Progress Notes (Signed)
[facility] OFFICE PROGRESS NOTE   Diagnosis: Multiple myeloma  INTERVAL HISTORY:   [name] returns as scheduled.  She feels well.  No pain.  No recent infection.  She is exercising.  She reports her blood pressure is normal when checked at home.  No tooth or jaw pain.  Objective:  Vital signs in last 24 hours:  Blood pressure (!) 163/88, pulse 74, temperature 98.7 F (37.1 C), temperature source Oral, resp. rate 17, height _0  (1.702 m), weight 221 lb (100.2 kg), SpO2 98 %.   Physical examination not performed today secondary to distancing with the COVID pandemic.  Lab Results:  Lab Results  Component Value Date   WBC 5.2 08/12/2018   HGB 11.6 (L) 08/12/2018   HCT 37.1 08/12/2018   MCV 89.4 08/12/2018   PLT 191 08/12/2018   NEUTROABS 1.7 08/12/2018    CMP  Lab Results  Component Value Date   NA 142 08/12/2018   K 3.8 08/12/2018   CL 110 08/12/2018   CO2 21 (L) 08/12/2018   GLUCOSE 120 (H) 08/12/2018   BUN 13 08/12/2018   CREATININE 1.10 (H) 08/12/2018   CALCIUM 9.4 08/12/2018   PROT 7.6 08/12/2018   ALBUMIN 3.5 08/12/2018   AST 18 08/12/2018   ALT 13 08/12/2018   ALKPHOS 83 08/12/2018   BILITOT 0.8 08/12/2018   GFRNONAA 52 (L) 08/12/2018   GFRAA >60 08/12/2018    No results found for: CEA1  Lab Results  Component Value Date   INR 1.12 08/04/2016    Imaging:  No results found.  Medications: I have reviewed the patient's current medications.   Assessment/Plan: 1. Multiple myeloma, IgG kappa, status post 7 cycles of Revlimid/Decadron and Velcade with marked clinical and laboratory improvement. She is status post melphalan conditioning, followed by an autologous stem cell infusion 09/10/2008. She began maintenance Revlimid 01/04/2009. She has persistent mild elevation of the kappa and lambda light chains.  Revlimid placed on hold at time of TTP diagnosis  08/04/2016 SPEP with no M spike observed; stable mild elevation of lambda  light chains  09/26/2016 mild elevation of lambda light chains, IgG in normal range  Bone survey 09/09/2017- stable findings of multiple myeloma. No new osseous abnormalities. Stable multiple thoracic and lumbar spine compression fractures. Stable moderate degenerative disc disease at C5-6. 2. Neutropenia while on Revlimid at a dose of 10 mg daily. The Revlimid dose was reduced to 5 mg in September 2011 due to persistent neutropenia and a hospital admission with pneumonia/sepsis. . 3. Admission 11/03/2009 with left lung pneumonia and sepsis syndrome. No specific organism was cultured during the hospital admission. She completed outpatient Levaquin therapy.  4. 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  5. Thoracic spine compression fracture, status post a kyphoplasty 12/05/2007. A metastatic bone survey was unchanged on04/19/2018. 6. Tiny pulmonary embolism on a CT 11/20/2007, now maintained off of Coumadin.  7. Chronic tachycardia on repeat office visits at the [facility].  8. History of hypertension. 9. 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  Prednisone started 08/07/2016   Prednisone taper to 40 mg daily beginning 08/14/2016  Plasmapheresis 08/15/2016  Plasmapheresis 08/17/2016  Prednisone taper to 30 mg daily beginning 08/21/2016  Prednisone tapered to 20 mg daily beginning 09/03/2016  Prednisone tapered to 15 mg daily 09/11/2016  Prednisone tapered to 10 mg daily 09/26/2016  Prednisone taperedto 5 mg daily beginning 11/02/2016  Prednisone tapered to 2.5 mg daily for 7 days beginning 11/28/2016, then 2.5 mg every other day for 5 doses, then discontinued   10. Altered mental status-most likely secondary to TTP,resolved  11. Proteinuria on 24-hour urine 08/04/2016 (2.3 grams).Seen by Dr. Lorrene Reid during hospitalization June 2018.Renal ultrasound 08/09/2016 with no acute abnormality seen. She is followed by Dr. Lorrene Reid.    Disposition: Carrie Huber remains in clinical remission from multiple myeloma and TTP.  She continuesZometa.  We will plan to change Zometa to a 86-monthinterval after today's treatment.  Ms. BDrohanwill return for an office and lab visit in 3 months.  GBetsy Coder MD  08/12/2018  9:33 AM

## 2018-08-12 NOTE — Telephone Encounter (Signed)
Scheduled appt per 6/9 los. °

## 2018-08-13 LAB — KAPPA/LAMBDA LIGHT CHAINS
Kappa free light chain: 35.2 mg/L — ABNORMAL HIGH (ref 3.3–19.4)
Kappa, lambda light chain ratio: 1.28 (ref 0.26–1.65)
Lambda free light chains: 27.4 mg/L — ABNORMAL HIGH (ref 5.7–26.3)

## 2018-08-13 LAB — IGG, IGA, IGM
IgA: 395 mg/dL — ABNORMAL HIGH (ref 87–352)
IgG (Immunoglobin G), Serum: 1387 mg/dL (ref 586–1602)
IgM (Immunoglobulin M), Srm: 55 mg/dL (ref 26–217)

## 2018-08-13 LAB — PROTEIN ELECTROPHORESIS, SERUM
A/G Ratio: 1 (ref 0.7–1.7)
Albumin ELP: 3.4 g/dL (ref 2.9–4.4)
Alpha-1-Globulin: 0.2 g/dL (ref 0.0–0.4)
Alpha-2-Globulin: 0.9 g/dL (ref 0.4–1.0)
Beta Globulin: 1 g/dL (ref 0.7–1.3)
Gamma Globulin: 1.4 g/dL (ref 0.4–1.8)
Globulin, Total: 3.5 g/dL (ref 2.2–3.9)
Total Protein ELP: 6.9 g/dL (ref 6.0–8.5)

## 2018-08-14 ENCOUNTER — Telehealth: Payer: Self-pay | Admitting: *Deleted

## 2018-08-14 NOTE — Telephone Encounter (Signed)
-----   Message from Ladell Pier, MD sent at 08/14/2018 11:53 AM EDT ----- Please call patient, myeloma labs are stable, follow-up as scheduled

## 2018-08-14 NOTE — Telephone Encounter (Signed)
Left VM for patient to call for lab results. Faxed office note and labs to [facility] as requested. Also sent Mychart message.

## 2018-10-08 IMAGING — CR DG CHEST 2V
2 series · 2 of 2 positions shown · non-contrast
Comparison: 06/21/2016

CLINICAL DATA: Weakness and fatigue x4 days. No dyspnea or chest
pain.

EXAM:
CHEST  2 VIEW

[w chest pa]
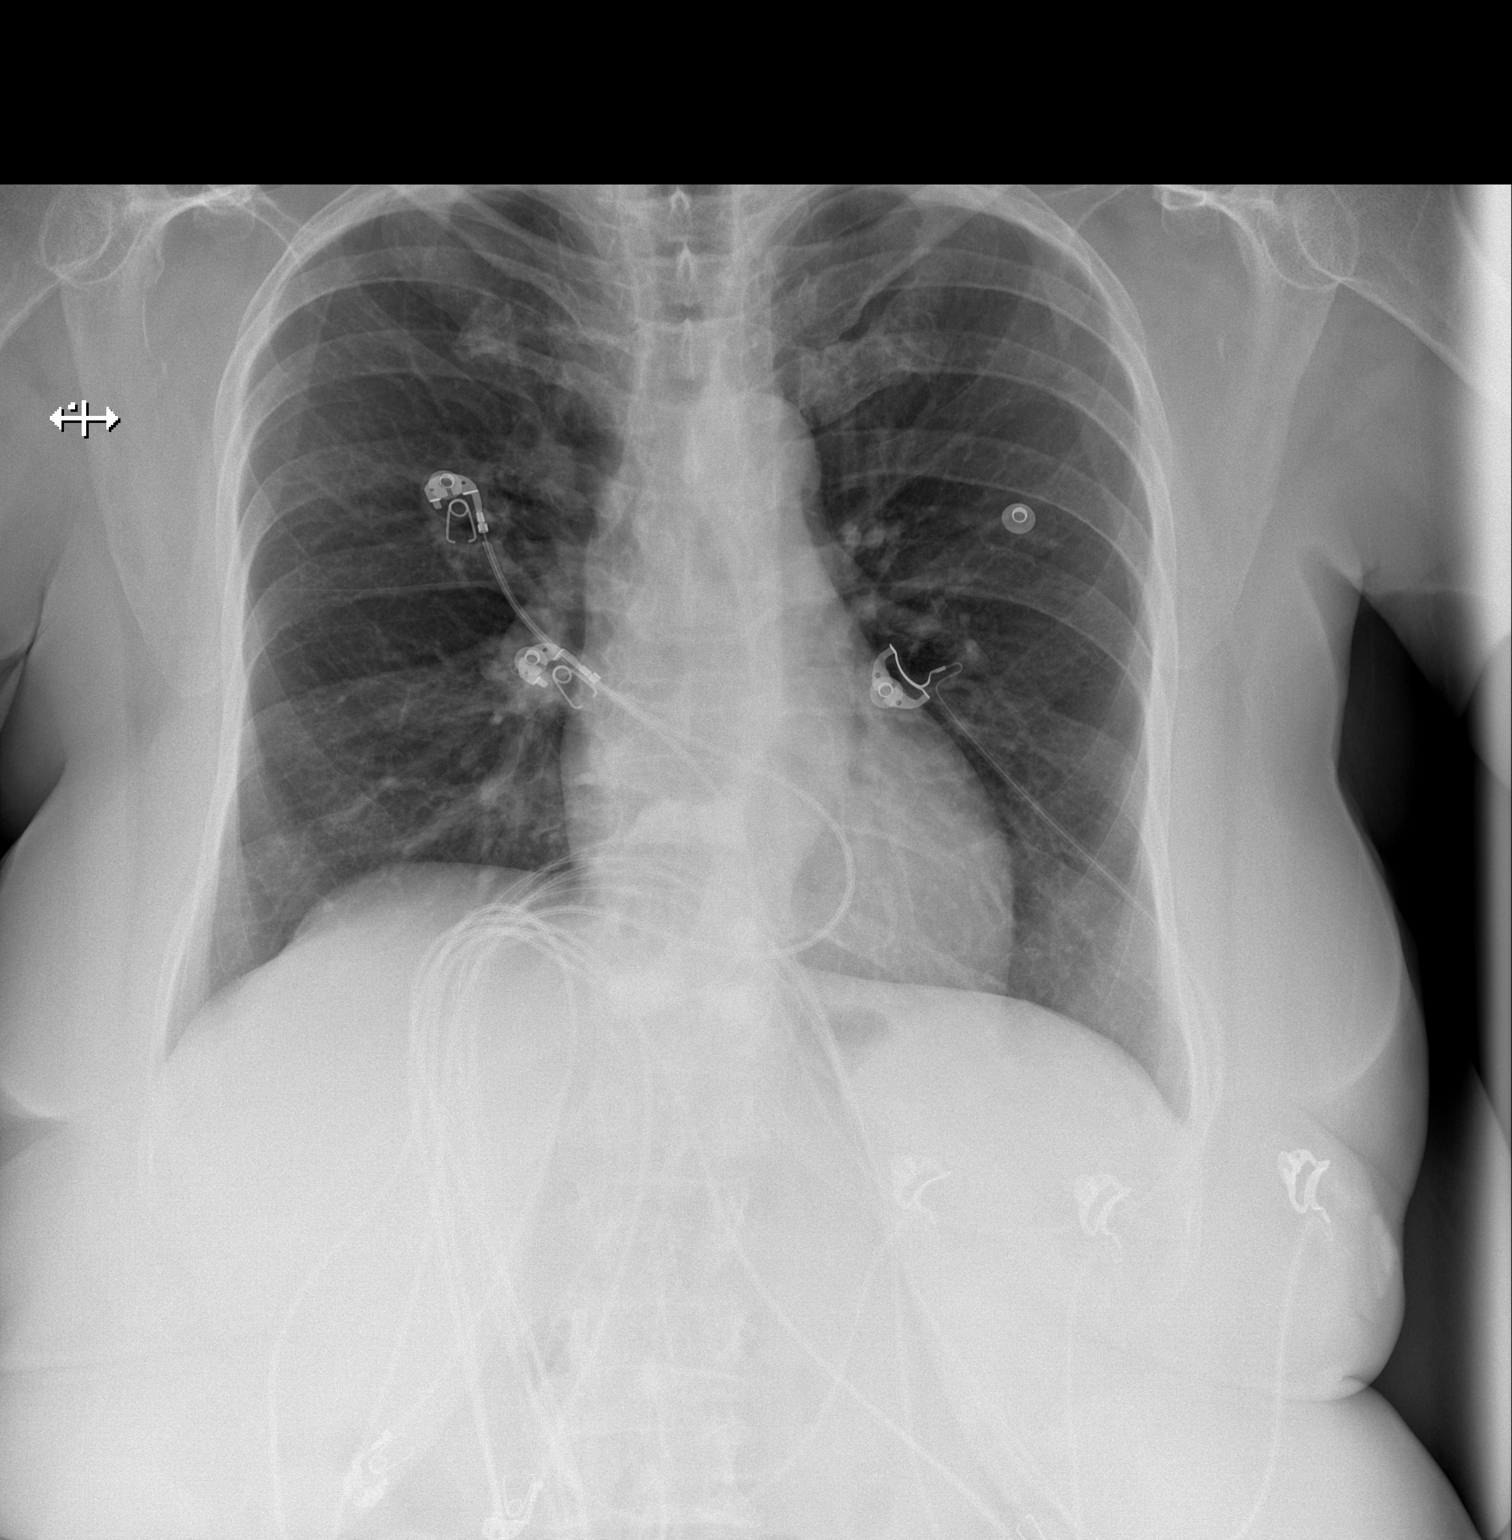

[w chest lat]
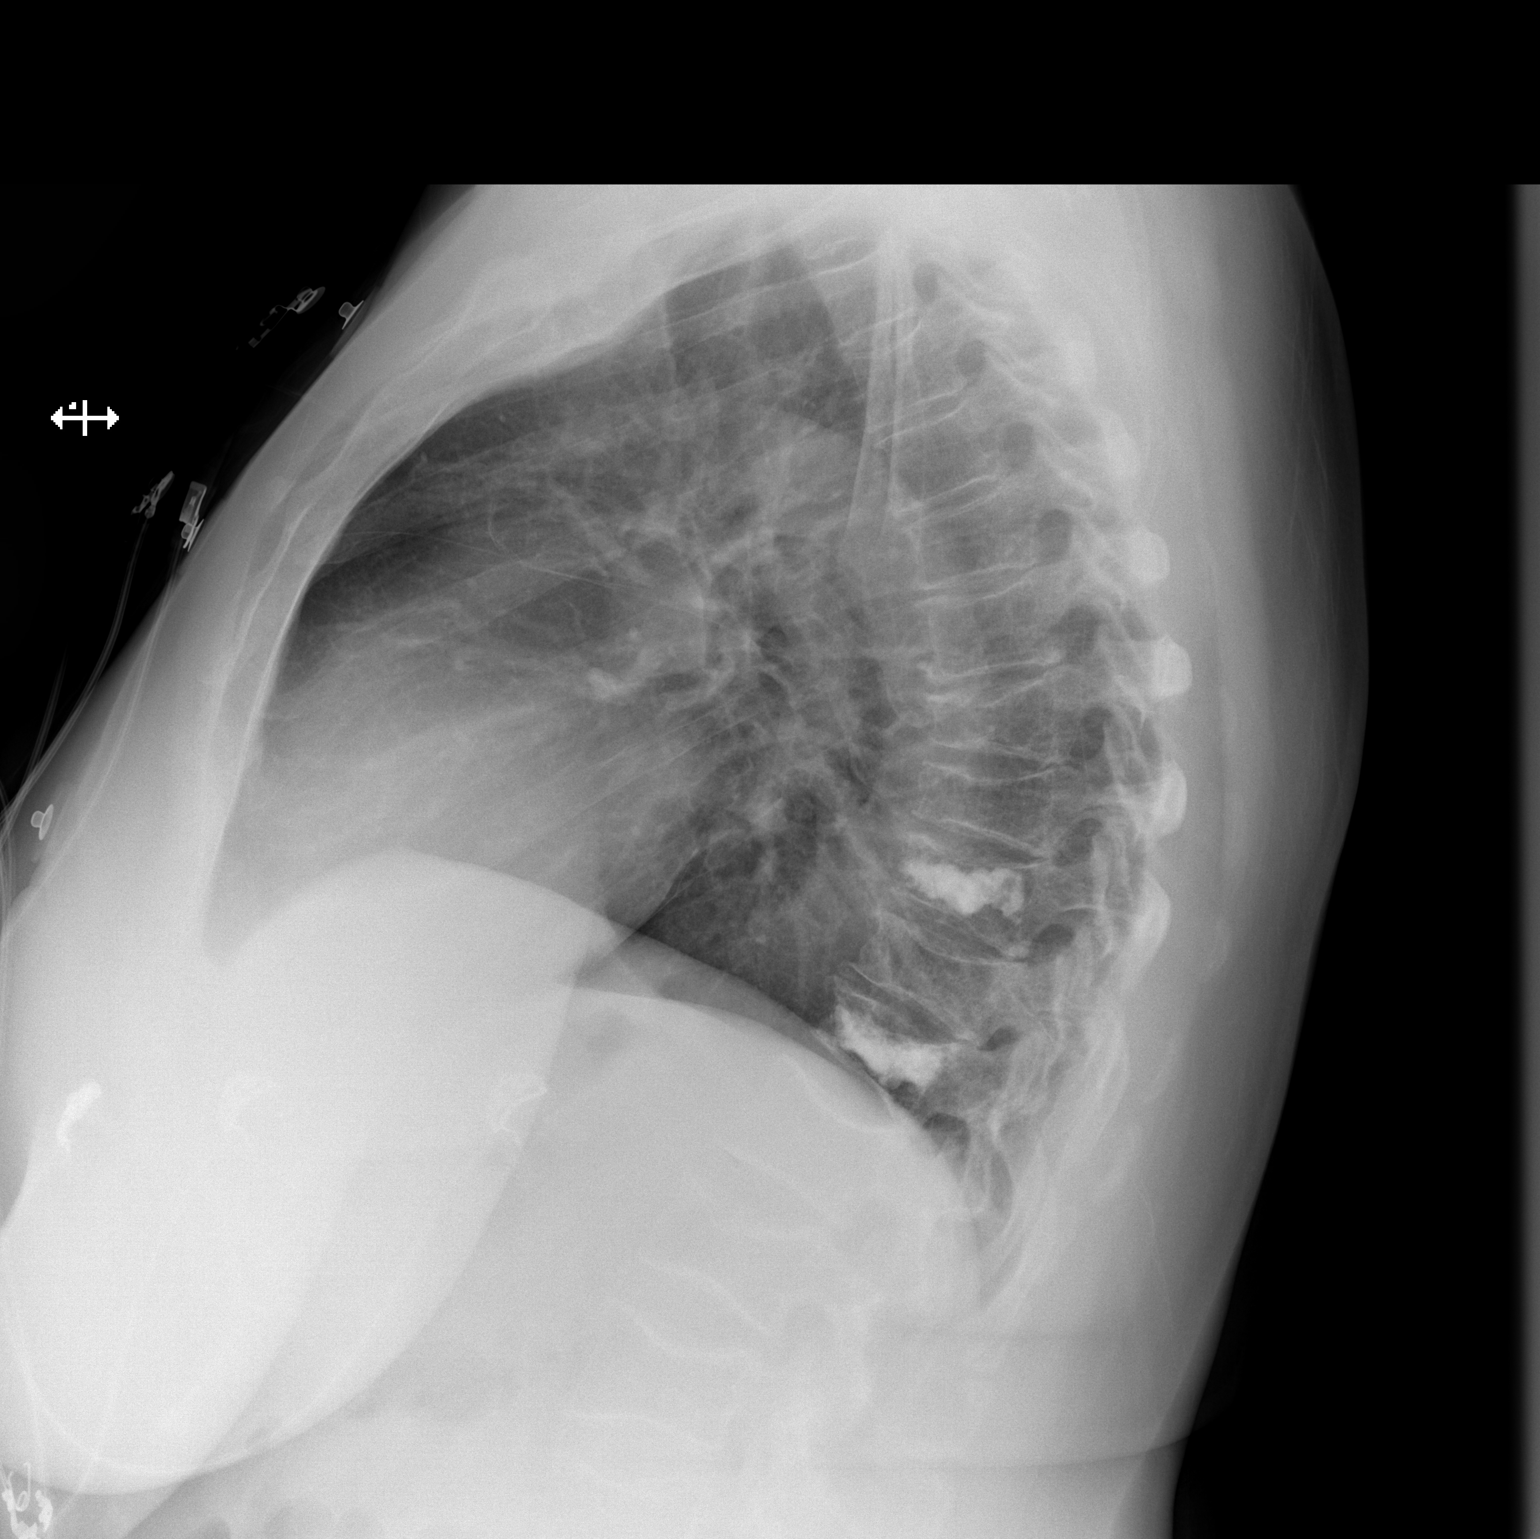

[2 of 2 positions shown; findings below may reference images not displayed]

FINDINGS: The heart size and mediastinal contours are within normal limits.
Both lungs are clear. Chronic stable moderate compressions of T8
through T12 with kyphoplasties at T10 and T12. Mild superior
endplate compression of L1, osteoporotic compression of L2 and L3
and superior endplate compression of the visualized L4 vertebral
body appear stable.
IMPRESSION: No active cardiopulmonary disease. Chronic stable thoracolumbar
compression fractures.

## 2018-10-09 IMAGING — XA IR US GUIDE VASC ACCESS RIGHT
1 series · 1 of 1 positions shown · non-contrast
Comparison: None.

INDICATION: History of multiple myeloma, now with severe thrombocytopenia,
worrisome for TTP. Please perform temporary dialysis catheter
placement for the initiation of plasmapheresis.

EXAM:
NON-TUNNELED CENTRAL VENOUS HEMODIALYSIS CATHETER PLACEMENT WITH
ULTRASOUND AND FLUOROSCOPIC GUIDANCE

[Series 1: single · 1 of 1 slices shown]
[im 1/1]
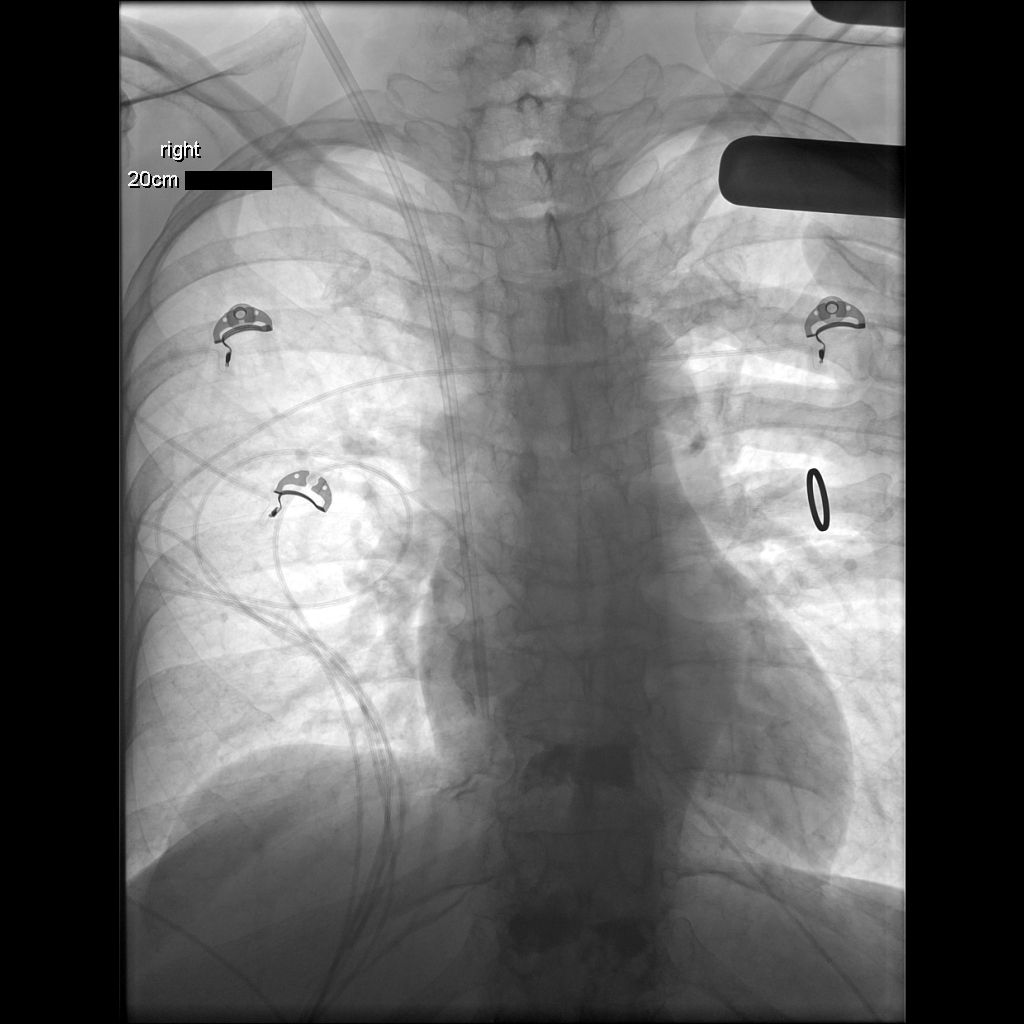

[1 of 1 positions shown; findings below may reference images not displayed]

MEDICATIONS:
None

FLUOROSCOPY TIME:  54 seconds (6 mGy)

COMPLICATIONS:
None immediate.



After creating a small venotomy incision, a micropuncture kit was
utilized to access the right internal jugular vein under direct,
real-time ultrasound guidance after the overlying soft tissues were
anesthetized with 1% lidocaine with epinephrine. Ultrasound image
documentation was performed. The microwire was kinked to measure
appropriate catheter length. A stiff glidewire was advanced to the
level of the IVC. Under fluoroscopic guidance, the venotomy was
serially dilated, ultimately allowing placement of a 20 cm temporary
[place] catheter with tip ultimately terminating within the
superior aspect of the right atrium. Final catheter positioning was
confirmed and documented with a spot radiographic image. The
catheter aspirates and flushes normally. The catheter was flushed
with appropriate volume heparin dwells.

The catheter exit site was secured with a 0-Prolene retention
suture. A dressing was placed. The patient tolerated the procedure
well without immediate post procedural complication.
IMPRESSION: Successful placement of a right internal jugular approach 20 cm
temporary dialysis catheter with tip terminating with in the
superior aspect of the right atrium. The catheter is ready for
immediate use.

PLAN:
This catheter may be converted to a tunneled dialysis catheter at a
later date as indicated.

## 2018-11-12 ENCOUNTER — Other Ambulatory Visit: Payer: Self-pay

## 2018-11-12 ENCOUNTER — Encounter: Payer: Self-pay | Admitting: Nurse Practitioner

## 2018-11-12 ENCOUNTER — Inpatient Hospital Stay (HOSPITAL_BASED_OUTPATIENT_CLINIC_OR_DEPARTMENT_OTHER): Payer: BC Managed Care – PPO | Admitting: Nurse Practitioner

## 2018-11-12 ENCOUNTER — Inpatient Hospital Stay: Payer: BC Managed Care – PPO | Attending: Oncology

## 2018-11-12 VITALS — BP 156/79 | HR 71 | Temp 97.8°F | Resp 17 | Ht 67.0 in | Wt 212.2 lb

## 2018-11-12 DIAGNOSIS — I10 Essential (primary) hypertension: Secondary | ICD-10-CM | POA: Diagnosis not present

## 2018-11-12 DIAGNOSIS — R000 Tachycardia, unspecified: Secondary | ICD-10-CM | POA: Insufficient documentation

## 2018-11-12 DIAGNOSIS — C9000 Multiple myeloma not having achieved remission: Secondary | ICD-10-CM | POA: Insufficient documentation

## 2018-11-12 DIAGNOSIS — Z23 Encounter for immunization: Secondary | ICD-10-CM | POA: Diagnosis not present

## 2018-11-12 DIAGNOSIS — C9001 Multiple myeloma in remission: Secondary | ICD-10-CM

## 2018-11-12 LAB — CBC WITH DIFFERENTIAL (CANCER CENTER ONLY)
Abs Immature Granulocytes: 0 10*3/uL (ref 0.00–0.07)
Basophils Absolute: 0 10*3/uL (ref 0.0–0.1)
Basophils Relative: 0 %
Eosinophils Absolute: 0.1 10*3/uL (ref 0.0–0.5)
Eosinophils Relative: 1 %
HCT: 37.7 % (ref 36.0–46.0)
Hemoglobin: 11.5 g/dL — ABNORMAL LOW (ref 12.0–15.0)
Immature Granulocytes: 0 %
Lymphocytes Relative: 60 %
Lymphs Abs: 2.9 10*3/uL (ref 0.7–4.0)
MCH: 27.6 pg (ref 26.0–34.0)
MCHC: 30.5 g/dL (ref 30.0–36.0)
MCV: 90.6 fL (ref 80.0–100.0)
Monocytes Absolute: 0.5 10*3/uL (ref 0.1–1.0)
Monocytes Relative: 10 %
Neutro Abs: 1.4 10*3/uL — ABNORMAL LOW (ref 1.7–7.7)
Neutrophils Relative %: 29 %
Platelet Count: 184 10*3/uL (ref 150–400)
RBC: 4.16 MIL/uL (ref 3.87–5.11)
RDW: 17.5 % — ABNORMAL HIGH (ref 11.5–15.5)
WBC Count: 4.8 10*3/uL (ref 4.0–10.5)
nRBC: 0 % (ref 0.0–0.2)

## 2018-11-12 LAB — CMP (CANCER CENTER ONLY)
ALT: 16 U/L (ref 0–44)
AST: 22 U/L (ref 15–41)
Albumin: 3.8 g/dL (ref 3.5–5.0)
Alkaline Phosphatase: 76 U/L (ref 38–126)
Anion gap: 9 (ref 5–15)
BUN: 18 mg/dL (ref 8–23)
CO2: 25 mmol/L (ref 22–32)
Calcium: 9.7 mg/dL (ref 8.9–10.3)
Chloride: 110 mmol/L (ref 98–111)
Creatinine: 1.15 mg/dL — ABNORMAL HIGH (ref 0.44–1.00)
GFR, Est AFR Am: 57 mL/min — ABNORMAL LOW (ref >60)
GFR, Estimated: 50 mL/min — ABNORMAL LOW (ref >60)
Glucose, Bld: 113 mg/dL — ABNORMAL HIGH (ref 70–99)
Potassium: 4.1 mmol/L (ref 3.5–5.1)
Sodium: 144 mmol/L (ref 135–145)
Total Bilirubin: 0.6 mg/dL (ref 0.3–1.2)
Total Protein: 7.6 g/dL (ref 6.5–8.1)

## 2018-11-12 MED ORDER — INFLUENZA VAC A&B SA ADJ QUAD 0.5 ML IM PRSY
0.5000 mL | PREFILLED_SYRINGE | Freq: Once | INTRAMUSCULAR | Status: AC
  Administered 2018-11-12: 0.5 mL via INTRAMUSCULAR
  Filled 2018-11-12: qty 0.5

## 2018-11-12 NOTE — Progress Notes (Signed)
[facility] OFFICE PROGRESS NOTE   Diagnosis: Multiple myeloma  INTERVAL HISTORY:   Carrie Huber returns as scheduled.  She feels well.  She is exercising.  She has a good appetite.  No interim illnesses or infections.  She denies pain.  Specifically no tooth or gum pain.  No nausea or vomiting.  Objective:  Vital signs in last 24 hours:  Blood pressure (!) 156/79, pulse 71, temperature 97.8 F (36.6 C), temperature source Oral, resp. rate 17, height _0  (1.702 m), weight 212 lb 3.2 oz (96.3 kg), SpO2 98 %.    HEENT: Neck without mass. Lymphatics: No palpable cervical, supraclavicular or axillary lymph nodes. GI: Abdomen soft and nontender.  No hepatomegaly.  No splenomegaly. Vascular: No leg edema.  Left lower leg is slightly larger than the right lower leg (chronic). Neuro: Alert and oriented.    Lab Results:  Lab Results  Component Value Date   WBC 4.8 11/12/2018   HGB 11.5 (L) 11/12/2018   HCT 37.7 11/12/2018   MCV 90.6 11/12/2018   PLT 184 11/12/2018   NEUTROABS 1.4 (L) 11/12/2018    Imaging:  No results found.  Medications: I have reviewed the patient's current medications.  Assessment/Plan: 1. Multiple myeloma, IgG kappa, status post 7 cycles of Revlimid/Decadron and Velcade with marked clinical and laboratory improvement. She is status post melphalan conditioning, followed by an autologous stem cell infusion 09/10/2008. She began maintenance Revlimid 01/04/2009. She has persistent mild elevation of the kappa and lambda light chains.  Revlimid placed on hold at time of TTP diagnosis  08/04/2016 SPEP with no M spike observed; stable mild elevation of lambda light chains  09/26/2016 mild elevation of lambda light chains, IgG in normal range  Bone survey 09/09/2017-stable findings of multiple myeloma. No new osseous abnormalities. Stable multiple thoracic and lumbar spine compression fractures. Stable moderate degenerative disc disease at  C5-6. 2. Neutropenia while on Revlimid at a dose of 10 mg daily. The Revlimid dose was reduced to 5 mg in September 2011 due to persistent neutropenia and a hospital admission with pneumonia/sepsis. . 3. Admission 11/03/2009 with left lung pneumonia and sepsis syndrome. No specific organism was cultured during the hospital admission. She completed outpatient Levaquin therapy.  4. 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  5. Thoracic spine compression fracture, status post a kyphoplasty 12/05/2007. A metastatic bone survey was unchanged on04/19/2018. 6. Tiny pulmonary embolism on a CT 11/20/2007, now maintained off of Coumadin.  7. [facility]  8. History of hypertension. 9. 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  Prednisone started 08/07/2016   Prednisone taper to 40 mg daily beginning 08/14/2016  Plasmapheresis 06/Huber/2018  Plasmapheresis 08/17/2016  Prednisone taper to 30 mg daily beginning 08/21/2016  Prednisone tapered to 20 mg daily beginning 09/03/2016  Prednisone tapered to 15 mg daily 09/11/2016  Prednisone tapered to 10 mg daily 09/26/2016  Prednisone taperedto 5 mg daily beginning 11/02/2016  Prednisone tapered to 2.5 mg daily for 7 days beginning 11/28/2016, then 2.5 mg every other day for 5 doses, then discontinued  10. Altered mental status-most likely secondary to TTP,resolved  11. Proteinuria on 24-hour urine 08/04/2016 (2.3 grams).Seen by Dr. Lorrene Huber during hospitalization June 2018.Renal ultrasound 08/09/2016 with  no acute abnormality seen. She is followed by Dr. Lorrene Huber.     Disposition: Ms. Usery appears stable.  She remains in clinical remission from multiple myeloma and TTP.  She is now receiving Zometa on a 18-monthschedule, next due in 3 months.  We are referring her for a bone survey in 3 months as well.  We reviewed the CBC from today.  Counts are stable.  She will return for lab, follow-up, Zometa in 3 months with a bone survey a few days prior.  She will contact the office in the interim with any problems.  Influenza vaccine administered at today's visit.  Plan reviewed with Dr. SBenay Spice  LNed CardANP/GNP-BC   11/12/2018  10:24 AM

## 2018-11-13 LAB — KAPPA/LAMBDA LIGHT CHAINS
Kappa free light chain: 36.7 mg/L — ABNORMAL HIGH (ref 3.3–19.4)
Kappa, lambda light chain ratio: 1.31 (ref 0.26–1.65)
Lambda free light chains: 28.1 mg/L — ABNORMAL HIGH (ref 5.7–26.3)

## 2018-11-13 LAB — PROTEIN ELECTROPHORESIS, SERUM
A/G Ratio: 1 (ref 0.7–1.7)
Albumin ELP: 3.5 g/dL (ref 2.9–4.4)
Alpha-1-Globulin: 0.2 g/dL (ref 0.0–0.4)
Alpha-2-Globulin: 0.9 g/dL (ref 0.4–1.0)
Beta Globulin: 1.1 g/dL (ref 0.7–1.3)
Gamma Globulin: 1.4 g/dL (ref 0.4–1.8)
Globulin, Total: 3.6 g/dL (ref 2.2–3.9)
Total Protein ELP: 7.1 g/dL (ref 6.0–8.5)

## 2019-02-09 ENCOUNTER — Ambulatory Visit (HOSPITAL_COMMUNITY)
Admission: RE | Admit: 2019-02-09 | Discharge: 2019-02-09 | Disposition: A | Discharge status: Home / Self Care | Payer: BC Managed Care – PPO | Source: Ambulatory Visit | Attending: Nurse Practitioner | Admitting: Nurse Practitioner

## 2019-02-09 ENCOUNTER — Other Ambulatory Visit: Payer: Self-pay

## 2019-02-09 DIAGNOSIS — C9001 Multiple myeloma in remission: Secondary | ICD-10-CM

## 2019-02-10 ENCOUNTER — Inpatient Hospital Stay: Payer: BC Managed Care – PPO | Attending: Oncology | Admitting: Oncology

## 2019-02-10 ENCOUNTER — Other Ambulatory Visit: Payer: Self-pay

## 2019-02-10 ENCOUNTER — Inpatient Hospital Stay: Payer: BC Managed Care – PPO

## 2019-02-10 VITALS — BP 163/87 | HR 67 | Temp 97.4°F | Resp 20 | Ht 67.0 in | Wt 201.3 lb

## 2019-02-10 DIAGNOSIS — C9001 Multiple myeloma in remission: Secondary | ICD-10-CM

## 2019-02-10 DIAGNOSIS — C9000 Multiple myeloma not having achieved remission: Secondary | ICD-10-CM | POA: Diagnosis present

## 2019-02-10 DIAGNOSIS — D649 Anemia, unspecified: Secondary | ICD-10-CM | POA: Diagnosis not present

## 2019-02-10 DIAGNOSIS — D709 Neutropenia, unspecified: Secondary | ICD-10-CM | POA: Insufficient documentation

## 2019-02-10 DIAGNOSIS — I10 Essential (primary) hypertension: Secondary | ICD-10-CM | POA: Diagnosis not present

## 2019-02-10 LAB — CBC WITH DIFFERENTIAL (CANCER CENTER ONLY)
Abs Immature Granulocytes: 0.01 10*3/uL (ref 0.00–0.07)
Basophils Absolute: 0 10*3/uL (ref 0.0–0.1)
Basophils Relative: 0 %
Eosinophils Absolute: 0.1 10*3/uL (ref 0.0–0.5)
Eosinophils Relative: 2 %
HCT: 36.2 % (ref 36.0–46.0)
Hemoglobin: 11.1 g/dL — ABNORMAL LOW (ref 12.0–15.0)
Immature Granulocytes: 0 %
Lymphocytes Relative: 56 %
Lymphs Abs: 2.7 10*3/uL (ref 0.7–4.0)
MCH: 27.9 pg (ref 26.0–34.0)
MCHC: 30.7 g/dL (ref 30.0–36.0)
MCV: 91 fL (ref 80.0–100.0)
Monocytes Absolute: 0.4 10*3/uL (ref 0.1–1.0)
Monocytes Relative: 8 %
Neutro Abs: 1.6 10*3/uL — ABNORMAL LOW (ref 1.7–7.7)
Neutrophils Relative %: 34 %
Platelet Count: 176 10*3/uL (ref 150–400)
RBC: 3.98 MIL/uL (ref 3.87–5.11)
RDW: 16.6 % — ABNORMAL HIGH (ref 11.5–15.5)
WBC Count: 4.8 10*3/uL (ref 4.0–10.5)
nRBC: 0 % (ref 0.0–0.2)

## 2019-02-10 LAB — CMP (CANCER CENTER ONLY)
ALT: 15 U/L (ref 0–44)
AST: 19 U/L (ref 15–41)
Albumin: 3.3 g/dL — ABNORMAL LOW (ref 3.5–5.0)
Alkaline Phosphatase: 82 U/L (ref 38–126)
Anion gap: 9 (ref 5–15)
BUN: 15 mg/dL (ref 8–23)
CO2: 26 mmol/L (ref 22–32)
Calcium: 9 mg/dL (ref 8.9–10.3)
Chloride: 108 mmol/L (ref 98–111)
Creatinine: 0.99 mg/dL (ref 0.44–1.00)
GFR, Est AFR Am: >60 mL/min (ref >60)
GFR, Estimated: 59 mL/min — ABNORMAL LOW (ref >60)
Glucose, Bld: 93 mg/dL (ref 70–99)
Potassium: 3.7 mmol/L (ref 3.5–5.1)
Sodium: 143 mmol/L (ref 135–145)
Total Bilirubin: 0.6 mg/dL (ref 0.3–1.2)
Total Protein: 7 g/dL (ref 6.5–8.1)

## 2019-02-10 MED ORDER — SODIUM CHLORIDE 0.9 % IV SOLN
INTRAVENOUS | Status: DC
  Administered 2019-02-10: 12:00:00 via INTRAVENOUS
  Filled 2019-02-10: qty 250

## 2019-02-10 MED ORDER — ZOLEDRONIC ACID 4 MG/100ML IV SOLN
4.0000 mg | Freq: Once | INTRAVENOUS | Status: AC
  Administered 2019-02-10: 4 mg via INTRAVENOUS
  Filled 2019-02-10: qty 100

## 2019-02-10 MED ORDER — ZOLEDRONIC ACID 4 MG/5ML IV CONC
4.0000 mg | Freq: Once | INTRAVENOUS | Status: DC
  Filled 2019-02-10: qty 5

## 2019-02-10 NOTE — Patient Instructions (Signed)
Per Dr. Benay Spice: OK to infuse Zometa every 6 months.  Zoledronic Acid injection (Hypercalcemia, Oncology) What is this medicine? ZOLEDRONIC ACID (ZOE le dron ik AS id) lowers the amount of calcium loss from bone. It is used to treat too much calcium in your blood from cancer. It is also used to prevent complications of cancer that has spread to the bone. This medicine may be used for other purposes; ask your health care provider or pharmacist if you have questions. COMMON BRAND NAME(S): Zometa What should I tell my health care provider before I take this medicine? They need to know if you have any of these conditions: -aspirin-sensitive asthma -cancer, especially if you are receiving medicines used to treat cancer -dental disease or wear dentures -infection -kidney disease -receiving corticosteroids like dexamethasone or prednisone -an unusual or allergic reaction to zoledronic acid, other medicines, foods, dyes, or preservatives -pregnant or trying to get pregnant -breast-feeding How should I use this medicine? This medicine is for infusion into a vein. It is given by a health care professional in a hospital or clinic setting. Talk to your pediatrician regarding the use of this medicine in children. Special care may be needed. Overdosage: If you think you have taken too much of this medicine contact a poison control center or emergency room at once. NOTE: This medicine is only for you. Do not share this medicine with others. What if I miss a dose? It is important not to miss your dose. Call your doctor or health care professional if you are unable to keep an appointment. What may interact with this medicine? -certain antibiotics given by injection -NSAIDs, medicines for pain and inflammation, like ibuprofen or naproxen -some diuretics like bumetanide, furosemide -teriparatide -thalidomide This list may not describe all possible interactions. Give your health care provider a list of  all the medicines, herbs, non-prescription drugs, or dietary supplements you use. Also tell them if you smoke, drink alcohol, or use illegal drugs. Some items may interact with your medicine. What should I watch for while using this medicine? Visit your doctor or health care professional for regular checkups. It may be some time before you see the benefit from this medicine. Do not stop taking your medicine unless your doctor tells you to. Your doctor may order blood tests or other tests to see how you are doing. Women should inform their doctor if they wish to become pregnant or think they might be pregnant. There is a potential for serious side effects to an unborn child. Talk to your health care professional or pharmacist for more information. You should make sure that you get enough calcium and vitamin D while you are taking this medicine. Discuss the foods you eat and the vitamins you take with your health care professional. Some people who take this medicine have severe bone, joint, and/or muscle pain. This medicine may also increase your risk for jaw problems or a broken thigh bone. Tell your doctor right away if you have severe pain in your jaw, bones, joints, or muscles. Tell your doctor if you have any pain that does not go away or that gets worse. Tell your dentist and dental surgeon that you are taking this medicine. You should not have major dental surgery while on this medicine. See your dentist to have a dental exam and fix any dental problems before starting this medicine. Take good care of your teeth while on this medicine. Make sure you see your dentist for regular follow-up appointments. What side  effects may I notice from receiving this medicine? Side effects that you should report to your doctor or health care professional as soon as possible: -allergic reactions like skin rash, itching or hives, swelling of the face, lips, or tongue -anxiety, confusion, or depression -breathing  problems -changes in vision -eye pain -feeling faint or lightheaded, falls -jaw pain, especially after dental work -mouth sores -muscle cramps, stiffness, or weakness -redness, blistering, peeling or loosening of the skin, including inside the mouth -trouble passing urine or change in the amount of urine Side effects that usually do not require medical attention (report to your doctor or health care professional if they continue or are bothersome): -bone, joint, or muscle pain -constipation -diarrhea -fever -hair loss -irritation at site where injected -loss of appetite -nausea, vomiting -stomach upset -trouble sleeping -trouble swallowing -weak or tired This list may not describe all possible side effects. Call your doctor for medical advice about side effects. You may report side effects to FDA at 1-800-FDA-1088. Where should I keep my medicine? This drug is given in a hospital or clinic and will not be stored at home. NOTE: This sheet is a summary. It may not cover all possible information. If you have questions about this medicine, talk to your doctor, pharmacist, or health care provider.  2019 Elsevier/Gold Standard (2013-07-18 14:19:39)

## 2019-02-10 NOTE — Progress Notes (Signed)
[facility] OFFICE PROGRESS NOTE   Diagnosis: Multiple myeloma  INTERVAL HISTORY:   Carrie Huber returns as scheduled.  She reports feeling well.  No pain.  She is walking 6 to 7 miles per day.  She relates weight loss to the exercise program.  Good appetite.  No recent infection.  Objective:  Vital signs in last 24 hours:  Blood pressure (!) 163/87, pulse 67, temperature (!) 97.4 F (36.3 C), temperature source Temporal, resp. rate 20, height '5\' 7"'  (1.702 m), weight 201 lb 4.8 oz (91.3 kg), SpO2 97 %.    Physical examination not performed today secondary to distancing with the Covid pandemic  Lab Results:  Lab Results  Component Value Date   WBC 4.8 02/10/2019   HGB 11.1 (L) 02/10/2019   HCT 36.2 02/10/2019   MCV 91.0 02/10/2019   PLT 176 02/10/2019   NEUTROABS 1.6 (L) 02/10/2019    CMP  Lab Results  Component Value Date   NA 143 02/10/2019   K 3.7 02/10/2019   CL 108 02/10/2019   CO2 26 02/10/2019   GLUCOSE 93 02/10/2019   BUN 15 02/10/2019   CREATININE 0.99 02/10/2019   CALCIUM 9.0 02/10/2019   PROT 7.0 02/10/2019   ALBUMIN 3.3 (L) 02/10/2019   AST 19 02/10/2019   ALT 15 02/10/2019   ALKPHOS 82 02/10/2019   BILITOT 0.6 02/10/2019   GFRNONAA 59 (L) 02/10/2019   GFRAA >60 02/10/2019      Imaging:  Dg Bone Survey Met  Result Date: 02/09/2019 CLINICAL DATA:  Multiple myeloma. EXAM: METASTATIC BONE SURVEY COMPARISON:  September 09, 2017. June 21, 2016. FINDINGS: Well rounded lucency is seen in the frontal skull on lateral projection which appears to be stable compared to prior exams. No lytic lesions are seen involving either shoulder. No definite lytic lesion is noted in the right humerus. Endo ostial scalloping is again noted in the proximal left humerus. This is stable compared to prior exam. No new lesion is noted. No abnormality seen involving either forearm. No lytic lesion seen in the cervical spine. Status post kyphoplasty of T10 and T12  vertebral bodies. Stable old compression fractures are seen involving T8, T9 and T11. No new fracture or lytic lesions are noted. Stable compression deformities are seen involving the L1, L2 and L3 vertebral bodies consistent with old fractures. No acute fracture or lytic lesions are noted. No lytic lesions are seen involving the visualized ribcage or scapula or clavicles. Stable sclerosis of pubic symphysis. Stable lucencies are seen involving the pelvis which potentially may represent myelomatous disease. Stable possible lucency seen in the proximal femoral shaft. No definite abnormality seen in the left femur. No definite abnormality seen involving the bilateral tibia and fibulas. IMPRESSION: Stable findings as described above. No definite evidence of new lytic lesions or bony abnormality is noted. Electronically Signed   By: Marijo Conception M.D.   On: 02/09/2019 16:06    Medications: I have reviewed the patient's current medications.   Assessment/Plan: 1. Multiple myeloma, IgG kappa, status post 7 cycles of Revlimid/Decadron and Velcade with marked clinical and laboratory improvement. She is status post melphalan conditioning, followed by an autologous stem cell infusion 09/10/2008. She began maintenance Revlimid 01/04/2009. She has persistent mild elevation of the kappa and lambda light chains.  Revlimid placed on hold at time of TTP diagnosis  08/04/2016 SPEP with no M spike observed; stable mild elevation of lambda light chains  09/26/2016 mild elevation of lambda light chains,  IgG in normal range  Bone survey 09/09/2017-stable findings of multiple myeloma. No new osseous abnormalities. Stable multiple thoracic and lumbar spine compression fractures. Stable moderate degenerative disc disease at C5-6.   Bone survey 02/09/2019-stable, no new lytic lesions 2. Neutropenia while on Revlimid at a dose of 10 mg daily. The Revlimid dose was reduced to 5 mg in September 2011 due to persistent  neutropenia and a hospital admission with pneumonia/sepsis. . 3. Admission 11/03/2009 with left lung pneumonia and sepsis syndrome. No specific organism was cultured during the hospital admission. She completed outpatient Levaquin therapy.  4. 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  5. Thoracic spine compression fracture, status post a kyphoplasty 12/05/2007. A metastatic bone survey was unchanged on04/19/2018. 6. Tiny pulmonary embolism on a CT 11/20/2007, now maintained off of Coumadin.  7. Chronic tachycardia on repeat office visits at the [facility].  8. History of hypertension. 9. 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  Prednisone started 08/07/2016   Prednisone taper to 40 mg daily beginning 08/14/2016  Plasmapheresis 08/15/2016  Plasmapheresis 08/17/2016  Prednisone taper to 30 mg daily beginning 08/21/2016  Prednisone tapered to 20 mg daily beginning 09/03/2016  Prednisone tapered to 15 mg daily 09/11/2016  Prednisone tapered to 10 mg daily 09/26/2016  Prednisone taperedto 5 mg daily beginning 11/02/2016  Prednisone tapered to 2.5 mg daily for 7 days beginning 11/28/2016, then 2.5 mg every other day for 5 doses, then discontinued  10. Altered mental status-most likely secondary to TTP,resolved  11. Proteinuria on 24-hour urine 08/04/2016 (2.3 grams).Seen by Dr. Lorrene Reid during hospitalization June 2018.Renal ultrasound 08/09/2016 with no acute abnormality seen. She is followed by Dr. Lorrene Reid   Disposition: Carrie Huber appears stable.  She will continue every 68-monthZometa.  She has chronic mild anemia and neutropenia, likely related to  multiple myeloma in the course of treatment.  She will receive Zometa today.  Ms. BShibleywill return for a lab visit in 3 months and an office visit in 6 months.  Carrie Coder MD  02/10/2019  10:30 AM

## 2019-02-11 ENCOUNTER — Telehealth: Payer: Self-pay | Admitting: Oncology

## 2019-02-11 LAB — PROTEIN ELECTROPHORESIS, SERUM
A/G Ratio: 1.1 (ref 0.7–1.7)
Albumin ELP: 3.4 g/dL (ref 2.9–4.4)
Alpha-1-Globulin: 0.2 g/dL (ref 0.0–0.4)
Alpha-2-Globulin: 0.8 g/dL (ref 0.4–1.0)
Beta Globulin: 1 g/dL (ref 0.7–1.3)
Gamma Globulin: 1.1 g/dL (ref 0.4–1.8)
Globulin, Total: 3.2 g/dL (ref 2.2–3.9)
Total Protein ELP: 6.6 g/dL (ref 6.0–8.5)

## 2019-02-11 LAB — KAPPA/LAMBDA LIGHT CHAINS
Kappa free light chain: 31.1 mg/L — ABNORMAL HIGH (ref 3.3–19.4)
Kappa, lambda light chain ratio: 1.16 (ref 0.26–1.65)
Lambda free light chains: 26.8 mg/L — ABNORMAL HIGH (ref 5.7–26.3)

## 2019-02-11 NOTE — Telephone Encounter (Signed)
Scheduled per los. Called and tried to leave msg. Called got disconnected. Mailed printout

## 2019-04-23 ENCOUNTER — Ambulatory Visit: Payer: BC Managed Care – PPO

## 2019-04-27 ENCOUNTER — Ambulatory Visit: Payer: BC Managed Care – PPO | Attending: Family

## 2019-04-27 DIAGNOSIS — Z23 Encounter for immunization: Secondary | ICD-10-CM | POA: Insufficient documentation

## 2019-04-27 NOTE — Progress Notes (Signed)
   Covid-19 Vaccination Clinic  Name:  Carrie Huber    MRN: [id number] DOB: 1951-02-07  04/27/2019  Carrie Huber was observed post Covid-19 immunization for 15 minutes without incidence. She was provided with Vaccine Information Sheet and instruction to access the V-Safe system.   Carrie Huber was instructed to call 911 with any severe reactions post vaccine: [name] Difficulty breathing  . Swelling of your face and throat  . A fast heartbeat  . A bad rash all over your body  . Dizziness and weakness    Immunizations Administered    Name Date Dose VIS Date Route   Moderna COVID-19 Vaccine 04/27/2019 10:25 AM 0.5 mL 02/03/2019 Intramuscular   Manufacturer: Moderna   Lot: [id number]   [place]:[id number]

## 2019-04-28 ENCOUNTER — Ambulatory Visit: Payer: BC Managed Care – PPO

## 2019-05-12 ENCOUNTER — Telehealth: Payer: Self-pay | Admitting: Nurse Practitioner

## 2019-05-12 ENCOUNTER — Inpatient Hospital Stay: Payer: BC Managed Care – PPO

## 2019-05-12 NOTE — Telephone Encounter (Signed)
I talk with patient regarding reschedule °

## 2019-05-15 ENCOUNTER — Inpatient Hospital Stay: Payer: BC Managed Care – PPO | Attending: Oncology

## 2019-05-15 ENCOUNTER — Other Ambulatory Visit: Payer: Self-pay

## 2019-05-15 DIAGNOSIS — C9000 Multiple myeloma not having achieved remission: Secondary | ICD-10-CM | POA: Insufficient documentation

## 2019-05-15 DIAGNOSIS — C9001 Multiple myeloma in remission: Secondary | ICD-10-CM

## 2019-05-15 LAB — CMP (CANCER CENTER ONLY)
ALT: 12 U/L (ref 0–44)
AST: 21 U/L (ref 15–41)
Albumin: 3.2 g/dL — ABNORMAL LOW (ref 3.5–5.0)
Alkaline Phosphatase: 78 U/L (ref 38–126)
Anion gap: 9 (ref 5–15)
BUN: 18 mg/dL (ref 8–23)
CO2: 24 mmol/L (ref 22–32)
Calcium: 9.6 mg/dL (ref 8.9–10.3)
Chloride: 109 mmol/L (ref 98–111)
Creatinine: 1.06 mg/dL — ABNORMAL HIGH (ref 0.44–1.00)
GFR, Est AFR Am: >60 mL/min (ref >60)
GFR, Estimated: 55 mL/min — ABNORMAL LOW (ref >60)
Glucose, Bld: 100 mg/dL — ABNORMAL HIGH (ref 70–99)
Potassium: 4.1 mmol/L (ref 3.5–5.1)
Sodium: 142 mmol/L (ref 135–145)
Total Bilirubin: 0.6 mg/dL (ref 0.3–1.2)
Total Protein: 6.9 g/dL (ref 6.5–8.1)

## 2019-05-15 LAB — CBC WITH DIFFERENTIAL (CANCER CENTER ONLY)
Abs Immature Granulocytes: 0.01 10*3/uL (ref 0.00–0.07)
Basophils Absolute: 0 10*3/uL (ref 0.0–0.1)
Basophils Relative: 1 %
Eosinophils Absolute: 0.1 10*3/uL (ref 0.0–0.5)
Eosinophils Relative: 2 %
HCT: 34.6 % — ABNORMAL LOW (ref 36.0–46.0)
Hemoglobin: 10.8 g/dL — ABNORMAL LOW (ref 12.0–15.0)
Immature Granulocytes: 0 %
Lymphocytes Relative: 62 %
Lymphs Abs: 3.3 10*3/uL (ref 0.7–4.0)
MCH: 28.3 pg (ref 26.0–34.0)
MCHC: 31.2 g/dL (ref 30.0–36.0)
MCV: 90.8 fL (ref 80.0–100.0)
Monocytes Absolute: 0.5 10*3/uL (ref 0.1–1.0)
Monocytes Relative: 10 %
Neutro Abs: 1.3 10*3/uL — ABNORMAL LOW (ref 1.7–7.7)
Neutrophils Relative %: 25 %
Platelet Count: 153 10*3/uL (ref 150–400)
RBC: 3.81 MIL/uL — ABNORMAL LOW (ref 3.87–5.11)
RDW: 17.2 % — ABNORMAL HIGH (ref 11.5–15.5)
WBC Count: 5.3 10*3/uL (ref 4.0–10.5)
nRBC: 0 % (ref 0.0–0.2)

## 2019-05-16 LAB — IGG, IGA, IGM
IgA: 428 mg/dL — ABNORMAL HIGH (ref 87–352)
IgG (Immunoglobin G), Serum: 1346 mg/dL (ref 586–1602)
IgM (Immunoglobulin M), Srm: 56 mg/dL (ref 26–217)

## 2019-05-18 LAB — KAPPA/LAMBDA LIGHT CHAINS
Kappa free light chain: 35.6 mg/L — ABNORMAL HIGH (ref 3.3–19.4)
Kappa, lambda light chain ratio: 1.12 (ref 0.26–1.65)
Lambda free light chains: 31.7 mg/L — ABNORMAL HIGH (ref 5.7–26.3)

## 2019-05-19 LAB — PROTEIN ELECTROPHORESIS, SERUM
A/G Ratio: 0.9 (ref 0.7–1.7)
Albumin ELP: 3.2 g/dL (ref 2.9–4.4)
Alpha-1-Globulin: 0.2 g/dL (ref 0.0–0.4)
Alpha-2-Globulin: 0.8 g/dL (ref 0.4–1.0)
Beta Globulin: 1.1 g/dL (ref 0.7–1.3)
Gamma Globulin: 1.3 g/dL (ref 0.4–1.8)
Globulin, Total: 3.4 g/dL (ref 2.2–3.9)
Total Protein ELP: 6.6 g/dL (ref 6.0–8.5)

## 2019-05-26 ENCOUNTER — Ambulatory Visit: Payer: BC Managed Care – PPO | Attending: Family

## 2019-05-26 DIAGNOSIS — Z23 Encounter for immunization: Secondary | ICD-10-CM

## 2019-05-26 NOTE — Progress Notes (Signed)
   Covid-19 Vaccination Clinic  Name:  Carrie Huber    MRN: [id number] DOB: [birth year]  05/26/2019  [name] was observed post Covid-19 immunization for 15 minutes without incident. She was provided with Vaccine Information Sheet and instruction to access the V-Safe system.   [name] was instructed to call 911 with any severe reactions post vaccine: [name] Difficulty breathing  . Swelling of face and throat  . A fast heartbeat  . A bad rash all over body  . Dizziness and weakness   Immunizations Administered    Name Date Dose VIS Date Route   Moderna COVID-19 Vaccine 05/26/2019 10:09 AM 0.5 mL 02/03/2019 Intramuscular   Manufacturer: Moderna   Lot: [id number]   [id number]

## 2019-08-11 ENCOUNTER — Inpatient Hospital Stay: Payer: BC Managed Care – PPO | Attending: Oncology | Admitting: Nurse Practitioner

## 2019-08-11 ENCOUNTER — Other Ambulatory Visit: Payer: Self-pay

## 2019-08-11 ENCOUNTER — Encounter: Payer: Self-pay | Admitting: Nurse Practitioner

## 2019-08-11 ENCOUNTER — Inpatient Hospital Stay: Payer: BC Managed Care – PPO

## 2019-08-11 VITALS — BP 152/84 | HR 71 | Temp 97.7°F | Resp 18 | Ht 67.0 in | Wt 192.7 lb

## 2019-08-11 DIAGNOSIS — C9001 Multiple myeloma in remission: Secondary | ICD-10-CM | POA: Diagnosis not present

## 2019-08-11 DIAGNOSIS — C9000 Multiple myeloma not having achieved remission: Secondary | ICD-10-CM | POA: Diagnosis not present

## 2019-08-11 LAB — CBC WITH DIFFERENTIAL (CANCER CENTER ONLY)
Abs Immature Granulocytes: 0.01 10*3/uL (ref 0.00–0.07)
Basophils Absolute: 0 10*3/uL (ref 0.0–0.1)
Basophils Relative: 0 %
Eosinophils Absolute: 0.1 10*3/uL (ref 0.0–0.5)
Eosinophils Relative: 2 %
HCT: 36.7 % (ref 36.0–46.0)
Hemoglobin: 11.4 g/dL — ABNORMAL LOW (ref 12.0–15.0)
Immature Granulocytes: 0 %
Lymphocytes Relative: 61 %
Lymphs Abs: 2.7 10*3/uL (ref 0.7–4.0)
MCH: 27.7 pg (ref 26.0–34.0)
MCHC: 31.1 g/dL (ref 30.0–36.0)
MCV: 89.3 fL (ref 80.0–100.0)
Monocytes Absolute: 0.4 10*3/uL (ref 0.1–1.0)
Monocytes Relative: 8 %
Neutro Abs: 1.3 10*3/uL — ABNORMAL LOW (ref 1.7–7.7)
Neutrophils Relative %: 29 %
Platelet Count: 170 10*3/uL (ref 150–400)
RBC: 4.11 MIL/uL (ref 3.87–5.11)
RDW: 16.4 % — ABNORMAL HIGH (ref 11.5–15.5)
WBC Count: 4.4 10*3/uL (ref 4.0–10.5)
nRBC: 0 % (ref 0.0–0.2)

## 2019-08-11 LAB — CMP (CANCER CENTER ONLY)
ALT: 15 U/L (ref 0–44)
AST: 20 U/L (ref 15–41)
Albumin: 3.5 g/dL (ref 3.5–5.0)
Alkaline Phosphatase: 75 U/L (ref 38–126)
Anion gap: 10 (ref 5–15)
BUN: 14 mg/dL (ref 8–23)
CO2: 24 mmol/L (ref 22–32)
Calcium: 9.7 mg/dL (ref 8.9–10.3)
Chloride: 109 mmol/L (ref 98–111)
Creatinine: 1.11 mg/dL — ABNORMAL HIGH (ref 0.44–1.00)
GFR, Est AFR Am: 60 mL/min — ABNORMAL LOW (ref >60)
GFR, Estimated: 51 mL/min — ABNORMAL LOW (ref >60)
Glucose, Bld: 87 mg/dL (ref 70–99)
Potassium: 4 mmol/L (ref 3.5–5.1)
Sodium: 143 mmol/L (ref 135–145)
Total Bilirubin: 0.8 mg/dL (ref 0.3–1.2)
Total Protein: 7.3 g/dL (ref 6.5–8.1)

## 2019-08-11 MED ORDER — ZOLEDRONIC ACID 4 MG/100ML IV SOLN
INTRAVENOUS | Status: AC
  Filled 2019-08-11: qty 100

## 2019-08-11 MED ORDER — ZOLEDRONIC ACID 4 MG/100ML IV SOLN
4.0000 mg | Freq: Once | INTRAVENOUS | Status: AC
  Administered 2019-08-11: 4 mg via INTRAVENOUS

## 2019-08-11 MED ORDER — ZOLEDRONIC ACID 4 MG/5ML IV CONC: 4.0000 mg | Freq: Once | INTRAVENOUS | Status: DC

## 2019-08-11 NOTE — Progress Notes (Signed)
Carrie Huber   Diagnosis: Multiple myeloma  INTERVAL HISTORY:   Carrie Huber returns as scheduled.  She feels well.  No interim illnesses or infections.  She continues a walking program.  She is actively trying to lose weight.  She has a good appetite.  She denies pain.  Objective:  Vital signs in last 24 hours:  Blood pressure (!) 166/87, pulse 71, temperature 97.7 F (36.5 C), temperature source Temporal, resp. rate 18, height '5\' 7"'  (1.702 m), weight 192 lb 11.2 oz (87.4 kg), SpO2 100 %.    HEENT: No thrush or ulcers. Resp: Lungs clear bilaterally. Cardio: Regular rate and rhythm. GI: Abdomen soft and nontender.  No hepatosplenomegaly. Vascular: No leg edema.  Left lower leg is larger than the right lower leg.  Calves soft and nontender.   Lab Results:  Lab Results  Component Value Date   WBC 4.4 08/11/2019   HGB 11.4 (L) 08/11/2019   HCT 36.7 08/11/2019   MCV 89.3 08/11/2019   PLT 170 08/11/2019   NEUTROABS 1.3 (L) 08/11/2019    Imaging:  No results found.  Medications: I have reviewed the patient's current medications.  Assessment/Plan: 1. Multiple myeloma, IgG kappa, status post 7 cycles of Revlimid/Decadron and Velcade with marked clinical and laboratory improvement. She is status post melphalan conditioning, followed by an autologous stem cell infusion 09/10/2008. She began maintenance Revlimid 01/04/2009. She has persistent mild elevation of the kappa and lambda light chains.  Revlimid placed on hold at time of TTP diagnosis  08/04/2016 SPEP with no M spike observed; stable mild elevation of lambda light chains  09/26/2016 mild elevation of lambda light chains, IgG in normal range  Bone survey 09/09/2017-stable findings of multiple myeloma. No new osseous abnormalities. Stable multiple thoracic and lumbar spine compression fractures. Stable moderate degenerative disc disease at C5-6.   Bone survey 02/09/2019-stable, no  new lytic lesions 2. Neutropenia while on Revlimid at a dose of 10 mg daily. The Revlimid dose was reduced to 5 mg in September 2011 due to persistent neutropenia and a hospital admission with pneumonia/sepsis. . 3. Admission 11/03/2009 with left lung pneumonia and sepsis syndrome. No specific organism was cultured during the hospital admission. She completed outpatient Levaquin therapy.  4. 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  5. Thoracic spine compression fracture, status post a kyphoplasty 12/05/2007. A metastatic bone survey was unchanged on04/19/2018. 6. Tiny pulmonary embolism on a CT 11/20/2007, now maintained off of Coumadin.  7. Chronic tachycardia on repeat office visits at the [facility].  8. History of hypertension. 9. 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  Prednisone started 08/07/2016   Prednisone taper to 40 mg daily beginning 08/14/2016  Plasmapheresis 08/15/2016  Plasmapheresis 08/17/2016  Prednisone taper to 30 mg daily beginning 08/21/2016  Prednisone tapered to 20 mg daily beginning 09/03/2016  Prednisone tapered to 15 mg daily 09/11/2016  Prednisone tapered to 10 mg daily 09/26/2016  Prednisone taperedto 5 mg daily beginning 11/02/2016  Prednisone tapered to 2.5 mg daily for 7 days beginning 11/28/2016, then 2.5 mg every other day for 5 doses, then discontinued  10. Altered mental status-most likely secondary to TTP,resolved  11. Proteinuria on 24-hour urine 08/04/2016 (2.3 grams).Seen by Dr. Lorrene Reid during hospitalization June 2018.Renal  ultrasound 08/09/2016 with no acute abnormality seen. She is followed by Dr.  Lorrene Reid   Disposition: Carrie Huber appears well.  She will continue every 43-monthZometa, infusion today.  CBC from today shows stable mild anemia and neutropenia.  We will follow-up on the myeloma panel.  She will return for labs in 3 months; lab, office visit and Zometa in 6 months.    LNed CardANP/GNP-BC   08/11/2019  2:00 PM

## 2019-08-11 NOTE — Patient Instructions (Signed)
Zoledronic Acid injection (Hypercalcemia, Oncology) What is this medicine? ZOLEDRONIC ACID (ZOE le dron ik AS id) lowers the amount of calcium loss from bone. It is used to treat too much calcium in your blood from cancer. It is also used to prevent complications of cancer that has spread to the bone. This medicine may be used for other purposes; ask your health care provider or pharmacist if you have questions. COMMON BRAND NAME(S): Zometa What should I tell my health care provider before I take this medicine? They need to know if you have any of these conditions:  aspirin-sensitive asthma  cancer, especially if you are receiving medicines used to treat cancer  dental disease or wear dentures  infection  kidney disease  receiving corticosteroids like dexamethasone or prednisone  an unusual or allergic reaction to zoledronic acid, other medicines, foods, dyes, or preservatives  pregnant or trying to get pregnant  breast-feeding How should I use this medicine? This medicine is for infusion into a vein. It is given by a health care professional in a hospital or clinic setting. Talk to your pediatrician regarding the use of this medicine in children. Special care may be needed. Overdosage: If you think you have taken too much of this medicine contact a poison control center or emergency room at once. NOTE: This medicine is only for you. Do not share this medicine with others. What if I miss a dose? It is important not to miss your dose. Call your doctor or health care professional if you are unable to keep an appointment. What may interact with this medicine?  certain antibiotics given by injection  NSAIDs, medicines for pain and inflammation, like ibuprofen or naproxen  some diuretics like bumetanide, furosemide  teriparatide  thalidomide This list may not describe all possible interactions. Give your health care provider a list of all the medicines, herbs, non-prescription  drugs, or dietary supplements you use. Also tell them if you smoke, drink alcohol, or use illegal drugs. Some items may interact with your medicine. What should I watch for while using this medicine? Visit your doctor or health care professional for regular checkups. It may be some time before you see the benefit from this medicine. Do not stop taking your medicine unless your doctor tells you to. Your doctor may order blood tests or other tests to see how you are doing. Women should inform their doctor if they wish to become pregnant or think they might be pregnant. There is a potential for serious side effects to an unborn child. Talk to your health care professional or pharmacist for more information. You should make sure that you get enough calcium and vitamin D while you are taking this medicine. Discuss the foods you eat and the vitamins you take with your health care professional. Some people who take this medicine have severe bone, joint, and/or muscle pain. This medicine may also increase your risk for jaw problems or a broken thigh bone. Tell your doctor right away if you have severe pain in your jaw, bones, joints, or muscles. Tell your doctor if you have any pain that does not go away or that gets worse. Tell your dentist and dental surgeon that you are taking this medicine. You should not have major dental surgery while on this medicine. See your dentist to have a dental exam and fix any dental problems before starting this medicine. Take good care of your teeth while on this medicine. Make sure you see your dentist for regular follow-up   appointments. What side effects may I notice from receiving this medicine? Side effects that you should report to your doctor or health care professional as soon as possible:  allergic reactions like skin rash, itching or hives, swelling of the face, lips, or tongue  anxiety, confusion, or depression  breathing problems  changes in vision  eye  pain  feeling faint or lightheaded, falls  jaw pain, especially after dental work  mouth sores  muscle cramps, stiffness, or weakness  redness, blistering, peeling or loosening of the skin, including inside the mouth  trouble passing urine or change in the amount of urine Side effects that usually do not require medical attention (report to your doctor or health care professional if they continue or are bothersome):  bone, joint, or muscle pain  constipation  diarrhea  fever  hair loss  irritation at site where injected  loss of appetite  nausea, vomiting  stomach upset  trouble sleeping  trouble swallowing  weak or tired This list may not describe all possible side effects. Call your doctor for medical advice about side effects. You may report side effects to FDA at 1-800-FDA-1088. Where should I keep my medicine? This drug is given in a hospital or clinic and will not be stored at home. NOTE: This sheet is a summary. It may not cover all possible information. If you have questions about this medicine, talk to your doctor, pharmacist, or health care provider.  2020 Elsevier/Gold Standard (2013-07-18 14:19:39)  

## 2019-08-12 ENCOUNTER — Telehealth: Payer: Self-pay | Admitting: Oncology

## 2019-08-12 LAB — KAPPA/LAMBDA LIGHT CHAINS
Kappa free light chain: 33.5 mg/L — ABNORMAL HIGH (ref 3.3–19.4)
Kappa, lambda light chain ratio: 1.27 (ref 0.26–1.65)
Lambda free light chains: 26.3 mg/L (ref 5.7–26.3)

## 2019-08-12 LAB — PROTEIN ELECTROPHORESIS, SERUM
A/G Ratio: 0.8 (ref 0.7–1.7)
Albumin ELP: 3.2 g/dL (ref 2.9–4.4)
Alpha-1-Globulin: 0.2 g/dL (ref 0.0–0.4)
Alpha-2-Globulin: 0.9 g/dL (ref 0.4–1.0)
Beta Globulin: 1.2 g/dL (ref 0.7–1.3)
Gamma Globulin: 1.5 g/dL (ref 0.4–1.8)
Globulin, Total: 3.9 g/dL (ref 2.2–3.9)
Total Protein ELP: 7.1 g/dL (ref 6.0–8.5)

## 2019-08-12 NOTE — Telephone Encounter (Signed)
Scheduled per 6/8 los. Pt aware of appts. Mychart active.

## 2019-08-13 LAB — IGG, IGA, IGM
IgA: 443 mg/dL — ABNORMAL HIGH (ref 87–352)
IgG (Immunoglobin G), Serum: 1381 mg/dL (ref 586–1602)
IgM (Immunoglobulin M), Srm: 61 mg/dL (ref 26–217)

## 2019-11-11 ENCOUNTER — Other Ambulatory Visit: Payer: Self-pay

## 2019-11-11 ENCOUNTER — Inpatient Hospital Stay: Payer: BC Managed Care – PPO | Attending: Oncology

## 2019-11-11 DIAGNOSIS — C9000 Multiple myeloma not having achieved remission: Secondary | ICD-10-CM | POA: Insufficient documentation

## 2019-11-11 DIAGNOSIS — C9001 Multiple myeloma in remission: Secondary | ICD-10-CM

## 2019-11-11 LAB — CMP (CANCER CENTER ONLY)
ALT: 17 U/L (ref 0–44)
AST: 22 U/L (ref 15–41)
Albumin: 3.5 g/dL (ref 3.5–5.0)
Alkaline Phosphatase: 69 U/L (ref 38–126)
Anion gap: 9 (ref 5–15)
BUN: 18 mg/dL (ref 8–23)
CO2: 23 mmol/L (ref 22–32)
Calcium: 9.7 mg/dL (ref 8.9–10.3)
Chloride: 109 mmol/L (ref 98–111)
Creatinine: 1.11 mg/dL — ABNORMAL HIGH (ref 0.44–1.00)
GFR, Est AFR Am: 60 mL/min — ABNORMAL LOW (ref >60)
GFR, Estimated: 51 mL/min — ABNORMAL LOW (ref >60)
Glucose, Bld: 95 mg/dL (ref 70–99)
Potassium: 3.8 mmol/L (ref 3.5–5.1)
Sodium: 141 mmol/L (ref 135–145)
Total Bilirubin: 0.9 mg/dL (ref 0.3–1.2)
Total Protein: 7.6 g/dL (ref 6.5–8.1)

## 2019-11-11 LAB — CBC WITH DIFFERENTIAL (CANCER CENTER ONLY)
Abs Immature Granulocytes: 0 10*3/uL (ref 0.00–0.07)
Basophils Absolute: 0 10*3/uL (ref 0.0–0.1)
Basophils Relative: 0 %
Eosinophils Absolute: 0 10*3/uL (ref 0.0–0.5)
Eosinophils Relative: 1 %
HCT: 36.7 % (ref 36.0–46.0)
Hemoglobin: 11.6 g/dL — ABNORMAL LOW (ref 12.0–15.0)
Immature Granulocytes: 0 %
Lymphocytes Relative: 47 %
Lymphs Abs: 2.4 10*3/uL (ref 0.7–4.0)
MCH: 28.3 pg (ref 26.0–34.0)
MCHC: 31.6 g/dL (ref 30.0–36.0)
MCV: 89.5 fL (ref 80.0–100.0)
Monocytes Absolute: 0.4 10*3/uL (ref 0.1–1.0)
Monocytes Relative: 9 %
Neutro Abs: 2.2 10*3/uL (ref 1.7–7.7)
Neutrophils Relative %: 43 %
Platelet Count: 166 10*3/uL (ref 150–400)
RBC: 4.1 MIL/uL (ref 3.87–5.11)
RDW: 17.2 % — ABNORMAL HIGH (ref 11.5–15.5)
WBC Count: 5 10*3/uL (ref 4.0–10.5)
nRBC: 0 % (ref 0.0–0.2)

## 2019-11-12 LAB — PROTEIN ELECTROPHORESIS, SERUM
A/G Ratio: 1 (ref 0.7–1.7)
Albumin ELP: 3.4 g/dL (ref 2.9–4.4)
Alpha-1-Globulin: 0.2 g/dL (ref 0.0–0.4)
Alpha-2-Globulin: 0.8 g/dL (ref 0.4–1.0)
Beta Globulin: 1.1 g/dL (ref 0.7–1.3)
Gamma Globulin: 1.4 g/dL (ref 0.4–1.8)
Globulin, Total: 3.4 g/dL (ref 2.2–3.9)
Total Protein ELP: 6.8 g/dL (ref 6.0–8.5)

## 2019-11-12 LAB — KAPPA/LAMBDA LIGHT CHAINS
Kappa free light chain: 28.9 mg/L — ABNORMAL HIGH (ref 3.3–19.4)
Kappa, lambda light chain ratio: 1.33 (ref 0.26–1.65)
Lambda free light chains: 21.8 mg/L (ref 5.7–26.3)

## 2019-11-12 LAB — IGG, IGA, IGM
IgA: 469 mg/dL — ABNORMAL HIGH (ref 87–352)
IgG (Immunoglobin G), Serum: 1348 mg/dL (ref 586–1602)
IgM (Immunoglobulin M), Srm: 73 mg/dL (ref 26–217)

## 2020-01-05 ENCOUNTER — Ambulatory Visit: Payer: BC Managed Care – PPO | Attending: Internal Medicine

## 2020-01-05 DIAGNOSIS — Z23 Encounter for immunization: Secondary | ICD-10-CM

## 2020-01-05 NOTE — Progress Notes (Signed)
   Covid-19 Vaccination Clinic  Name:  SHANYLA MARCONI    MRN: [id number] DOB: 07-18-1950  01/05/2020  Ms. Conradt was observed post Covid-19 immunization for 15 minutes without incident. She was provided with Vaccine Information Sheet and instruction to access the V-Safe system.   Ms. Macari was instructed to call 911 with any severe reactions post vaccine: [name] Difficulty breathing  . Swelling of face and throat  . A fast heartbeat  . A bad rash all over body  . Dizziness and weakness

## 2020-02-10 ENCOUNTER — Other Ambulatory Visit: Payer: Self-pay

## 2020-02-10 ENCOUNTER — Inpatient Hospital Stay: Payer: BC Managed Care – PPO

## 2020-02-10 ENCOUNTER — Inpatient Hospital Stay: Payer: BC Managed Care – PPO | Attending: Oncology

## 2020-02-10 ENCOUNTER — Inpatient Hospital Stay (HOSPITAL_BASED_OUTPATIENT_CLINIC_OR_DEPARTMENT_OTHER): Payer: BC Managed Care – PPO | Admitting: Oncology

## 2020-02-10 VITALS — BP 138/86 | HR 68 | Temp 97.8°F | Resp 20 | Ht 67.0 in | Wt 192.6 lb

## 2020-02-10 DIAGNOSIS — C9001 Multiple myeloma in remission: Secondary | ICD-10-CM

## 2020-02-10 DIAGNOSIS — C9000 Multiple myeloma not having achieved remission: Secondary | ICD-10-CM | POA: Insufficient documentation

## 2020-02-10 DIAGNOSIS — I10 Essential (primary) hypertension: Secondary | ICD-10-CM | POA: Insufficient documentation

## 2020-02-10 LAB — CBC WITH DIFFERENTIAL (CANCER CENTER ONLY)
Abs Immature Granulocytes: 0.01 10*3/uL (ref 0.00–0.07)
Basophils Absolute: 0 10*3/uL (ref 0.0–0.1)
Basophils Relative: 0 %
Eosinophils Absolute: 0.2 10*3/uL (ref 0.0–0.5)
Eosinophils Relative: 3 %
HCT: 37.2 % (ref 36.0–46.0)
Hemoglobin: 11.9 g/dL — ABNORMAL LOW (ref 12.0–15.0)
Immature Granulocytes: 0 %
Lymphocytes Relative: 50 %
Lymphs Abs: 2.3 10*3/uL (ref 0.7–4.0)
MCH: 29.2 pg (ref 26.0–34.0)
MCHC: 32 g/dL (ref 30.0–36.0)
MCV: 91.2 fL (ref 80.0–100.0)
Monocytes Absolute: 0.4 10*3/uL (ref 0.1–1.0)
Monocytes Relative: 9 %
Neutro Abs: 1.8 10*3/uL (ref 1.7–7.7)
Neutrophils Relative %: 38 %
Platelet Count: 164 10*3/uL (ref 150–400)
RBC: 4.08 MIL/uL (ref 3.87–5.11)
RDW: 15.2 % (ref 11.5–15.5)
WBC Count: 4.7 10*3/uL (ref 4.0–10.5)
nRBC: 0 % (ref 0.0–0.2)

## 2020-02-10 LAB — CMP (CANCER CENTER ONLY)
ALT: 12 U/L (ref 0–44)
AST: 20 U/L (ref 15–41)
Albumin: 3.2 g/dL — ABNORMAL LOW (ref 3.5–5.0)
Alkaline Phosphatase: 71 U/L (ref 38–126)
Anion gap: 6 (ref 5–15)
BUN: 16 mg/dL (ref 8–23)
CO2: 27 mmol/L (ref 22–32)
Calcium: 9.9 mg/dL (ref 8.9–10.3)
Chloride: 108 mmol/L (ref 98–111)
Creatinine: 1.15 mg/dL — ABNORMAL HIGH (ref 0.44–1.00)
GFR, Estimated: 52 mL/min — ABNORMAL LOW (ref >60)
Glucose, Bld: 91 mg/dL (ref 70–99)
Potassium: 4 mmol/L (ref 3.5–5.1)
Sodium: 141 mmol/L (ref 135–145)
Total Bilirubin: 0.7 mg/dL (ref 0.3–1.2)
Total Protein: 7.4 g/dL (ref 6.5–8.1)

## 2020-02-10 MED ORDER — SODIUM CHLORIDE 0.9 % IV SOLN
Freq: Once | INTRAVENOUS | Status: AC
  Filled 2020-02-10: qty 250

## 2020-02-10 MED ORDER — ZOLEDRONIC ACID 4 MG/5ML IV CONC: 4.0000 mg | Freq: Once | INTRAVENOUS | Status: DC

## 2020-02-10 MED ORDER — ZOLEDRONIC ACID 4 MG/100ML IV SOLN
INTRAVENOUS | Status: AC
  Filled 2020-02-10: qty 100

## 2020-02-10 MED ORDER — ZOLEDRONIC ACID 4 MG/100ML IV SOLN
4.0000 mg | Freq: Once | INTRAVENOUS | Status: AC
  Administered 2020-02-10: 4 mg via INTRAVENOUS

## 2020-02-10 NOTE — Progress Notes (Signed)
[place] OFFICE PROGRESS NOTE   Diagnosis: Multiple myeloma  INTERVAL HISTORY:   Carrie Huber returns as scheduled.  She feels well.  No recent infection.  No pain.  She is walking up to 8 miles per day.  She last received Zometa on 08/11/2019.  No tooth or jaw pain.  Objective:  Vital signs in last 24 hours:  Blood pressure 138/86, pulse 68, temperature 97.8 F (36.6 C), temperature source Tympanic, resp. rate 20, height '5\' 7"'  (1.702 m), weight 192 lb 9.6 oz (87.4 kg), SpO2 100 %.   Resp: Lungs clear bilaterally Cardio: Regular rate and rhythm GI: No hepatosplenomegaly, no mass, nontender Vascular: No leg edema     Lab Results:  Lab Results  Component Value Date   WBC 4.7 02/10/2020   HGB 11.9 (L) 02/10/2020   HCT 37.2 02/10/2020   MCV 91.2 02/10/2020   PLT 164 02/10/2020   NEUTROABS 1.8 02/10/2020    CMP  Lab Results  Component Value Date   NA 141 11/11/2019   K 3.8 11/11/2019   CL 109 11/11/2019   CO2 23 11/11/2019   GLUCOSE 95 11/11/2019   BUN 18 11/11/2019   CREATININE 1.11 (H) 11/11/2019   CALCIUM 9.7 11/11/2019   PROT 7.6 11/11/2019   ALBUMIN 3.5 11/11/2019   AST 22 11/11/2019   ALT 17 11/11/2019   ALKPHOS 69 11/11/2019   BILITOT 0.9 11/11/2019   GFRNONAA 51 (L) 11/11/2019   GFRAA 60 (L) 11/11/2019    Medications: I have reviewed the patient's current medications.   Assessment/Plan: 1. Multiple myeloma, IgG kappa, status post 7 cycles of Revlimid/Decadron and Velcade with marked clinical and laboratory improvement. She is status post melphalan conditioning, followed by an autologous stem cell infusion 09/10/2008. She began maintenance Revlimid 01/04/2009. She has persistent mild elevation of the kappa and lambda light chains.  Revlimid placed on hold at time of TTP diagnosis  08/04/2016 SPEP with no M spike observed; stable mild elevation of lambda light chains  09/26/2016 mild elevation of lambda light chains, IgG in normal  range  Bone survey 09/09/2017-stable findings of multiple myeloma. No new osseous abnormalities. Stable multiple thoracic and lumbar spine compression fractures. Stable moderate degenerative disc disease at C5-6.   Bone survey 02/09/2019-stable, no new lytic lesions 2. Neutropenia while on Revlimid at a dose of 10 mg daily. The Revlimid dose was reduced to 5 mg in September 2011 due to persistent neutropenia and a hospital admission with pneumonia/sepsis. . 3. Admission 11/03/2009 with left lung pneumonia and sepsis syndrome. No specific organism was cultured during the hospital admission. She completed outpatient Levaquin therapy.  4. 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  5. Thoracic spine compression fracture, status post a kyphoplasty 12/05/2007. A metastatic bone survey was unchanged on04/19/2018. 6. Tiny pulmonary embolism on a CT 11/20/2007, now maintained off of Coumadin.  7. Chronic tachycardia on repeat office visits at the [facility].  8. History of hypertension. 9. 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  Prednisone started 08/07/2016   Prednisone taper to 40 mg daily beginning 08/14/2016  Plasmapheresis 08/15/2016  Plasmapheresis 08/17/2016  Prednisone taper to 30 mg daily beginning 08/21/2016  Prednisone tapered to 20 mg daily beginning 09/03/2016  Prednisone tapered to 15 mg daily  09/11/2016  Prednisone tapered to 10 mg daily 09/26/2016  Prednisone taperedto 5 mg daily beginning 11/02/2016  Prednisone tapered to 2.5 mg daily for 7 days beginning 11/28/2016, then 2.5 mg every other day for 5 doses, then discontinued  10. Altered mental  status-most likely secondary to TTP,resolved  11. Proteinuria on 24-hour urine 08/04/2016 (2.3 grams).Seen by Dr. Lorrene Reid during hospitalization June 2018.Renal ultrasound 08/09/2016 with no acute abnormality seen. She is followed by Dr. Lorrene Reid     Disposition: Carrie Huber appears stable.  There is no evidence for recurrence of the TTP.  There is no clinical evidence for progression of the myeloma.  We will follow up on the myeloma panel from today.  She will receive Zometa today.  Carrie Huber will return for lab visit in 3 months and an office visit in 6 months.  She will receive a 23 valent pneumococcal vaccine when she is here in 3 months.  She has received the COVID-19 and influenza vaccines.  She will be scheduled for a metastatic bone survey.  Betsy Coder, MD  02/10/2020  9:49 AM

## 2020-02-10 NOTE — Patient Instructions (Signed)
Zoledronic Acid injection (Hypercalcemia, Oncology) What is this medicine? ZOLEDRONIC ACID (ZOE le dron ik AS id) lowers the amount of calcium loss from bone. It is used to treat too much calcium in your blood from cancer. It is also used to prevent complications of cancer that has spread to the bone. This medicine may be used for other purposes; ask your health care provider or pharmacist if you have questions. COMMON BRAND NAME(S): Zometa What should I tell my health care provider before I take this medicine? They need to know if you have any of these conditions:  aspirin-sensitive asthma  cancer, especially if you are receiving medicines used to treat cancer  dental disease or wear dentures  infection  kidney disease  receiving corticosteroids like dexamethasone or prednisone  an unusual or allergic reaction to zoledronic acid, other medicines, foods, dyes, or preservatives  pregnant or trying to get pregnant  breast-feeding How should I use this medicine? This medicine is for infusion into a vein. It is given by a health care professional in a hospital or clinic setting. Talk to your pediatrician regarding the use of this medicine in children. Special care may be needed. Overdosage: If you think you have taken too much of this medicine contact a poison control center or emergency room at once. NOTE: This medicine is only for you. Do not share this medicine with others. What if I miss a dose? It is important not to miss your dose. Call your doctor or health care professional if you are unable to keep an appointment. What may interact with this medicine?  certain antibiotics given by injection  NSAIDs, medicines for pain and inflammation, like ibuprofen or naproxen  some diuretics like bumetanide, furosemide  teriparatide  thalidomide This list may not describe all possible interactions. Give your health care provider a list of all the medicines, herbs, non-prescription  drugs, or dietary supplements you use. Also tell them if you smoke, drink alcohol, or use illegal drugs. Some items may interact with your medicine. What should I watch for while using this medicine? Visit your doctor or health care professional for regular checkups. It may be some time before you see the benefit from this medicine. Do not stop taking your medicine unless your doctor tells you to. Your doctor may order blood tests or other tests to see how you are doing. Women should inform their doctor if they wish to become pregnant or think they might be pregnant. There is a potential for serious side effects to an unborn child. Talk to your health care professional or pharmacist for more information. You should make sure that you get enough calcium and vitamin D while you are taking this medicine. Discuss the foods you eat and the vitamins you take with your health care professional. Some people who take this medicine have severe bone, joint, and/or muscle pain. This medicine may also increase your risk for jaw problems or a broken thigh bone. Tell your doctor right away if you have severe pain in your jaw, bones, joints, or muscles. Tell your doctor if you have any pain that does not go away or that gets worse. Tell your dentist and dental surgeon that you are taking this medicine. You should not have major dental surgery while on this medicine. See your dentist to have a dental exam and fix any dental problems before starting this medicine. Take good care of your teeth while on this medicine. Make sure you see your dentist for regular follow-up   appointments. What side effects may I notice from receiving this medicine? Side effects that you should report to your doctor or health care professional as soon as possible:  allergic reactions like skin rash, itching or hives, swelling of the face, lips, or tongue  anxiety, confusion, or depression  breathing problems  changes in vision  eye  pain  feeling faint or lightheaded, falls  jaw pain, especially after dental work  mouth sores  muscle cramps, stiffness, or weakness  redness, blistering, peeling or loosening of the skin, including inside the mouth  trouble passing urine or change in the amount of urine Side effects that usually do not require medical attention (report to your doctor or health care professional if they continue or are bothersome):  bone, joint, or muscle pain  constipation  diarrhea  fever  hair loss  irritation at site where injected  loss of appetite  nausea, vomiting  stomach upset  trouble sleeping  trouble swallowing  weak or tired This list may not describe all possible side effects. Call your doctor for medical advice about side effects. You may report side effects to FDA at 1-800-FDA-1088. Where should I keep my medicine? This drug is given in a hospital or clinic and will not be stored at home. NOTE: This sheet is a summary. It may not cover all possible information. If you have questions about this medicine, talk to your doctor, pharmacist, or health care provider.  2020 Elsevier/Gold Standard (2013-07-18 14:19:39)  

## 2020-02-11 ENCOUNTER — Telehealth: Payer: Self-pay | Admitting: Oncology

## 2020-02-11 LAB — KAPPA/LAMBDA LIGHT CHAINS
Kappa free light chain: 32.8 mg/L — ABNORMAL HIGH (ref 3.3–19.4)
Kappa, lambda light chain ratio: 1.22 (ref 0.26–1.65)
Lambda free light chains: 26.8 mg/L — ABNORMAL HIGH (ref 5.7–26.3)

## 2020-02-11 LAB — IGG, IGA, IGM
IgA: 447 mg/dL — ABNORMAL HIGH (ref 87–352)
IgG (Immunoglobin G), Serum: 1379 mg/dL (ref 586–1602)
IgM (Immunoglobulin M), Srm: 56 mg/dL (ref 26–217)

## 2020-02-11 NOTE — Telephone Encounter (Signed)
Scheduled appointments per 12/8 los. Called patient, no answer. Left message for patient with appointments dates and times.

## 2020-02-12 LAB — PROTEIN ELECTROPHORESIS, SERUM
A/G Ratio: 0.9 (ref 0.7–1.7)
Albumin ELP: 3.2 g/dL (ref 2.9–4.4)
Alpha-1-Globulin: 0.2 g/dL (ref 0.0–0.4)
Alpha-2-Globulin: 0.9 g/dL (ref 0.4–1.0)
Beta Globulin: 1 g/dL (ref 0.7–1.3)
Gamma Globulin: 1.3 g/dL (ref 0.4–1.8)
Globulin, Total: 3.4 g/dL (ref 2.2–3.9)
Total Protein ELP: 6.6 g/dL (ref 6.0–8.5)

## 2020-03-08 ENCOUNTER — Inpatient Hospital Stay (HOSPITAL_COMMUNITY): Admission: RE | Admit: 2020-03-08 | Payer: BC Managed Care – PPO | Source: Ambulatory Visit

## 2020-03-29 ENCOUNTER — Other Ambulatory Visit: Payer: Self-pay | Admitting: *Deleted

## 2020-03-29 DIAGNOSIS — Z23 Encounter for immunization: Secondary | ICD-10-CM

## 2020-05-10 ENCOUNTER — Ambulatory Visit (HOSPITAL_COMMUNITY)
Admission: RE | Admit: 2020-05-10 | Discharge: 2020-05-10 | Disposition: A | Discharge status: Home / Self Care | Payer: BC Managed Care – PPO | Source: Ambulatory Visit | Attending: Oncology | Admitting: Oncology

## 2020-05-10 ENCOUNTER — Inpatient Hospital Stay: Payer: BC Managed Care – PPO

## 2020-05-10 ENCOUNTER — Inpatient Hospital Stay: Payer: BC Managed Care – PPO | Attending: Oncology

## 2020-05-10 ENCOUNTER — Other Ambulatory Visit: Payer: Self-pay

## 2020-05-10 DIAGNOSIS — C9001 Multiple myeloma in remission: Secondary | ICD-10-CM

## 2020-05-10 DIAGNOSIS — C9000 Multiple myeloma not having achieved remission: Secondary | ICD-10-CM | POA: Diagnosis present

## 2020-05-10 LAB — CMP (CANCER CENTER ONLY)
ALT: 12 U/L (ref 0–44)
AST: 18 U/L (ref 15–41)
Albumin: 3.6 g/dL (ref 3.5–5.0)
Alkaline Phosphatase: 65 U/L (ref 38–126)
Anion gap: 9 (ref 5–15)
BUN: 18 mg/dL (ref 8–23)
CO2: 26 mmol/L (ref 22–32)
Calcium: 9.7 mg/dL (ref 8.9–10.3)
Chloride: 107 mmol/L (ref 98–111)
Creatinine: 1.16 mg/dL — ABNORMAL HIGH (ref 0.44–1.00)
GFR, Estimated: 52 mL/min — ABNORMAL LOW (ref >60)
Glucose, Bld: 96 mg/dL (ref 70–99)
Potassium: 4.3 mmol/L (ref 3.5–5.1)
Sodium: 142 mmol/L (ref 135–145)
Total Bilirubin: 0.7 mg/dL (ref 0.3–1.2)
Total Protein: 7.4 g/dL (ref 6.5–8.1)

## 2020-05-10 LAB — CBC WITH DIFFERENTIAL (CANCER CENTER ONLY)
Abs Immature Granulocytes: 0 10*3/uL (ref 0.00–0.07)
Basophils Absolute: 0 10*3/uL (ref 0.0–0.1)
Basophils Relative: 1 %
Eosinophils Absolute: 0.1 10*3/uL (ref 0.0–0.5)
Eosinophils Relative: 2 %
HCT: 40.9 % (ref 36.0–46.0)
Hemoglobin: 13 g/dL (ref 12.0–15.0)
Immature Granulocytes: 0 %
Lymphocytes Relative: 56 %
Lymphs Abs: 2.3 10*3/uL (ref 0.7–4.0)
MCH: 29.3 pg (ref 26.0–34.0)
MCHC: 31.8 g/dL (ref 30.0–36.0)
MCV: 92.1 fL (ref 80.0–100.0)
Monocytes Absolute: 0.6 10*3/uL (ref 0.1–1.0)
Monocytes Relative: 14 %
Neutro Abs: 1.1 10*3/uL — ABNORMAL LOW (ref 1.7–7.7)
Neutrophils Relative %: 27 %
Platelet Count: 156 10*3/uL (ref 150–400)
RBC: 4.44 MIL/uL (ref 3.87–5.11)
RDW: 14.9 % (ref 11.5–15.5)
WBC Count: 4 10*3/uL (ref 4.0–10.5)
nRBC: 0 % (ref 0.0–0.2)

## 2020-05-10 NOTE — Progress Notes (Signed)
Pt wants to wait until her next visit with Dr. Benay Spice to receive her pneumonia vaccine.

## 2020-05-11 LAB — KAPPA/LAMBDA LIGHT CHAINS
Kappa free light chain: 27.6 mg/L — ABNORMAL HIGH (ref 3.3–19.4)
Kappa, lambda light chain ratio: 1.22 (ref 0.26–1.65)
Lambda free light chains: 22.7 mg/L (ref 5.7–26.3)

## 2020-05-12 LAB — PROTEIN ELECTROPHORESIS, SERUM
A/G Ratio: 1.1 (ref 0.7–1.7)
Albumin ELP: 3.7 g/dL (ref 2.9–4.4)
Alpha-1-Globulin: 0.2 g/dL (ref 0.0–0.4)
Alpha-2-Globulin: 0.8 g/dL (ref 0.4–1.0)
Beta Globulin: 1.1 g/dL (ref 0.7–1.3)
Gamma Globulin: 1.3 g/dL (ref 0.4–1.8)
Globulin, Total: 3.4 g/dL (ref 2.2–3.9)
Total Protein ELP: 7.1 g/dL (ref 6.0–8.5)

## 2020-08-09 ENCOUNTER — Other Ambulatory Visit: Payer: BC Managed Care – PPO

## 2020-08-09 ENCOUNTER — Ambulatory Visit: Payer: BC Managed Care – PPO | Admitting: Nurse Practitioner

## 2020-08-09 ENCOUNTER — Encounter: Payer: Self-pay | Admitting: Nurse Practitioner

## 2020-08-09 ENCOUNTER — Other Ambulatory Visit: Payer: Self-pay

## 2020-08-09 ENCOUNTER — Inpatient Hospital Stay: Payer: BC Managed Care – PPO | Attending: Oncology | Admitting: Nurse Practitioner

## 2020-08-09 ENCOUNTER — Inpatient Hospital Stay: Payer: BC Managed Care – PPO

## 2020-08-09 VITALS — BP 137/75 | HR 66 | Temp 97.8°F | Resp 18 | Ht 67.0 in | Wt 195.6 lb

## 2020-08-09 DIAGNOSIS — C9001 Multiple myeloma in remission: Secondary | ICD-10-CM | POA: Diagnosis not present

## 2020-08-09 DIAGNOSIS — C9000 Multiple myeloma not having achieved remission: Secondary | ICD-10-CM | POA: Insufficient documentation

## 2020-08-09 LAB — CBC WITH DIFFERENTIAL (CANCER CENTER ONLY)
Abs Immature Granulocytes: 0 10*3/uL (ref 0.00–0.07)
Basophils Absolute: 0 10*3/uL (ref 0.0–0.1)
Basophils Relative: 0 %
Eosinophils Absolute: 0.1 10*3/uL (ref 0.0–0.5)
Eosinophils Relative: 1 %
HCT: 41.9 % (ref 36.0–46.0)
Hemoglobin: 13.7 g/dL (ref 12.0–15.0)
Immature Granulocytes: 0 %
Lymphocytes Relative: 59 %
Lymphs Abs: 2.2 10*3/uL (ref 0.7–4.0)
MCH: 30.1 pg (ref 26.0–34.0)
MCHC: 32.7 g/dL (ref 30.0–36.0)
MCV: 92.1 fL (ref 80.0–100.0)
Monocytes Absolute: 0.4 10*3/uL (ref 0.1–1.0)
Monocytes Relative: 11 %
Neutro Abs: 1.1 10*3/uL — ABNORMAL LOW (ref 1.7–7.7)
Neutrophils Relative %: 29 %
Platelet Count: 161 10*3/uL (ref 150–400)
RBC: 4.55 MIL/uL (ref 3.87–5.11)
RDW: 14.2 % (ref 11.5–15.5)
WBC Count: 3.7 10*3/uL — ABNORMAL LOW (ref 4.0–10.5)
nRBC: 0 % (ref 0.0–0.2)

## 2020-08-09 LAB — CMP (CANCER CENTER ONLY)
ALT: 14 U/L (ref 0–44)
AST: 21 U/L (ref 15–41)
Albumin: 4 g/dL (ref 3.5–5.0)
Alkaline Phosphatase: 65 U/L (ref 38–126)
Anion gap: 7 (ref 5–15)
BUN: 17 mg/dL (ref 8–23)
CO2: 25 mmol/L (ref 22–32)
Calcium: 9.8 mg/dL (ref 8.9–10.3)
Chloride: 109 mmol/L (ref 98–111)
Creatinine: 1.11 mg/dL — ABNORMAL HIGH (ref 0.44–1.00)
GFR, Estimated: 54 mL/min — ABNORMAL LOW (ref >60)
Glucose, Bld: 91 mg/dL (ref 70–99)
Potassium: 4.4 mmol/L (ref 3.5–5.1)
Sodium: 141 mmol/L (ref 135–145)
Total Bilirubin: 0.8 mg/dL (ref 0.3–1.2)
Total Protein: 7.2 g/dL (ref 6.5–8.1)

## 2020-08-09 NOTE — Progress Notes (Signed)
[place] OFFICE PROGRESS NOTE   Diagnosis: Multiple myeloma  INTERVAL HISTORY:   Carrie Huber returns as scheduled.  She feels well.  No interim illnesses or infection.  No areas of pain.  She last received Zometa 02/10/2020.  No dental issues.  No jaw pain.  Objective:  Vital signs in last 24 hours:  Blood pressure 137/75, pulse 66, temperature 97.8 F (36.6 C), temperature source Oral, resp. rate 18, height '5\' 7"'  (1.702 m), weight 195 lb 9.6 oz (88.7 kg), SpO2 98 %.    HEENT: No thrush or ulcers. Resp: Lungs clear bilaterally. Cardio: Regular rate and rhythm. GI: Abdomen soft and nontender.  No hepatomegaly.  No splenomegaly. Vascular: No leg edema.  Left lower leg is slightly larger than the right lower leg (chronic).  Lab Results:  Lab Results  Component Value Date   WBC 3.7 (L) 08/09/2020   HGB 13.7 08/09/2020   HCT 41.9 08/09/2020   MCV 92.1 08/09/2020   PLT 161 08/09/2020   NEUTROABS 1.1 (L) 08/09/2020    Imaging:  No results found.  Medications: I have reviewed the patient's current medications.  Assessment/Plan: 1. Multiple myeloma, IgG kappa, status post 7 cycles of Revlimid/Decadron and Velcade with marked clinical and laboratory improvement. She is status post melphalan conditioning, followed by an autologous stem cell infusion 09/10/2008. She began maintenance Revlimid 01/04/2009. She has persistent mild elevation of the kappa and lambda light chains.  Revlimid placed on hold at time of TTP diagnosis  08/04/2016 SPEP with no M spike observed; stable mild elevation of lambda light chains  09/26/2016 mild elevation of lambda light chains, IgG in normal range  Bone survey 09/09/2017-stable findings of multiple myeloma. No new osseous abnormalities. Stable multiple thoracic and lumbar spine compression fractures. Stable moderate degenerative disc disease at C5-6.  Bone survey 02/09/2019-stable, no new lytic lesions  Bone survey  05/10/2020- stable, no new lytic lesions; slight interval increase in endplate depression of T4 superior endplate fracture, nonspecific. 2. Neutropenia while on Revlimid at a dose of 10 mg daily. The Revlimid dose was reduced to 5 mg in September 2011 due to persistent neutropenia and a hospital admission with pneumonia/sepsis. . 3. Admission 11/03/2009 with left lung pneumonia and sepsis syndrome. No specific organism was cultured during the hospital admission. She completed outpatient Levaquin therapy.  4. 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  5. Thoracic spine compression fracture, status post a kyphoplasty 12/05/2007. A metastatic bone survey was unchanged on04/19/2018. 6. Tiny pulmonary embolism on a CT 11/20/2007, now maintained off of Coumadin.  7. Chronic tachycardia on repeat office visits at the [facility].  8. History of hypertension. 9. 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  Prednisone started 08/07/2016   Prednisone taper to 40 mg daily beginning 08/14/2016  Plasmapheresis 08/15/2016  Plasmapheresis 08/17/2016  Prednisone taper to 30 mg daily beginning 08/21/2016  Prednisone tapered to 20 mg daily beginning 09/03/2016  Prednisone tapered to 15 mg daily 09/11/2016  Prednisone tapered to 10 mg daily 09/26/2016  Prednisone taperedto 5 mg daily beginning 11/02/2016  Prednisone tapered to 2.5 mg daily for 7 days beginning 11/28/2016, then 2.5 mg every other day for 5 doses, then discontinued  10. Altered mental status-most likely secondary to TTP,resolved  11. Proteinuria  on 24-hour urine 08/04/2016 (2.3 grams).Seen by Dr. Lorrene Reid during  hospitalization June 2018.Renal ultrasound 08/09/2016 with no acute abnormality seen. She is followed by Dr. Lorrene Reid    Disposition: Carrie Huber appears stable.  TTP remains in remission.  There is no clinical evidence for progression of the myeloma.  We will follow-up on the myeloma panel from today.  She receives Zometa on a 76-monthschedule and will return for the next infusion in approximately 2 weeks.  By our records she is due for a 23 valent pneumococcal vaccine.  She thinks she may have received this with her PCP.  She will contact that office to confirm.  We have tentatively scheduled her for the pneumococcal vaccine when she returns for Zometa in 2 weeks.  We reviewed the CBC from today.  She has stable mild to moderate thrombocytopenia.  She understands to contact the office with signs of infection.  She will return for lab in 3 months.  We will see her in follow-up in 6 months.  Zometa and pneumococcal vaccine as outlined above.    LNed CardANP/GNP-BC   08/09/2020  11:48 AM

## 2020-08-10 LAB — PROTEIN ELECTROPHORESIS, SERUM
A/G Ratio: 1.1 (ref 0.7–1.7)
Albumin ELP: 3.7 g/dL (ref 2.9–4.4)
Alpha-1-Globulin: 0.2 g/dL (ref 0.0–0.4)
Alpha-2-Globulin: 0.8 g/dL (ref 0.4–1.0)
Beta Globulin: 1.1 g/dL (ref 0.7–1.3)
Gamma Globulin: 1.3 g/dL (ref 0.4–1.8)
Globulin, Total: 3.3 g/dL (ref 2.2–3.9)
Total Protein ELP: 7 g/dL (ref 6.0–8.5)

## 2020-08-10 LAB — KAPPA/LAMBDA LIGHT CHAINS
Kappa free light chain: 27.5 mg/L — ABNORMAL HIGH (ref 3.3–19.4)
Kappa, lambda light chain ratio: 1.25 (ref 0.26–1.65)
Lambda free light chains: 22 mg/L (ref 5.7–26.3)

## 2020-08-25 ENCOUNTER — Inpatient Hospital Stay: Payer: BC Managed Care – PPO

## 2020-08-26 ENCOUNTER — Other Ambulatory Visit: Payer: Self-pay

## 2020-08-26 ENCOUNTER — Inpatient Hospital Stay: Payer: BC Managed Care – PPO

## 2020-08-26 DIAGNOSIS — C9000 Multiple myeloma not having achieved remission: Secondary | ICD-10-CM | POA: Diagnosis not present

## 2020-08-26 DIAGNOSIS — C9001 Multiple myeloma in remission: Secondary | ICD-10-CM

## 2020-08-26 MED ORDER — SODIUM CHLORIDE 0.9 % IV SOLN
INTRAVENOUS | Status: DC
  Filled 2020-08-26: qty 250

## 2020-08-26 MED ORDER — ZOLEDRONIC ACID 4 MG/5ML IV CONC
4.0000 mg | Freq: Once | INTRAVENOUS | Status: AC
  Administered 2020-08-26: 4 mg via INTRAVENOUS
  Filled 2020-08-26: qty 5

## 2020-08-26 NOTE — Patient Instructions (Signed)
[facility]  Discharge Instructions: Thank you for choosing [facility] to provide your oncology and hematology care.   If you have a lab appointment with the [facility], please go directly to the [facility] and check in at the registration area.   Wear comfortable clothing and clothing appropriate for easy access to any Portacath or PICC line.   We strive to give you quality time with your provider. You may need to reschedule your appointment if you arrive late (15 or more minutes).  Arriving late affects you and other patients whose appointments are after yours.  Also, if you miss three or more appointments without notifying the office, you may be dismissed from the clinic at the provider's discretion.      For prescription refill requests, have your pharmacy contact our office and allow 72 hours for refills to be completed.    Today you received the following chemotherapy and/or immunotherapy agents ZOMETA      To help prevent nausea and vomiting after your treatment, we encourage you to take your nausea medication as directed.  BELOW ARE SYMPTOMS THAT SHOULD BE REPORTED IMMEDIATELY: *FEVER GREATER THAN 100.4 F (38 C) OR HIGHER *CHILLS OR SWEATING *NAUSEA AND VOMITING THAT IS NOT CONTROLLED WITH YOUR NAUSEA MEDICATION *UNUSUAL SHORTNESS OF BREATH *UNUSUAL BRUISING OR BLEEDING *URINARY PROBLEMS (pain or burning when urinating, or frequent urination) *BOWEL PROBLEMS (unusual diarrhea, constipation, pain near the anus) TENDERNESS IN MOUTH AND THROAT WITH OR WITHOUT PRESENCE OF ULCERS (sore throat, sores in mouth, or a toothache) UNUSUAL RASH, SWELLING OR PAIN  UNUSUAL VAGINAL DISCHARGE OR ITCHING   Items with * indicate a potential emergency and should be followed up as soon as possible or go to the Emergency Department if any problems should occur.  Please show the CHEMOTHERAPY ALERT CARD or IMMUNOTHERAPY ALERT CARD at check-in to the  Emergency Department and triage nurse.  Should you have questions after your visit or need to cancel or reschedule your appointment, please contact [facility]  Dept: [phone]  and follow the prompts.  Office hours are 8:00 a.m. to 4:30 p.m. Monday - Friday. Please note that voicemails left after 4:00 p.m. may not be returned until the following business day.  We are closed weekends and major holidays. You have access to a nurse at all times for urgent questions. Please call the main number to the clinic Dept: [phone] and follow the prompts.   For any non-urgent questions, you may also contact your provider using MyChart. We now offer e-Visits for anyone 46 and older to request care online for non-urgent symptoms. For details visit [email or web address].   Also download the MyChart app! Go to the app store, search "MyChart", open the app, select [facility], and log in with your MyChart username and password.  Due to Covid, a mask is required upon entering the hospital/clinic. If you do not have a mask, one will be given to you upon arrival. For doctor visits, patients may have 1 support person aged 47 or older with them. For treatment visits, patients cannot have anyone with them due to current Covid guidelines and our immunocompromised population.   Zoledronic Acid Injection (Hypercalcemia, Oncology) What is this medication? ZOLEDRONIC ACID (ZOE le dron ik AS id) slows calcium loss from bones. It high calcium levels in the blood from some kinds of cancer. It may be used in otherpeople at risk for bone loss. This medicine may be  used for other purposes; ask your health care provider orpharmacist if you have questions. COMMON BRAND NAME(S): Zometa What should I tell my care team before I take this medication? They need to know if you have any of these conditions: cancer dehydration dental disease kidney disease liver disease low levels of calcium in the  blood lung or breathing disease (asthma) receiving steroids like dexamethasone or prednisone an unusual or allergic reaction to zoledronic acid, other medicines, foods, dyes, or preservatives pregnant or trying to get pregnant breast-feeding How should I use this medication? This drug is injected into a vein. It is given by a health care provider in [facility] or clinic setting. Talk to your health care provider about the use of this drug in children.Special care may be needed. Overdosage: If you think you have taken too much of this medicine contact apoison control center or emergency room at once. NOTE: This medicine is only for you. Do not share this medicine with others. What if I miss a dose? Keep appointments for follow-up doses. It is important not to miss your dose.Call your health care provider if you are unable to keep an appointment. What may interact with this medication? certain antibiotics given by injection NSAIDs, medicines for pain and inflammation, like ibuprofen or naproxen some diuretics like bumetanide, furosemide teriparatide thalidomide This list may not describe all possible interactions. Give your health care provider a list of all the medicines, herbs, non-prescription drugs, or dietary supplements you use. Also tell them if you smoke, drink alcohol, or use illegaldrugs. Some items may interact with your medicine. What should I watch for while using this medication? Visit your health care provider for regular checks on your progress. It may besome time before you see the benefit from this drug. Some people who take this drug have severe bone, joint, or muscle pain. This drug may also increase your risk for jaw problems or a broken thigh bone. Tell your health care provider right away if you have severe pain in your jaw, bones, joints, or muscles. Tell you health care provider if you have any painthat does not go away or that gets worse. Tell your dentist and dental  surgeon that you are taking this drug. You should not have major dental surgery while on this drug. See your dentist to have a dental exam and fix any dental problems before starting this drug. Take good care of your teeth while on this drug. Make sure you see your dentist forregular follow-up appointments. You should make sure you get enough calcium and vitamin D while you are taking this drug. Discuss the foods you eat and the vitamins you take with your healthcare provider. Check with your health care provider if you have severe diarrhea, nausea, and vomiting, or if you sweat a lot. The loss of too much body fluid may make itdangerous for you to take this drug. You may need blood work done while you are taking this drug. Do not become pregnant while taking this drug. Women should inform their health care provider if they wish to become pregnant or think they might be pregnant. There is potential for serious harm to an unborn child. Talk to your healthcare provider for more information. What side effects may I notice from receiving this medication? Side effects that you should report to your doctor or health care provider assoon as possible: allergic reactions (skin rash, itching or hives; swelling of the face, lips, or tongue) bone pain infection (fever, chills, cough, sore  throat, pain or trouble passing urine) jaw pain, especially after dental work joint pain kidney injury (trouble passing urine or change in the amount of urine) low blood pressure (dizziness; feeling faint or lightheaded, falls; unusually weak or tired) low calcium levels (fast heartbeat; muscle cramps or pain; pain, tingling, or numbness in the hands or feet; seizures) low magnesium levels (fast, irregular heartbeat; muscle cramp or pain; muscle weakness; tremors; seizures) low red blood cell counts (trouble breathing; feeling faint; lightheaded, falls; unusually weak or tired) muscle pain redness, blistering, peeling, or  loosening of the skin, including inside the mouth severe diarrhea swelling of the ankles, feet, hands trouble breathing Side effects that usually do not require medical attention (report to yourdoctor or health care provider if they continue or are bothersome): anxious constipation coughing depressed mood eye irritation, itching, or pain fever general ill feeling or flu-like symptoms nausea pain, redness, or irritation at site where injected trouble sleeping This list may not describe all possible side effects. Call your doctor for medical advice about side effects. You may report side effects to FDA at1-800-FDA-1088. Where should I keep my medication? This drug is given in a hospital or clinic. It will not be stored at home. NOTE: This sheet is a summary. It may not cover all possible information. If you have questions about this medicine, talk to your doctor, pharmacist, orhealth care provider.  2022 Elsevier/Gold Standard (2018-12-04 09:13:00)

## 2020-11-09 ENCOUNTER — Inpatient Hospital Stay: Payer: BC Managed Care – PPO

## 2020-11-10 ENCOUNTER — Inpatient Hospital Stay: Payer: BC Managed Care – PPO | Attending: Oncology

## 2020-11-10 ENCOUNTER — Other Ambulatory Visit: Payer: Self-pay

## 2020-11-10 DIAGNOSIS — C9000 Multiple myeloma not having achieved remission: Secondary | ICD-10-CM | POA: Diagnosis not present

## 2020-11-10 DIAGNOSIS — C9001 Multiple myeloma in remission: Secondary | ICD-10-CM

## 2020-11-10 LAB — CBC WITH DIFFERENTIAL (CANCER CENTER ONLY)
Abs Immature Granulocytes: 0 10*3/uL (ref 0.00–0.07)
Basophils Absolute: 0 10*3/uL (ref 0.0–0.1)
Basophils Relative: 1 %
Eosinophils Absolute: 0.1 10*3/uL (ref 0.0–0.5)
Eosinophils Relative: 2 %
HCT: 40.9 % (ref 36.0–46.0)
Hemoglobin: 13.1 g/dL (ref 12.0–15.0)
Immature Granulocytes: 0 %
Lymphocytes Relative: 52 %
Lymphs Abs: 2.3 10*3/uL (ref 0.7–4.0)
MCH: 29.8 pg (ref 26.0–34.0)
MCHC: 32 g/dL (ref 30.0–36.0)
MCV: 93.2 fL (ref 80.0–100.0)
Monocytes Absolute: 0.5 10*3/uL (ref 0.1–1.0)
Monocytes Relative: 12 %
Neutro Abs: 1.4 10*3/uL — ABNORMAL LOW (ref 1.7–7.7)
Neutrophils Relative %: 33 %
Platelet Count: 155 10*3/uL (ref 150–400)
RBC: 4.39 MIL/uL (ref 3.87–5.11)
RDW: 14.3 % (ref 11.5–15.5)
WBC Count: 4.3 10*3/uL (ref 4.0–10.5)
nRBC: 0 % (ref 0.0–0.2)

## 2020-12-10 ENCOUNTER — Other Ambulatory Visit: Payer: Self-pay | Admitting: Nurse Practitioner

## 2021-02-08 ENCOUNTER — Inpatient Hospital Stay: Payer: BC Managed Care – PPO

## 2021-02-08 ENCOUNTER — Inpatient Hospital Stay: Payer: BC Managed Care – PPO | Admitting: Oncology

## 2021-02-08 ENCOUNTER — Other Ambulatory Visit: Payer: Self-pay

## 2021-02-08 ENCOUNTER — Inpatient Hospital Stay: Payer: BC Managed Care – PPO | Attending: Oncology

## 2021-02-08 VITALS — BP 128/85 | HR 64 | Temp 98.0°F | Resp 18

## 2021-02-08 VITALS — BP 139/77 | HR 81 | Temp 98.7°F | Resp 20 | Ht 67.0 in | Wt 200.9 lb

## 2021-02-08 DIAGNOSIS — C9000 Multiple myeloma not having achieved remission: Secondary | ICD-10-CM | POA: Diagnosis not present

## 2021-02-08 DIAGNOSIS — C9001 Multiple myeloma in remission: Secondary | ICD-10-CM | POA: Diagnosis not present

## 2021-02-08 LAB — CBC WITH DIFFERENTIAL (CANCER CENTER ONLY)
Abs Immature Granulocytes: 0.01 10*3/uL (ref 0.00–0.07)
Basophils Absolute: 0 10*3/uL (ref 0.0–0.1)
Basophils Relative: 1 %
Eosinophils Absolute: 0.1 10*3/uL (ref 0.0–0.5)
Eosinophils Relative: 2 %
HCT: 36.2 % (ref 36.0–46.0)
Hemoglobin: 11.8 g/dL — ABNORMAL LOW (ref 12.0–15.0)
Immature Granulocytes: 0 %
Lymphocytes Relative: 45 %
Lymphs Abs: 1.8 10*3/uL (ref 0.7–4.0)
MCH: 29.9 pg (ref 26.0–34.0)
MCHC: 32.6 g/dL (ref 30.0–36.0)
MCV: 91.9 fL (ref 80.0–100.0)
Monocytes Absolute: 0.5 10*3/uL (ref 0.1–1.0)
Monocytes Relative: 12 %
Neutro Abs: 1.5 10*3/uL — ABNORMAL LOW (ref 1.7–7.7)
Neutrophils Relative %: 40 %
Platelet Count: 162 10*3/uL (ref 150–400)
RBC: 3.94 MIL/uL (ref 3.87–5.11)
RDW: 14.2 % (ref 11.5–15.5)
WBC Count: 3.8 10*3/uL — ABNORMAL LOW (ref 4.0–10.5)
nRBC: 0 % (ref 0.0–0.2)

## 2021-02-08 LAB — CMP (CANCER CENTER ONLY)
ALT: 12 U/L (ref 0–44)
AST: 19 U/L (ref 15–41)
Albumin: 3.9 g/dL (ref 3.5–5.0)
Alkaline Phosphatase: 95 U/L (ref 38–126)
Anion gap: 8 (ref 5–15)
BUN: 17 mg/dL (ref 8–23)
CO2: 24 mmol/L (ref 22–32)
Calcium: 10.2 mg/dL (ref 8.9–10.3)
Chloride: 109 mmol/L (ref 98–111)
Creatinine: 1.08 mg/dL — ABNORMAL HIGH (ref 0.44–1.00)
GFR, Estimated: 56 mL/min — ABNORMAL LOW (ref >60)
Glucose, Bld: 94 mg/dL (ref 70–99)
Potassium: 3.8 mmol/L (ref 3.5–5.1)
Sodium: 141 mmol/L (ref 135–145)
Total Bilirubin: 0.9 mg/dL (ref 0.3–1.2)
Total Protein: 6.7 g/dL (ref 6.5–8.1)

## 2021-02-08 MED ORDER — ZOLEDRONIC ACID 4 MG/5ML IV CONC
4.0000 mg | Freq: Once | INTRAVENOUS | Status: AC
  Administered 2021-02-08: 4 mg via INTRAVENOUS
  Filled 2021-02-08: qty 5

## 2021-02-08 MED ORDER — SODIUM CHLORIDE 0.9 % IV SOLN: INTRAVENOUS | Status: DC

## 2021-02-08 NOTE — Patient Instructions (Signed)

## 2021-02-08 NOTE — Progress Notes (Signed)
[facility] OFFICE PROGRESS NOTE   Diagnosis: Multiple myeloma  INTERVAL HISTORY:   Carrie Huber returns as scheduled.  She feels well.  No pain.  No recent infection.  She has received the COVID-19 and influenza vaccines.  No jaw or tooth pain.  Objective:  Vital signs in last 24 hours:  Blood pressure 139/77, pulse 81, temperature 98.7 F (37.1 C), temperature source Oral, resp. rate 20, height _0  (1.702 m), weight 200 lb 14.4 oz (91.1 kg), SpO2 100 %.    Resp: Lungs clear bilaterally Cardio: Regular rate and rhythm GI: No hepatosplenomegaly Vascular: No leg edema, the left lower leg is larger than the right side   Lab Results:  Lab Results  Component Value Date   WBC 3.8 (L) 02/08/2021   HGB 11.8 (L) 02/08/2021   HCT 36.2 02/08/2021   MCV 91.9 02/08/2021   PLT 162 02/08/2021   NEUTROABS 1.5 (L) 02/08/2021    CMP  Lab Results  Component Value Date   NA 141 08/09/2020   K 4.4 08/09/2020   CL 109 08/09/2020   CO2 25 08/09/2020   GLUCOSE 91 08/09/2020   BUN 17 08/09/2020   CREATININE 1.11 (H) 08/09/2020   CALCIUM 9.8 08/09/2020   PROT 7.2 08/09/2020   ALBUMIN 4.0 08/09/2020   AST 21 08/09/2020   ALT 14 08/09/2020   ALKPHOS 65 08/09/2020   BILITOT 0.8 08/09/2020   GFRNONAA 54 (L) 08/09/2020   GFRAA 60 (L) 11/11/2019    Medications: I have reviewed the patient's current medications.   Assessment/Plan: Multiple myeloma, IgG kappa, status post 7 cycles of Revlimid/Decadron and Velcade with marked clinical and laboratory improvement. She is status post melphalan conditioning, followed by an autologous stem cell infusion 09/10/2008. She began maintenance Revlimid 01/04/2009. She has persistent mild elevation of the kappa and lambda light chains. Revlimid placed on hold at time of TTP diagnosis 08/04/2016 SPEP with no M spike observed; stable mild elevation of lambda light chains 09/26/2016 mild elevation of lambda light chains, IgG in normal  range Bone survey 09/09/2017- stable findings of multiple myeloma.  No new osseous abnormalities.  Stable multiple thoracic and lumbar spine compression fractures.  Stable moderate degenerative disc disease at C5-6.  Bone survey 02/09/2019-stable, no new lytic lesions Bone survey 05/10/2020- stable, no new lytic lesions; slight interval increase in endplate depression of T4 superior endplate fracture, nonspecific. Neutropenia while on Revlimid at a dose of 10 mg daily. The Revlimid dose was reduced to 5 mg in September 2011 due to persistent neutropenia and a hospital admission with pneumonia/sepsis.  . Admission 11/03/2009 with left lung pneumonia and sepsis syndrome. No specific organism was cultured during the hospital admission. She completed outpatient Levaquin therapy.   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   Thoracic spine compression fracture, status post a kyphoplasty 12/05/2007. A metastatic bone survey was unchanged on 06/21/2016. Tiny pulmonary embolism on a CT 11/20/2007, now maintained off of Coumadin.   Chronic tachycardia on repeat office visits at the [facility].   History of hypertension. 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 Prednisone started 08/07/2016  Prednisone taper to 40 mg daily beginning 08/14/2016 Plasmapheresis 08/15/2016 Plasmapheresis 08/17/2016 Prednisone taper to 30 mg daily beginning 08/21/2016 Prednisone tapered to 20 mg daily  beginning 09/03/2016 Prednisone tapered to 15 mg daily 09/11/2016 Prednisone tapered to 10 mg daily 09/26/2016 Prednisone tapered to 5 mg daily beginning 11/02/2016 Prednisone tapered to 2.5 mg daily for 7 days  beginning 11/28/2016, then 2.5 mg every other day for 5 doses, then discontinued   10. Altered mental status-most likely secondary to TTP, resolved   11. Proteinuria on 24-hour urine 08/04/2016 (2.3 grams). Seen by Dr. Lorrene Reid during hospitalization June 2018. Renal ultrasound 08/09/2016 with no acute abnormality seen. She is followed by Dr. Lorrene Reid       Disposition: Carrie Huber remains in clinical remission from multiple myeloma.  We will follow-up on the myeloma panel from today.  She continues every 6 months Zometa.  She will be scheduled for a bone survey in March 2023.  Carrie Huber will return for an office visit and Zometa in 6 months.  She is due for a 23 valent pneumococcal vaccine and will schedule this at the [facility].  Betsy Coder, MD  02/08/2021  10:00 AM

## 2021-02-09 LAB — KAPPA/LAMBDA LIGHT CHAINS
Kappa free light chain: 29.2 mg/L — ABNORMAL HIGH (ref 3.3–19.4)
Kappa, lambda light chain ratio: 1.12 (ref 0.26–1.65)
Lambda free light chains: 26 mg/L (ref 5.7–26.3)

## 2021-02-10 LAB — PROTEIN ELECTROPHORESIS, SERUM
A/G Ratio: 1 (ref 0.7–1.7)
Albumin ELP: 3.3 g/dL (ref 2.9–4.4)
Alpha-1-Globulin: 0.2 g/dL (ref 0.0–0.4)
Alpha-2-Globulin: 0.8 g/dL (ref 0.4–1.0)
Beta Globulin: 1.1 g/dL (ref 0.7–1.3)
Gamma Globulin: 1.3 g/dL (ref 0.4–1.8)
Globulin, Total: 3.4 g/dL (ref 2.2–3.9)
Total Protein ELP: 6.7 g/dL (ref 6.0–8.5)

## 2021-06-22 ENCOUNTER — Other Ambulatory Visit: Payer: Self-pay | Admitting: Nurse Practitioner

## 2021-08-09 ENCOUNTER — Inpatient Hospital Stay: Payer: BC Managed Care – PPO

## 2021-08-16 ENCOUNTER — Encounter: Payer: Self-pay | Admitting: Nurse Practitioner

## 2021-08-16 ENCOUNTER — Other Ambulatory Visit: Payer: Self-pay

## 2021-08-16 ENCOUNTER — Inpatient Hospital Stay: Payer: BC Managed Care – PPO | Attending: Nurse Practitioner | Admitting: Nurse Practitioner

## 2021-08-16 ENCOUNTER — Inpatient Hospital Stay: Payer: BC Managed Care – PPO

## 2021-08-16 ENCOUNTER — Telehealth: Payer: Self-pay

## 2021-08-16 VITALS — BP 144/84 | HR 72 | Temp 98.2°F | Resp 18 | Ht 67.0 in | Wt 202.8 lb

## 2021-08-16 DIAGNOSIS — C9001 Multiple myeloma in remission: Secondary | ICD-10-CM | POA: Diagnosis present

## 2021-08-16 DIAGNOSIS — I10 Essential (primary) hypertension: Secondary | ICD-10-CM | POA: Diagnosis not present

## 2021-08-16 LAB — CBC WITH DIFFERENTIAL (CANCER CENTER ONLY)
Abs Immature Granulocytes: 0.01 10*3/uL (ref 0.00–0.07)
Basophils Absolute: 0 10*3/uL (ref 0.0–0.1)
Basophils Relative: 0 %
Eosinophils Absolute: 0.1 10*3/uL (ref 0.0–0.5)
Eosinophils Relative: 2 %
HCT: 41.3 % (ref 36.0–46.0)
Hemoglobin: 13.3 g/dL (ref 12.0–15.0)
Immature Granulocytes: 0 %
Lymphocytes Relative: 57 %
Lymphs Abs: 2.3 10*3/uL (ref 0.7–4.0)
MCH: 29.5 pg (ref 26.0–34.0)
MCHC: 32.2 g/dL (ref 30.0–36.0)
MCV: 91.6 fL (ref 80.0–100.0)
Monocytes Absolute: 0.3 10*3/uL (ref 0.1–1.0)
Monocytes Relative: 8 %
Neutro Abs: 1.4 10*3/uL — ABNORMAL LOW (ref 1.7–7.7)
Neutrophils Relative %: 33 %
Platelet Count: 162 10*3/uL (ref 150–400)
RBC: 4.51 MIL/uL (ref 3.87–5.11)
RDW: 13.9 % (ref 11.5–15.5)
WBC Count: 4.1 10*3/uL (ref 4.0–10.5)
nRBC: 0 % (ref 0.0–0.2)

## 2021-08-16 LAB — CMP (CANCER CENTER ONLY)
ALT: 11 U/L (ref 0–44)
AST: 18 U/L (ref 15–41)
Albumin: 4.2 g/dL (ref 3.5–5.0)
Alkaline Phosphatase: 56 U/L (ref 38–126)
Anion gap: 12 (ref 5–15)
BUN: 19 mg/dL (ref 8–23)
CO2: 21 mmol/L — ABNORMAL LOW (ref 22–32)
Calcium: 10.1 mg/dL (ref 8.9–10.3)
Chloride: 109 mmol/L (ref 98–111)
Creatinine: 1.11 mg/dL — ABNORMAL HIGH (ref 0.44–1.00)
GFR, Estimated: 54 mL/min — ABNORMAL LOW (ref >60)
Glucose, Bld: 93 mg/dL (ref 70–99)
Potassium: 3.8 mmol/L (ref 3.5–5.1)
Sodium: 142 mmol/L (ref 135–145)
Total Bilirubin: 0.9 mg/dL (ref 0.3–1.2)
Total Protein: 7.7 g/dL (ref 6.5–8.1)

## 2021-08-16 MED ORDER — SODIUM CHLORIDE 0.9 % IV SOLN: INTRAVENOUS | Status: DC

## 2021-08-16 MED ORDER — ZOLEDRONIC ACID 4 MG/5ML IV CONC
4.0000 mg | Freq: Once | INTRAVENOUS | Status: AC
  Administered 2021-08-16: 4 mg via INTRAVENOUS
  Filled 2021-08-16: qty 5

## 2021-08-16 NOTE — Telephone Encounter (Signed)
Mrs. Carrie Huber is schedule for Bone Survey Met at [facility] on 08/22/21 at 1000 and Needs to arrived at 930. Patient gave verbal understanding and had no further questions or concerns 

## 2021-08-16 NOTE — Patient Instructions (Signed)

## 2021-08-16 NOTE — Progress Notes (Signed)
Carrie Huber   Diagnosis: Multiple myeloma  INTERVAL HISTORY:   Carrie Huber returns as scheduled.  She feels well.  No interim illnesses or infections.  No fevers or sweats.  She denies pain.  No mouth, tooth, gum, jaw discomfort.  She reports her brother recently died unexpectedly, cause of death unknown.  Objective:  Vital signs in last 24 hours:  Blood pressure (!) 144/84, pulse 72, temperature 98.2 F (36.8 C), temperature source Oral, resp. rate 18, height '5\' 7"'  (1.702 m), weight 202 lb 12.8 oz (92 kg), SpO2 100 %.    HEENT: No thrush or ulcers. Lymphatics: No palpable cervical, supraclavicular, axillary lymph nodes. Resp: Lungs clear bilaterally. Cardio: Regular rate and rhythm. GI: Abdomen soft and nontender.  No hepatosplenomegaly. Vascular: No leg edema.  Left lower leg is slightly larger than the right lower leg (chronic). Skin: No rash.   Lab Results:  Lab Results  Component Value Date   WBC 4.1 08/16/2021   HGB 13.3 08/16/2021   HCT 41.3 08/16/2021   MCV 91.6 08/16/2021   PLT 162 08/16/2021   NEUTROABS 1.4 (L) 08/16/2021    Imaging:  No results found.  Medications: I have reviewed the patient's current medications.  Assessment/Plan: Multiple myeloma, IgG kappa, status post 7 cycles of Revlimid/Decadron and Velcade with marked clinical and laboratory improvement. She is status post melphalan conditioning, followed by an autologous stem cell infusion 09/10/2008. She began maintenance Revlimid 01/04/2009. She has persistent mild elevation of the kappa and lambda light chains. Revlimid placed on hold at time of TTP diagnosis 08/04/2016 SPEP with no M spike observed; stable mild elevation of lambda light chains 09/26/2016 mild elevation of lambda light chains, IgG in normal range Bone survey 09/09/2017- stable findings of multiple myeloma.  No new osseous abnormalities.  Stable multiple thoracic and lumbar spine compression  fractures.  Stable moderate degenerative disc disease at C5-6.  Bone survey 02/09/2019-stable, no new lytic lesions Bone survey 05/10/2020- stable, no new lytic lesions; slight interval increase in endplate depression of T4 superior endplate fracture, nonspecific. Neutropenia while on Revlimid at a dose of 10 mg daily. The Revlimid dose was reduced to 5 mg in September 2011 due to persistent neutropenia and a hospital admission with pneumonia/sepsis.  . Admission 11/03/2009 with left lung pneumonia and sepsis syndrome. No specific organism was cultured during the hospital admission. She completed outpatient Levaquin therapy.   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   Thoracic spine compression fracture, status post a kyphoplasty 12/05/2007. A metastatic bone survey was unchanged on 06/21/2016. Tiny pulmonary embolism on a CT 11/20/2007, now maintained off of Coumadin.   Chronic tachycardia on repeat office visits at the [facility].   History of hypertension. 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 Prednisone started 08/07/2016  Prednisone taper to 40 mg daily beginning 08/14/2016 Plasmapheresis 08/15/2016 Plasmapheresis 08/17/2016 Prednisone taper to 30 mg daily beginning 08/21/2016 Prednisone tapered to 20 mg daily beginning 09/03/2016 Prednisone tapered to 15 mg daily 09/11/2016 Prednisone tapered to 10 mg daily 09/26/2016 Prednisone tapered to 5 mg daily beginning 11/02/2016 Prednisone tapered to 2.5 mg daily for 7 days beginning 11/28/2016, then 2.5 mg every other day for 5 doses, then discontinued   10. Altered mental status-most likely secondary  to TTP, resolved   11.  Proteinuria on 24-hour urine 08/04/2016 (2.3 grams). Seen by Dr. Lorrene Reid during hospitalization June 2018. Renal ultrasound 08/09/2016 with no acute abnormality seen. She is followed by Dr. Lorrene Reid      Disposition: Carrie Huber remains in clinical remission from multiple myeloma.  We will follow-up on the myeloma panel from today.  She receives Zometa every 6 months, infusion today.  She was due for a bone survey in March 2023.  Unfortunately this was not scheduled.  We will try to get it done in the next few weeks.  She will return for lab, follow-up, Zometa in 6 months.  We are available to see her sooner if needed.    Ned Card ANP/GNP-BC   08/16/2021  8:55 AM

## 2021-08-17 LAB — PROTEIN ELECTROPHORESIS, SERUM
A/G Ratio: 1 (ref 0.7–1.7)
Albumin ELP: 3.6 g/dL (ref 2.9–4.4)
Alpha-1-Globulin: 0.2 g/dL (ref 0.0–0.4)
Alpha-2-Globulin: 0.8 g/dL (ref 0.4–1.0)
Beta Globulin: 1.2 g/dL (ref 0.7–1.3)
Gamma Globulin: 1.4 g/dL (ref 0.4–1.8)
Globulin, Total: 3.5 g/dL (ref 2.2–3.9)
Total Protein ELP: 7.1 g/dL (ref 6.0–8.5)

## 2021-08-17 LAB — KAPPA/LAMBDA LIGHT CHAINS
Kappa free light chain: 36.2 mg/L — ABNORMAL HIGH (ref 3.3–19.4)
Kappa, lambda light chain ratio: 1.31 (ref 0.26–1.65)
Lambda free light chains: 27.6 mg/L — ABNORMAL HIGH (ref 5.7–26.3)

## 2021-08-22 ENCOUNTER — Ambulatory Visit (HOSPITAL_COMMUNITY): Admission: RE | Admit: 2021-08-22 | Payer: BC Managed Care – PPO | Source: Ambulatory Visit

## 2021-08-25 ENCOUNTER — Ambulatory Visit (HOSPITAL_COMMUNITY)
Admission: RE | Admit: 2021-08-25 | Discharge: 2021-08-25 | Disposition: A | Discharge status: Home / Self Care | Payer: BC Managed Care – PPO | Source: Ambulatory Visit | Attending: Oncology | Admitting: Oncology

## 2021-08-25 ENCOUNTER — Inpatient Hospital Stay (HOSPITAL_COMMUNITY): Admission: RE | Admit: 2021-08-25 | Payer: BC Managed Care – PPO | Source: Ambulatory Visit

## 2021-08-25 DIAGNOSIS — C9001 Multiple myeloma in remission: Secondary | ICD-10-CM | POA: Insufficient documentation

## 2022-02-14 ENCOUNTER — Inpatient Hospital Stay: Payer: BC Managed Care – PPO | Admitting: Oncology

## 2022-02-14 ENCOUNTER — Inpatient Hospital Stay: Payer: BC Managed Care – PPO | Attending: Oncology

## 2022-02-14 ENCOUNTER — Encounter: Payer: Self-pay | Admitting: *Deleted

## 2022-02-14 ENCOUNTER — Other Ambulatory Visit: Payer: Self-pay | Admitting: *Deleted

## 2022-02-14 ENCOUNTER — Inpatient Hospital Stay: Payer: BC Managed Care – PPO

## 2022-02-14 VITALS — BP 146/81 | HR 74 | Temp 98.1°F | Resp 18 | Ht 67.0 in | Wt 206.4 lb

## 2022-02-14 DIAGNOSIS — C9001 Multiple myeloma in remission: Secondary | ICD-10-CM

## 2022-02-14 DIAGNOSIS — C9000 Multiple myeloma not having achieved remission: Secondary | ICD-10-CM | POA: Diagnosis present

## 2022-02-14 LAB — CBC WITH DIFFERENTIAL (CANCER CENTER ONLY)
Abs Immature Granulocytes: 0.01 10*3/uL (ref 0.00–0.07)
Basophils Absolute: 0 10*3/uL (ref 0.0–0.1)
Basophils Relative: 1 %
Eosinophils Absolute: 0.1 10*3/uL (ref 0.0–0.5)
Eosinophils Relative: 2 %
HCT: 40.6 % (ref 36.0–46.0)
Hemoglobin: 13.3 g/dL (ref 12.0–15.0)
Immature Granulocytes: 0 %
Lymphocytes Relative: 60 %
Lymphs Abs: 2.7 10*3/uL (ref 0.7–4.0)
MCH: 30.4 pg (ref 26.0–34.0)
MCHC: 32.8 g/dL (ref 30.0–36.0)
MCV: 92.9 fL (ref 80.0–100.0)
Monocytes Absolute: 0.5 10*3/uL (ref 0.1–1.0)
Monocytes Relative: 11 %
Neutro Abs: 1.2 10*3/uL — ABNORMAL LOW (ref 1.7–7.7)
Neutrophils Relative %: 26 %
Platelet Count: 163 10*3/uL (ref 150–400)
RBC: 4.37 MIL/uL (ref 3.87–5.11)
RDW: 13.2 % (ref 11.5–15.5)
WBC Count: 4.4 10*3/uL (ref 4.0–10.5)
nRBC: 0 % (ref 0.0–0.2)

## 2022-02-14 LAB — CMP (CANCER CENTER ONLY)
ALT: 11 U/L (ref 0–44)
AST: 19 U/L (ref 15–41)
Albumin: 4.2 g/dL (ref 3.5–5.0)
Alkaline Phosphatase: 54 U/L (ref 38–126)
Anion gap: 8 (ref 5–15)
BUN: 20 mg/dL (ref 8–23)
CO2: 27 mmol/L (ref 22–32)
Calcium: 10 mg/dL (ref 8.9–10.3)
Chloride: 104 mmol/L (ref 98–111)
Creatinine: 1.24 mg/dL — ABNORMAL HIGH (ref 0.44–1.00)
GFR, Estimated: 47 mL/min — ABNORMAL LOW (ref >60)
Glucose, Bld: 96 mg/dL (ref 70–99)
Potassium: 3.7 mmol/L (ref 3.5–5.1)
Sodium: 139 mmol/L (ref 135–145)
Total Bilirubin: 0.9 mg/dL (ref 0.3–1.2)
Total Protein: 7.4 g/dL (ref 6.5–8.1)

## 2022-02-14 MED ORDER — ZOLEDRONIC ACID 4 MG/5ML IV CONC: 4.0000 mg | Freq: Once | INTRAVENOUS | Status: DC

## 2022-02-14 MED ORDER — SODIUM CHLORIDE 0.9 % IV SOLN: INTRAVENOUS | Status: DC

## 2022-02-14 MED ORDER — ZOLEDRONIC ACID 4 MG/100ML IV SOLN
4.0000 mg | Freq: Once | INTRAVENOUS | Status: AC
  Administered 2022-02-14: 4 mg via INTRAVENOUS
  Filled 2022-02-14: qty 100

## 2022-02-14 NOTE — Progress Notes (Signed)
Patient seen by Dr. Benay Spice today  Vitals are within treatment parameters.  Labs reviewed by Dr. Benay Spice and are within treatment parameters. OK to proceed w/creatinine 1.24  Per physician team, patient is ready for treatment and there are NO modifications to the treatment plan.  Due Zometa today.

## 2022-02-14 NOTE — Patient Instructions (Signed)

## 2022-02-14 NOTE — Progress Notes (Signed)
[place] OFFICE PROGRESS NOTE   Diagnosis: Multiple myeloma  INTERVAL HISTORY:   Carrie Huber returns as scheduled.  She feels well.  No pain.  No recent infection.  She continues every 6 months Carrie Huber.  No tooth or jaw pain.  She is followed by a dentist.  Objective:  Vital signs in last 24 hours:  Blood pressure (!) 146/81, pulse 74, temperature 98.1 F (36.7 C), temperature source Oral, resp. rate 18, height 5' 7" (1.702 m), weight 206 lb 6.4 oz (93.6 kg), SpO2 100 %.    Resp: Lungs clear bilaterally Cardio: Regular rate and rhythm GI: No hepatosplenomegaly Vascular: No leg edema, the left lower leg is slightly larger than the right side   Lab Results:  Lab Results  Component Value Date   WBC 4.4 02/14/2022   HGB 13.3 02/14/2022   HCT 40.6 02/14/2022   MCV 92.9 02/14/2022   PLT 163 02/14/2022   NEUTROABS 1.2 (L) 02/14/2022    CMP  Lab Results  Component Value Date   NA 139 02/14/2022   K 3.7 02/14/2022   CL 104 02/14/2022   CO2 27 02/14/2022   GLUCOSE 96 02/14/2022   BUN 20 02/14/2022   CREATININE 1.24 (H) 02/14/2022   CALCIUM 10.0 02/14/2022   PROT 7.4 02/14/2022   ALBUMIN 4.2 02/14/2022   AST 19 02/14/2022   ALT 11 02/14/2022   ALKPHOS 54 02/14/2022   BILITOT 0.9 02/14/2022   GFRNONAA 47 (L) 02/14/2022   GFRAA 60 (L) 11/11/2019     Medications: I have reviewed the patient's current medications.   Assessment/Plan: Multiple myeloma, IgG kappa, status post 7 cycles of Revlimid/Decadron and Velcade with marked clinical and laboratory improvement. She is status post melphalan conditioning, followed by an autologous stem cell infusion 09/10/2008. She began maintenance Revlimid 01/04/2009. She has persistent mild elevation of the kappa and lambda light chains. Revlimid placed on hold at time of TTP diagnosis 08/04/2016 SPEP with no M spike observed; stable mild elevation of lambda light chains 09/26/2016 mild elevation of lambda light  chains, IgG in normal range Bone survey 09/09/2017- stable findings of multiple myeloma.  No new osseous abnormalities.  Stable multiple thoracic and lumbar spine compression fractures.  Stable moderate degenerative disc disease at C5-6.  Bone survey 02/09/2019-stable, no new lytic lesions Bone survey 05/10/2020- stable, no new lytic lesions; slight interval increase in endplate depression of T4 superior endplate fracture, nonspecific. Neutropenia while on Revlimid at a dose of 10 mg daily. The Revlimid dose was reduced to 5 mg in September 2011 due to persistent neutropenia and a hospital admission with pneumonia/sepsis.  . Admission 11/03/2009 with left lung pneumonia and sepsis syndrome. No specific organism was cultured during the hospital admission. She completed outpatient Levaquin therapy.   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   Thoracic spine compression fracture, status post a kyphoplasty 12/05/2007. A metastatic bone survey was unchanged on 06/21/2016. Tiny pulmonary embolism on a CT 11/20/2007, now maintained off of Coumadin.   Chronic tachycardia on repeat office visits at the [facility].   History of hypertension. 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 Prednisone started 08/07/2016  Prednisone taper to 40 mg daily beginning 08/14/2016 Plasmapheresis 08/15/2016 Plasmapheresis 08/17/2016 Prednisone taper to 30 mg daily beginning 08/21/2016  Prednisone tapered to 20 mg daily beginning 09/03/2016 Prednisone tapered to 15 mg daily 09/11/2016 Prednisone tapered to 10 mg daily 09/26/2016 Prednisone tapered to 5 mg daily beginning 11/02/2016 Prednisone tapered to 2.5 mg  daily for 7 days beginning 11/28/2016, then 2.5 mg every other day for 5 doses, then discontinued   10. Altered mental status-most likely secondary to TTP, resolved   11. Proteinuria on 24-hour urine 08/04/2016 (2.3 grams). Seen by Carrie Huber during hospitalization June 2018. Renal ultrasound 08/09/2016 with no acute abnormality seen. She is followed by Carrie Huber     Disposition: Carrie Huber remains in clinical remission for multiple myeloma.  We will follow-up on the myeloma panel from today.  She has chronic mild elevation of the creatinine.  There is chronic mild elevation of the serum free kappa light chains.  No monoclonal protein was detected on a serum protein electrophoresis in June.  She will return for an office visit, Zometa, and a survey in 6 months.  Carrie Coder, MD  02/14/2022  9:52 AM

## 2022-02-15 LAB — KAPPA/LAMBDA LIGHT CHAINS
Kappa free light chain: 32.6 mg/L — ABNORMAL HIGH (ref 3.3–19.4)
Kappa, lambda light chain ratio: 1.27 (ref 0.26–1.65)
Lambda free light chains: 25.7 mg/L (ref 5.7–26.3)

## 2022-02-16 LAB — PROTEIN ELECTROPHORESIS, SERUM
A/G Ratio: 1 (ref 0.7–1.7)
Albumin ELP: 3.5 g/dL (ref 2.9–4.4)
Alpha-1-Globulin: 0.2 g/dL (ref 0.0–0.4)
Alpha-2-Globulin: 0.8 g/dL (ref 0.4–1.0)
Beta Globulin: 1.2 g/dL (ref 0.7–1.3)
Gamma Globulin: 1.3 g/dL (ref 0.4–1.8)
Globulin, Total: 3.5 g/dL (ref 2.2–3.9)
Total Protein ELP: 7 g/dL (ref 6.0–8.5)

## 2022-02-16 LAB — IGG, IGA, IGM
IgA: 474 mg/dL — ABNORMAL HIGH (ref 87–352)
IgG (Immunoglobin G), Serum: 1336 mg/dL (ref 586–1602)
IgM (Immunoglobulin M), Srm: 56 mg/dL (ref 26–217)

## 2022-07-23 ENCOUNTER — Ambulatory Visit (HOSPITAL_BASED_OUTPATIENT_CLINIC_OR_DEPARTMENT_OTHER)
Admission: RE | Admit: 2022-07-23 | Discharge: 2022-07-23 | Disposition: A | Discharge status: Home / Self Care | Payer: BC Managed Care – PPO | Source: Ambulatory Visit | Attending: Oncology | Admitting: Oncology

## 2022-07-23 DIAGNOSIS — C9001 Multiple myeloma in remission: Secondary | ICD-10-CM

## 2022-08-16 ENCOUNTER — Encounter: Payer: Self-pay | Admitting: Nurse Practitioner

## 2022-08-16 ENCOUNTER — Inpatient Hospital Stay: Payer: BC Managed Care – PPO | Attending: Nurse Practitioner

## 2022-08-16 ENCOUNTER — Inpatient Hospital Stay: Payer: BC Managed Care – PPO

## 2022-08-16 ENCOUNTER — Inpatient Hospital Stay: Payer: BC Managed Care – PPO | Admitting: Nurse Practitioner

## 2022-08-16 VITALS — BP 140/71 | HR 76 | Temp 98.1°F | Resp 18 | Ht 67.0 in | Wt 209.9 lb

## 2022-08-16 DIAGNOSIS — C9001 Multiple myeloma in remission: Secondary | ICD-10-CM

## 2022-08-16 LAB — CBC WITH DIFFERENTIAL (CANCER CENTER ONLY)
Abs Immature Granulocytes: 0.01 10*3/uL (ref 0.00–0.07)
Basophils Absolute: 0 10*3/uL (ref 0.0–0.1)
Basophils Relative: 1 %
Eosinophils Absolute: 0.1 10*3/uL (ref 0.0–0.5)
Eosinophils Relative: 2 %
HCT: 39.7 % (ref 36.0–46.0)
Hemoglobin: 13 g/dL (ref 12.0–15.0)
Immature Granulocytes: 0 %
Lymphocytes Relative: 51 %
Lymphs Abs: 2 10*3/uL (ref 0.7–4.0)
MCH: 29.5 pg (ref 26.0–34.0)
MCHC: 32.7 g/dL (ref 30.0–36.0)
MCV: 90.2 fL (ref 80.0–100.0)
Monocytes Absolute: 0.4 10*3/uL (ref 0.1–1.0)
Monocytes Relative: 11 %
Neutro Abs: 1.3 10*3/uL — ABNORMAL LOW (ref 1.7–7.7)
Neutrophils Relative %: 35 %
Platelet Count: 191 10*3/uL (ref 150–400)
RBC: 4.4 MIL/uL (ref 3.87–5.11)
RDW: 13.4 % (ref 11.5–15.5)
WBC Count: 3.9 10*3/uL — ABNORMAL LOW (ref 4.0–10.5)
nRBC: 0 % (ref 0.0–0.2)

## 2022-08-16 LAB — CMP (CANCER CENTER ONLY)
ALT: 12 U/L (ref 0–44)
AST: 19 U/L (ref 15–41)
Albumin: 4.2 g/dL (ref 3.5–5.0)
Alkaline Phosphatase: 55 U/L (ref 38–126)
Anion gap: 8 (ref 5–15)
BUN: 19 mg/dL (ref 8–23)
CO2: 24 mmol/L (ref 22–32)
Calcium: 9.7 mg/dL (ref 8.9–10.3)
Chloride: 109 mmol/L (ref 98–111)
Creatinine: 1.13 mg/dL — ABNORMAL HIGH (ref 0.44–1.00)
GFR, Estimated: 52 mL/min — ABNORMAL LOW (ref >60)
Glucose, Bld: 98 mg/dL (ref 70–99)
Potassium: 3.9 mmol/L (ref 3.5–5.1)
Sodium: 141 mmol/L (ref 135–145)
Total Bilirubin: 0.6 mg/dL (ref 0.3–1.2)
Total Protein: 7.3 g/dL (ref 6.5–8.1)

## 2022-08-16 MED ORDER — SODIUM CHLORIDE 0.9 % IV SOLN: INTRAVENOUS | Status: DC

## 2022-08-16 MED ORDER — ZOLEDRONIC ACID 4 MG/5ML IV CONC
4.0000 mg | Freq: Once | INTRAVENOUS | Status: AC
  Administered 2022-08-16: 4 mg via INTRAVENOUS
  Filled 2022-08-16: qty 5

## 2022-08-16 NOTE — Patient Instructions (Signed)

## 2022-08-16 NOTE — Progress Notes (Signed)
[facility] OFFICE PROGRESS NOTE   Diagnosis: Multiple myeloma  INTERVAL HISTORY:   [name] returns as scheduled.  She continues every 6 months Zometa.  No interim illnesses or actions.  No pain.  No tooth/jaw/gum issues.  Objective:  Vital signs in last 24 hours:  Blood pressure (!) 140/71, pulse 76, temperature 98.1 F (36.7 C), temperature source Oral, resp. rate 18, height 5\' 7"  (1.702 m), weight 209 lb 14.4 oz (95.2 kg), SpO2 100 %.    HEENT: No thrush or ulcers. Lymphatics: No palpable cervical, supraclavicular or axillary lymph nodes. Resp: Lungs clear bilaterally. Cardio: Regular rate and rhythm. GI: Abdomen soft and nontender.  No hepatosplenomegaly. Vascular: Trace bilateral pretibial/ankle edema.  Left lower leg is chronically larger than the right lower leg. Neuro: Alert and oriented. Skin: No rash.   Lab Results:  Lab Results  Component Value Date   WBC 3.9 (L) 08/16/2022   HGB 13.0 08/16/2022   HCT 39.7 08/16/2022   MCV 90.2 08/16/2022   PLT 191 08/16/2022   NEUTROABS 1.3 (L) 08/16/2022    Imaging:  No results found.  Medications: I have reviewed the patient's current medications.  Assessment/Plan: Multiple myeloma, IgG kappa, status post 7 cycles of Revlimid/Decadron and Velcade with marked clinical and laboratory improvement. She is status post melphalan conditioning, followed by an autologous stem cell infusion 09/10/2008. She began maintenance Revlimid 01/04/2009. She has persistent mild elevation of the kappa and lambda light chains. Revlimid placed on hold at time of TTP diagnosis 08/04/2016 SPEP with no M spike observed; stable mild elevation of lambda light chains 09/26/2016 mild elevation of lambda light chains, IgG in normal range Bone survey 09/09/2017- stable findings of multiple myeloma.  No new osseous abnormalities.  Stable multiple thoracic and lumbar spine compression fractures.  Stable moderate degenerative disc  disease at C5-6.  Bone survey 02/09/2019-stable, no new lytic lesions Bone survey 05/10/2020- stable, no new lytic lesions; slight interval increase in endplate depression of T4 superior endplate fracture, nonspecific. Bone survey 08/25/2021-stable lytic lesion anterior calvarium, multiple stable compression deformities in the thoracolumbar spine.  No new lesions are identified. Bone survey 07/23/2022-stable frontal calvarial lytic lesion.  No new lesions. Neutropenia while on Revlimid at a dose of 10 mg daily. The Revlimid dose was reduced to 5 mg in September 2011 due to persistent neutropenia and a hospital admission with pneumonia/sepsis.  . Admission 11/03/2009 with left lung pneumonia and sepsis syndrome. No specific organism was cultured during the hospital admission. She completed outpatient Levaquin therapy.   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   Thoracic spine compression fracture, status post a kyphoplasty 12/05/2007. A metastatic bone survey was unchanged on 06/21/2016. Tiny pulmonary embolism on a CT 11/20/2007, now maintained off of Coumadin.   Chronic tachycardia on repeat office visits at the [facility].   History of hypertension. 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 Prednisone started 08/07/2016  Prednisone taper to 40 mg daily beginning 08/14/2016 Plasmapheresis 08/15/2016 Plasmapheresis 08/17/2016 Prednisone taper to 30 mg daily beginning 08/21/2016 Prednisone tapered to 20 mg daily beginning 09/03/2016 Prednisone tapered to 15 mg daily 09/11/2016 Prednisone tapered to 10 mg daily 09/26/2016 Prednisone tapered to 5 mg daily beginning 11/02/2016 Prednisone  tapered to 2.5 mg daily for 7 days  beginning 11/28/2016, then 2.5 mg every other day for 5 doses, then discontinued   10. Altered mental status-most likely secondary to TTP, resolved   11. Proteinuria on 24-hour urine 08/04/2016 (2.3 grams). Seen by Dr. Eliott Nine during hospitalization June 2018. Renal ultrasound 08/09/2016 with no acute abnormality seen. She is followed by Dr. Eliott Nine  Disposition: Carrie Huber remains in clinical remission from multiple myeloma.  We will follow-up on the myeloma panel from today.  Recent bone survey was stable with no new lesions.  She is scheduled to receive Zometa today.  She will return for follow-up and Zometa in 6 months.  We are available to see her sooner if needed.    Lonna Cobb ANP/GNP-BC   08/16/2022  10:09 AM

## 2022-08-17 LAB — KAPPA/LAMBDA LIGHT CHAINS
Kappa free light chain: 24.8 mg/L — ABNORMAL HIGH (ref 3.3–19.4)
Kappa, lambda light chain ratio: 0.86 (ref 0.26–1.65)
Lambda free light chains: 28.9 mg/L — ABNORMAL HIGH (ref 5.7–26.3)

## 2022-08-21 LAB — MULTIPLE MYELOMA PANEL, SERUM
Albumin SerPl Elph-Mcnc: 3.6 g/dL (ref 2.9–4.4)
Albumin/Glob SerPl: 1.1 (ref 0.7–1.7)
Alpha 1: 0.2 g/dL (ref 0.0–0.4)
Alpha2 Glob SerPl Elph-Mcnc: 0.8 g/dL (ref 0.4–1.0)
B-Globulin SerPl Elph-Mcnc: 1.2 g/dL (ref 0.7–1.3)
Gamma Glob SerPl Elph-Mcnc: 1.2 g/dL (ref 0.4–1.8)
Globulin, Total: 3.4 g/dL (ref 2.2–3.9)
IgA: 445 mg/dL — ABNORMAL HIGH (ref 87–352)
IgG (Immunoglobin G), Serum: 1295 mg/dL (ref 586–1602)
IgM (Immunoglobulin M), Srm: 67 mg/dL (ref 26–217)
Total Protein ELP: 7 g/dL (ref 6.0–8.5)

## 2023-02-15 ENCOUNTER — Inpatient Hospital Stay: Payer: BC Managed Care – PPO

## 2023-02-15 ENCOUNTER — Inpatient Hospital Stay: Payer: BC Managed Care – PPO | Attending: Oncology | Admitting: Oncology

## 2023-02-15 VITALS — BP 135/78 | HR 62 | Resp 18

## 2023-02-15 VITALS — BP 142/81 | HR 80 | Temp 98.2°F | Resp 18 | Ht 67.0 in | Wt 211.8 lb

## 2023-02-15 DIAGNOSIS — C9001 Multiple myeloma in remission: Secondary | ICD-10-CM | POA: Diagnosis not present

## 2023-02-15 DIAGNOSIS — Z23 Encounter for immunization: Secondary | ICD-10-CM | POA: Insufficient documentation

## 2023-02-15 LAB — CBC WITH DIFFERENTIAL (CANCER CENTER ONLY)
Abs Immature Granulocytes: 0.01 10*3/uL (ref 0.00–0.07)
Basophils Absolute: 0 10*3/uL (ref 0.0–0.1)
Basophils Relative: 1 %
Eosinophils Absolute: 0.1 10*3/uL (ref 0.0–0.5)
Eosinophils Relative: 1 %
HCT: 38.5 % (ref 36.0–46.0)
Hemoglobin: 12.6 g/dL (ref 12.0–15.0)
Immature Granulocytes: 0 %
Lymphocytes Relative: 50 %
Lymphs Abs: 2.1 10*3/uL (ref 0.7–4.0)
MCH: 29.8 pg (ref 26.0–34.0)
MCHC: 32.7 g/dL (ref 30.0–36.0)
MCV: 91 fL (ref 80.0–100.0)
Monocytes Absolute: 0.4 10*3/uL (ref 0.1–1.0)
Monocytes Relative: 10 %
Neutro Abs: 1.6 10*3/uL — ABNORMAL LOW (ref 1.7–7.7)
Neutrophils Relative %: 38 %
Platelet Count: 157 10*3/uL (ref 150–400)
RBC: 4.23 MIL/uL (ref 3.87–5.11)
RDW: 13.4 % (ref 11.5–15.5)
WBC Count: 4.2 10*3/uL (ref 4.0–10.5)
nRBC: 0 % (ref 0.0–0.2)

## 2023-02-15 LAB — CMP (CANCER CENTER ONLY)
ALT: 14 U/L (ref 0–44)
AST: 18 U/L (ref 15–41)
Albumin: 4 g/dL (ref 3.5–5.0)
Alkaline Phosphatase: 51 U/L (ref 38–126)
Anion gap: 8 (ref 5–15)
BUN: 13 mg/dL (ref 8–23)
CO2: 25 mmol/L (ref 22–32)
Calcium: 9.6 mg/dL (ref 8.9–10.3)
Chloride: 107 mmol/L (ref 98–111)
Creatinine: 1.04 mg/dL — ABNORMAL HIGH (ref 0.44–1.00)
GFR, Estimated: 58 mL/min — ABNORMAL LOW (ref >60)
Glucose, Bld: 97 mg/dL (ref 70–99)
Potassium: 3.7 mmol/L (ref 3.5–5.1)
Sodium: 140 mmol/L (ref 135–145)
Total Bilirubin: 0.8 mg/dL (ref <1.2)
Total Protein: 7 g/dL (ref 6.5–8.1)

## 2023-02-15 MED ORDER — SODIUM CHLORIDE 0.9 % IV SOLN
INTRAVENOUS | Status: DC
Start: 2023-02-15 — End: 2023-02-15

## 2023-02-15 MED ORDER — INFLUENZA VAC A&B SURF ANT ADJ 0.5 ML IM SUSY
0.5000 mL | PREFILLED_SYRINGE | Freq: Once | INTRAMUSCULAR | Status: AC
  Administered 2023-02-15: 0.5 mL via INTRAMUSCULAR
  Filled 2023-02-15: qty 0.5

## 2023-02-15 MED ORDER — ZOLEDRONIC ACID 4 MG/5ML IV CONC: 4.0000 mg | Freq: Once | INTRAVENOUS | Status: DC

## 2023-02-15 MED ORDER — ZOLEDRONIC ACID 4 MG/100ML IV SOLN
4.0000 mg | Freq: Once | INTRAVENOUS | Status: AC
  Administered 2023-02-15: 4 mg via INTRAVENOUS
  Filled 2023-02-15: qty 100

## 2023-02-15 NOTE — Progress Notes (Signed)
Patient seen by Dr. Thornton Papas today  Vitals are within treatment parameters:Yes   Labs are within treatment parameters: Yes   Treatment plan has been signed: Yes   Per physician team, Patient is ready for treatment. Please note the following modifications: Would also like flu vaccine today.

## 2023-02-15 NOTE — Progress Notes (Signed)
Car[facility] OFFICE PROGRESS NOTE   Diagnosis: Multiple myeloma  INTERVAL HISTORY:   Carrie Huber returns as scheduled.  She feels well.  Good appetite.  She is exercising.  She has difficulty losing.  No pain.  No tooth or jaw pain.  Objective:  Vital signs in last 24 hours:  Blood pressure (!) 142/81, pulse 80, temperature 98.2 F (36.8 C), temperature source Temporal, resp. rate 18, height 5\' 7"  (1.702 m), weight 211 lb 12.8 oz (96.1 kg), SpO2 99%.    Resp: End inspiratory rhonchi at the left posterior base, no respiratory distress Cardio: Regular rate and rhythm GI: No hepatosplenomegaly Vascular: The left lower leg is larger than the right side, no edema   Lab Results:  Lab Results  Component Value Date   WBC 4.2 02/15/2023   HGB 12.6 02/15/2023   HCT 38.5 02/15/2023   MCV 91.0 02/15/2023   PLT 157 02/15/2023   NEUTROABS 1.6 (L) 02/15/2023    CMP  Lab Results  Component Value Date   NA 141 08/16/2022   K 3.9 08/16/2022   CL 109 08/16/2022   CO2 24 08/16/2022   GLUCOSE 98 08/16/2022   BUN 19 08/16/2022   CREATININE 1.13 (H) 08/16/2022   CALCIUM 9.7 08/16/2022   PROT 7.3 08/16/2022   ALBUMIN 4.2 08/16/2022   AST 19 08/16/2022   ALT 12 08/16/2022   ALKPHOS 55 08/16/2022   BILITOT 0.6 08/16/2022   GFRNONAA 52 (L) 08/16/2022   GFRAA 60 (L) 11/11/2019    No results found for: "CEA1", "CEA", "CAN199", "CA125"  Lab Results  Component Value Date   INR 1.12 08/04/2016   LABPROT 14.5 08/04/2016    Imaging:  No results found.  Medications: I have reviewed the patient's current medications.   Assessment/Plan: Multiple myeloma, IgG kappa, status post 7 cycles of Revlimid/Decadron and Velcade with marked clinical and laboratory improvement. She is status post melphalan conditioning, followed by an autologous stem cell infusion 09/10/2008. She began maintenance Revlimid 01/04/2009. She has persistent mild elevation of the kappa and lambda  light chains. Revlimid placed on hold at time of TTP diagnosis 08/04/2016 SPEP with no M spike observed; stable mild elevation of lambda light chains 09/26/2016 mild elevation of lambda light chains, IgG in normal range Bone survey 09/09/2017- stable findings of multiple myeloma.  No new osseous abnormalities.  Stable multiple thoracic and lumbar spine compression fractures.  Stable moderate degenerative disc disease at C5-6.  Bone survey 02/09/2019-stable, no new lytic lesions Bone survey 05/10/2020- stable, no new lytic lesions; slight interval increase in endplate depression of T4 superior endplate fracture, nonspecific. Bone survey 08/25/2021-stable lytic lesion anterior calvarium, multiple stable compression deformities in the thoracolumbar spine.  No new lesions are identified. Bone survey 07/23/2022-stable frontal calvarial lytic lesion.  No new lesions. Neutropenia while on Revlimid at a dose of 10 mg daily. The Revlimid dose was reduced to 5 mg in September 2011 due to persistent neutropenia and a [facility] admission with pneumonia/sepsis.  . Admission 11/03/2009 with left lung pneumonia and sepsis syndrome. No specific organism was cultured during the Huber admission. She completed outpatient Levaquin therapy.   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   Thoracic spine compression fracture, status post a kyphoplasty 12/05/2007. A metastatic bone survey was unchanged on 06/21/2016. Tiny pulmonary embolism on a CT 11/20/2007, now maintained off of Coumadin.   Chronic tachycardia on repeat office visits at the Carrie Hub[facility].  History of hypertension. Admission 08/03/2016 with severe anemia/thrombocytopenia-clinical presentation and laboratory studies consistent with a diagnosis of TTP; Adamts 13 activity less than 2% Daily plasmapheresis initiated 08/04/2016, daily treatment #6 08/09/2016; every other day plasmapheresis beginning  08/11/2016. Prednisone started 08/07/2016  Prednisone taper to 40 mg daily beginning 08/14/2016 Plasmapheresis 08/15/2016 Plasmapheresis 08/17/2016 Prednisone taper to 30 mg daily beginning 08/21/2016 Prednisone tapered to 20 mg daily beginning 09/03/2016 Prednisone tapered to 15 mg daily 09/11/2016 Prednisone tapered to 10 mg daily 09/26/2016 Prednisone tapered to 5 mg daily beginning 11/02/2016 Prednisone tapered to 2.5 mg daily for 7 days beginning 11/28/2016, then 2.5 mg every other day for 5 doses, then discontinued   10. Altered mental status-most likely secondary to TTP, resolved   11. Proteinuria on 24-hour urine 08/04/2016 (2.3 grams). Seen by Dr. Eliott Huber during hospitalization June 2018. Renal ultrasound 08/09/2016 with no acute abnormality seen. She is followed by Dr. Eliott Huber   Disposition: Carrie Huber is in clinical remission from myeloma.  She continues every 40-month Zometa.  She appears to be tolerating Zometa well.  She will be sure she is up-to-date on the pneumococcal 20 vaccine.  She received an influenza vaccine today.  She will return for an office visit and Zometa in 6 months.  We will follow-up on the myeloma panel from today.  She will be scheduled for a bone survey when she returns in 6 months.  Thornton Papas, MD  02/15/2023  10:17 AM

## 2023-02-15 NOTE — Patient Instructions (Signed)
Zoledronic Acid Injection (Cancer) What is this medication? ZOLEDRONIC ACID (ZOE le dron ik AS id) treats high calcium levels in the blood caused by cancer. It may also be used with chemotherapy to treat weakened bones caused by cancer. It works by slowing down the release of calcium from bones. This lowers calcium levels in your blood. It also makes your bones stronger and less likely to break (fracture). It belongs to a group of medications called bisphosphonates. This medicine may be used for other purposes; ask your health care provider or pharmacist if you have questions. COMMON BRAND NAME(S): Zometa, Zometa Powder What should I tell my care team before I take this medication? They need to know if you have any of these conditions: Dehydration Dental disease Kidney disease Liver disease Low levels of calcium in the blood Lung or breathing disease, such as asthma Receiving steroids, such as dexamethasone or prednisone An unusual or allergic reaction to zoledronic acid, other medications, foods, dyes, or preservatives Pregnant or trying to get pregnant Breast-feeding How should I use this medication? This medication is injected into a vein. It is given by your care team in a hospital or clinic setting. Talk to your care team about the use of this medication in children. Special care may be needed. Overdosage: If you think you have taken too much of this medicine contact a poison control center or emergency room at once. NOTE: This medicine is only for you. Do not share this medicine with others. What if I miss a dose? Keep appointments for follow-up doses. It is important not to miss your dose. Call your care team if you are unable to keep an appointment. What may interact with this medication? Certain antibiotics given by injection Diuretics, such as bumetanide, furosemide NSAIDs, medications for pain and inflammation, such as ibuprofen or naproxen Teriparatide Thalidomide This list  may not describe all possible interactions. Give your health care provider a list of all the medicines, herbs, non-prescription drugs, or dietary supplements you use. Also tell them if you smoke, drink alcohol, or use illegal drugs. Some items may interact with your medicine. What should I watch for while using this medication? Visit your care team for regular checks on your progress. It may be some time before you see the benefit from this medication. Some people who take this medication have severe bone, joint, or muscle pain. This medication may also increase your risk for jaw problems or a broken thigh bone. Tell your care team right away if you have severe pain in your jaw, bones, joints, or muscles. Tell you care team if you have any pain that does not go away or that gets worse. Tell your dentist and dental surgeon that you are taking this medication. You should not have major dental surgery while on this medication. See your dentist to have a dental exam and fix any dental problems before starting this medication. Take good care of your teeth while on this medication. Make sure you see your dentist for regular follow-up appointments. You should make sure you get enough calcium and vitamin D while you are taking this medication. Discuss the foods you eat and the vitamins you take with your care team. Check with your care team if you have severe diarrhea, nausea, and vomiting, or if you sweat a lot. The loss of too much body fluid may make it dangerous for you to take this medication. You may need bloodwork while taking this medication. Talk to your care team if  you wish to become pregnant or think you might be pregnant. This medication can cause serious birth defects. What side effects may I notice from receiving this medication? Side effects that you should report to your care team as soon as possible: Allergic reactions--skin rash, itching, hives, swelling of the face, lips, tongue, or  throat Kidney injury--decrease in the amount of urine, swelling of the ankles, hands, or feet Low calcium level--muscle pain or cramps, confusion, tingling, or numbness in the hands or feet Osteonecrosis of the jaw--pain, swelling, or redness in the mouth, numbness of the jaw, poor healing after dental work, unusual discharge from the mouth, visible bones in the mouth Severe bone, joint, or muscle pain Side effects that usually do not require medical attention (report to your care team if they continue or are bothersome): Constipation Fatigue Fever Loss of appetite Nausea Stomach pain This list may not describe all possible side effects. Call your doctor for medical advice about side effects. You may report side effects to FDA at 1-800-FDA-1088. Where should I keep my medication? This medication is given in a hospital or clinic. It will not be stored at home. NOTE: This sheet is a summary. It may not cover all possible information. If you have questions about this medicine, talk to your doctor, pharmacist, or health care provider.  2024 Elsevier/Gold Standard (2021-04-14 00:00:00)   Influenza Vaccine Injection What is this medication? INFLUENZA VACCINE (in floo EN zuh vak SEEN) reduces the risk of the influenza (flu). It does not treat influenza. It is still possible to get influenza after receiving this vaccine, but the symptoms may be less severe or not last as long. It works by helping your immune system learn how to fight off a future infection. This medicine may be used for other purposes; ask your health care provider or pharmacist if you have questions. COMMON BRAND NAME(S): Afluria Quadrivalent, FLUAD Quadrivalent, Fluarix Quadrivalent, Flublok Quadrivalent, FLUCELVAX Quadrivalent, Flulaval Quadrivalent, Fluzone Quadrivalent What should I tell my care team before I take this medication? They need to know if you have any of these conditions: Bleeding disorder like hemophilia Fever  or infection Guillain-Barre syndrome or other neurological problems Immune system problems Infection with the human immunodeficiency virus (HIV) or AIDS Low blood platelet counts Multiple sclerosis An unusual or allergic reaction to influenza virus vaccine, latex, other medications, foods, dyes, or preservatives. Different brands of vaccines contain different allergens. Some may contain latex or eggs. Talk to your care team about your allergies to make sure that you get the right vaccine. Pregnant or trying to get pregnant Breastfeeding How should I use this medication? This vaccine is injected into a muscle or under the skin. It is given by your care team. A copy of Vaccine Information Statements will be given before each vaccination. Be sure to read this sheet carefully each time. This sheet may change often. Talk to your care team to see which vaccines are right for you. Some vaccines should not be used in all age groups. Overdosage: If you think you have taken too much of this medicine contact a poison control center or emergency room at once. NOTE: This medicine is only for you. Do not share this medicine with others. What if I miss a dose? This does not apply. What may interact with this medication? Certain medications that lower your immune system, such as etanercept, anakinra, infliximab, adalimumab Certain medications that prevent or treat blood clots, such as warfarin Chemotherapy or radiation therapy Phenytoin Steroid medications,  such as prednisone or cortisone Theophylline Vaccines This list may not describe all possible interactions. Give your health care provider a list of all the medicines, herbs, non-prescription drugs, or dietary supplements you use. Also tell them if you smoke, drink alcohol, or use illegal drugs. Some items may interact with your medicine. What should I watch for while using this medication? Report any side effects that do not go away with your care  team. Call your care team if any unusual symptoms occur within 6 weeks of receiving this vaccine. You may still catch the flu, but the illness is not usually as bad. You cannot get the flu from the vaccine. The vaccine will not protect against colds or other illnesses that may cause fever. The vaccine is needed every year. What side effects may I notice from receiving this medication? Side effects that you should report to your care team as soon as possible: Allergic reactions--skin rash, itching, hives, swelling of the face, lips, tongue, or throat Side effects that usually do not require medical attention (report these to your care team if they continue or are bothersome): Chills Fatigue Headache Joint pain Loss of appetite Muscle pain Nausea Pain, redness, or irritation at injection site This list may not describe all possible side effects. Call your doctor for medical advice about side effects. You may report side effects to FDA at 1-800-FDA-1088. Where should I keep my medication? The vaccine is only given by your care team. It will not be stored at home. NOTE: This sheet is a summary. It may not cover all possible information. If you have questions about this medicine, talk to your doctor, pharmacist, or health care provider.  2024 Elsevier/Gold Standard (2021-08-01 00:00:00)

## 2023-02-18 LAB — KAPPA/LAMBDA LIGHT CHAINS
Kappa free light chain: 25.2 mg/L — ABNORMAL HIGH (ref 3.3–19.4)
Kappa, lambda light chain ratio: 0.74 (ref 0.26–1.65)
Lambda free light chains: 34.1 mg/L — ABNORMAL HIGH (ref 5.7–26.3)

## 2023-02-22 LAB — MULTIPLE MYELOMA PANEL, SERUM
Albumin SerPl Elph-Mcnc: 3.6 g/dL (ref 2.9–4.4)
Albumin/Glob SerPl: 1.2 (ref 0.7–1.7)
Alpha 1: 0.2 g/dL (ref 0.0–0.4)
Alpha2 Glob SerPl Elph-Mcnc: 0.8 g/dL (ref 0.4–1.0)
B-Globulin SerPl Elph-Mcnc: 1.2 g/dL (ref 0.7–1.3)
Gamma Glob SerPl Elph-Mcnc: 1 g/dL (ref 0.4–1.8)
Globulin, Total: 3.2 g/dL (ref 2.2–3.9)
IgA: 414 mg/dL — ABNORMAL HIGH (ref 87–352)
IgG (Immunoglobin G), Serum: 1123 mg/dL (ref 586–1602)
IgM (Immunoglobulin M), Srm: 51 mg/dL (ref 26–217)
Total Protein ELP: 6.8 g/dL (ref 6.0–8.5)

## 2023-03-04 ENCOUNTER — Encounter: Payer: Self-pay | Admitting: Oncology

## 2023-06-13 ENCOUNTER — Other Ambulatory Visit: Payer: Self-pay | Admitting: Physician Assistant

## 2023-08-14 ENCOUNTER — Other Ambulatory Visit: Payer: Self-pay | Admitting: Nurse Practitioner

## 2023-08-16 ENCOUNTER — Inpatient Hospital Stay: Payer: BC Managed Care – PPO

## 2023-08-16 ENCOUNTER — Ambulatory Visit: Payer: BC Managed Care – PPO

## 2023-08-16 ENCOUNTER — Inpatient Hospital Stay: Payer: BC Managed Care – PPO | Attending: Oncology

## 2023-08-16 ENCOUNTER — Encounter: Payer: Self-pay | Admitting: Oncology

## 2023-08-16 ENCOUNTER — Inpatient Hospital Stay (HOSPITAL_BASED_OUTPATIENT_CLINIC_OR_DEPARTMENT_OTHER): Payer: BC Managed Care – PPO | Admitting: Oncology

## 2023-08-16 VITALS — BP 140/77 | HR 72 | Temp 98.1°F | Resp 18 | Ht 67.0 in | Wt 212.4 lb

## 2023-08-16 DIAGNOSIS — C9001 Multiple myeloma in remission: Secondary | ICD-10-CM

## 2023-08-16 DIAGNOSIS — C9000 Multiple myeloma not having achieved remission: Secondary | ICD-10-CM | POA: Diagnosis present

## 2023-08-16 LAB — CMP (CANCER CENTER ONLY)
ALT: 17 U/L (ref 0–44)
AST: 25 U/L (ref 15–41)
Albumin: 4.3 g/dL (ref 3.5–5.0)
Alkaline Phosphatase: 70 U/L (ref 38–126)
Anion gap: 15 (ref 5–15)
BUN: 17 mg/dL (ref 8–23)
CO2: 20 mmol/L — ABNORMAL LOW (ref 22–32)
Calcium: 10.4 mg/dL — ABNORMAL HIGH (ref 8.9–10.3)
Chloride: 106 mmol/L (ref 98–111)
Creatinine: 1.06 mg/dL — ABNORMAL HIGH (ref 0.44–1.00)
GFR, Estimated: 56 mL/min — ABNORMAL LOW (ref >60)
Glucose, Bld: 102 mg/dL — ABNORMAL HIGH (ref 70–99)
Potassium: 4.1 mmol/L (ref 3.5–5.1)
Sodium: 141 mmol/L (ref 135–145)
Total Bilirubin: 0.7 mg/dL (ref 0.0–1.2)
Total Protein: 7.8 g/dL (ref 6.5–8.1)

## 2023-08-16 LAB — CBC WITH DIFFERENTIAL (CANCER CENTER ONLY)
Abs Immature Granulocytes: 0.01 10*3/uL (ref 0.00–0.07)
Basophils Absolute: 0 10*3/uL (ref 0.0–0.1)
Basophils Relative: 1 %
Eosinophils Absolute: 0.1 10*3/uL (ref 0.0–0.5)
Eosinophils Relative: 1 %
HCT: 40.4 % (ref 36.0–46.0)
Hemoglobin: 13.5 g/dL (ref 12.0–15.0)
Immature Granulocytes: 0 %
Lymphocytes Relative: 49 %
Lymphs Abs: 2.1 10*3/uL (ref 0.7–4.0)
MCH: 30 pg (ref 26.0–34.0)
MCHC: 33.4 g/dL (ref 30.0–36.0)
MCV: 89.8 fL (ref 80.0–100.0)
Monocytes Absolute: 0.4 10*3/uL (ref 0.1–1.0)
Monocytes Relative: 10 %
Neutro Abs: 1.7 10*3/uL (ref 1.7–7.7)
Neutrophils Relative %: 39 %
Platelet Count: 184 10*3/uL (ref 150–400)
RBC: 4.5 MIL/uL (ref 3.87–5.11)
RDW: 13.6 % (ref 11.5–15.5)
WBC Count: 4.3 10*3/uL (ref 4.0–10.5)
nRBC: 0 % (ref 0.0–0.2)

## 2023-08-16 MED ORDER — SODIUM CHLORIDE 0.9 % IV SOLN: INTRAVENOUS | Status: DC

## 2023-08-16 MED ORDER — ZOLEDRONIC ACID 4 MG/100ML IV SOLN
4.0000 mg | Freq: Once | INTRAVENOUS | Status: AC
  Administered 2023-08-16: 4 mg via INTRAVENOUS
  Filled 2023-08-16: qty 100

## 2023-08-16 NOTE — Patient Instructions (Signed)
 Zoledronic Acid Injection (Cancer) What is this medication? ZOLEDRONIC ACID (ZOE le dron ik AS id) treats high calcium levels in the blood caused by cancer. It may also be used with chemotherapy to treat weakened bones caused by cancer. It works by slowing down the release of calcium from bones. This lowers calcium levels in your blood. It also makes your bones stronger and less likely to break (fracture). It belongs to a group of medications called bisphosphonates. This medicine may be used for other purposes; ask your health care provider or pharmacist if you have questions. COMMON BRAND NAME(S): Zometa, Zometa Powder What should I tell my care team before I take this medication? They need to know if you have any of these conditions: Dehydration Dental disease Kidney disease Liver disease Low levels of calcium in the blood Lung or breathing disease, such as asthma Receiving steroids, such as dexamethasone or prednisone An unusual or allergic reaction to zoledronic acid, other medications, foods, dyes, or preservatives Pregnant or trying to get pregnant Breast-feeding How should I use this medication? This medication is injected into a vein. It is given by your care team in a hospital or clinic setting. Talk to your care team about the use of this medication in children. Special care may be needed. Overdosage: If you think you have taken too much of this medicine contact a poison control center or emergency room at once. NOTE: This medicine is only for you. Do not share this medicine with others. What if I miss a dose? Keep appointments for follow-up doses. It is important not to miss your dose. Call your care team if you are unable to keep an appointment. What may interact with this medication? Certain antibiotics given by injection Diuretics, such as bumetanide, furosemide NSAIDs, medications for pain and inflammation, such as ibuprofen or naproxen Teriparatide Thalidomide This list  may not describe all possible interactions. Give your health care provider a list of all the medicines, herbs, non-prescription drugs, or dietary supplements you use. Also tell them if you smoke, drink alcohol, or use illegal drugs. Some items may interact with your medicine. What should I watch for while using this medication? Visit your care team for regular checks on your progress. It may be some time before you see the benefit from this medication. Some people who take this medication have severe bone, joint, or muscle pain. This medication may also increase your risk for jaw problems or a broken thigh bone. Tell your care team right away if you have severe pain in your jaw, bones, joints, or muscles. Tell you care team if you have any pain that does not go away or that gets worse. Tell your dentist and dental surgeon that you are taking this medication. You should not have major dental surgery while on this medication. See your dentist to have a dental exam and fix any dental problems before starting this medication. Take good care of your teeth while on this medication. Make sure you see your dentist for regular follow-up appointments. You should make sure you get enough calcium and vitamin D while you are taking this medication. Discuss the foods you eat and the vitamins you take with your care team. Check with your care team if you have severe diarrhea, nausea, and vomiting, or if you sweat a lot. The loss of too much body fluid may make it dangerous for you to take this medication. You may need bloodwork while taking this medication. Talk to your care team if  you wish to become pregnant or think you might be pregnant. This medication can cause serious birth defects. What side effects may I notice from receiving this medication? Side effects that you should report to your care team as soon as possible: Allergic reactions--skin rash, itching, hives, swelling of the face, lips, tongue, or  throat Kidney injury--decrease in the amount of urine, swelling of the ankles, hands, or feet Low calcium level--muscle pain or cramps, confusion, tingling, or numbness in the hands or feet Osteonecrosis of the jaw--pain, swelling, or redness in the mouth, numbness of the jaw, poor healing after dental work, unusual discharge from the mouth, visible bones in the mouth Severe bone, joint, or muscle pain Side effects that usually do not require medical attention (report to your care team if they continue or are bothersome): Constipation Fatigue Fever Loss of appetite Nausea Stomach pain This list may not describe all possible side effects. Call your doctor for medical advice about side effects. You may report side effects to FDA at 1-800-FDA-1088. Where should I keep my medication? This medication is given in a hospital or clinic. It will not be stored at home. NOTE: This sheet is a summary. It may not cover all possible information. If you have questions about this medicine, talk to your doctor, pharmacist, or health care provider.  2024 Elsevier/Gold Standard (2021-04-14 00:00:00)   Influenza Vaccine Injection What is this medication? INFLUENZA VACCINE (in floo EN zuh vak SEEN) reduces the risk of the influenza (flu). It does not treat influenza. It is still possible to get influenza after receiving this vaccine, but the symptoms may be less severe or not last as long. It works by helping your immune system learn how to fight off a future infection. This medicine may be used for other purposes; ask your health care provider or pharmacist if you have questions. COMMON BRAND NAME(S): Afluria Quadrivalent, FLUAD Quadrivalent, Fluarix Quadrivalent, Flublok Quadrivalent, FLUCELVAX Quadrivalent, Flulaval Quadrivalent, Fluzone Quadrivalent What should I tell my care team before I take this medication? They need to know if you have any of these conditions: Bleeding disorder like hemophilia Fever  or infection Guillain-Barre syndrome or other neurological problems Immune system problems Infection with the human immunodeficiency virus (HIV) or AIDS Low blood platelet counts Multiple sclerosis An unusual or allergic reaction to influenza virus vaccine, latex, other medications, foods, dyes, or preservatives. Different brands of vaccines contain different allergens. Some may contain latex or eggs. Talk to your care team about your allergies to make sure that you get the right vaccine. Pregnant or trying to get pregnant Breastfeeding How should I use this medication? This vaccine is injected into a muscle or under the skin. It is given by your care team. A copy of Vaccine Information Statements will be given before each vaccination. Be sure to read this sheet carefully each time. This sheet may change often. Talk to your care team to see which vaccines are right for you. Some vaccines should not be used in all age groups. Overdosage: If you think you have taken too much of this medicine contact a poison control center or emergency room at once. NOTE: This medicine is only for you. Do not share this medicine with others. What if I miss a dose? This does not apply. What may interact with this medication? Certain medications that lower your immune system, such as etanercept, anakinra, infliximab, adalimumab Certain medications that prevent or treat blood clots, such as warfarin Chemotherapy or radiation therapy Phenytoin Steroid medications,  such as prednisone or cortisone Theophylline Vaccines This list may not describe all possible interactions. Give your health care provider a list of all the medicines, herbs, non-prescription drugs, or dietary supplements you use. Also tell them if you smoke, drink alcohol, or use illegal drugs. Some items may interact with your medicine. What should I watch for while using this medication? Report any side effects that do not go away with your care  team. Call your care team if any unusual symptoms occur within 6 weeks of receiving this vaccine. You may still catch the flu, but the illness is not usually as bad. You cannot get the flu from the vaccine. The vaccine will not protect against colds or other illnesses that may cause fever. The vaccine is needed every year. What side effects may I notice from receiving this medication? Side effects that you should report to your care team as soon as possible: Allergic reactions--skin rash, itching, hives, swelling of the face, lips, tongue, or throat Side effects that usually do not require medical attention (report these to your care team if they continue or are bothersome): Chills Fatigue Headache Joint pain Loss of appetite Muscle pain Nausea Pain, redness, or irritation at injection site This list may not describe all possible side effects. Call your doctor for medical advice about side effects. You may report side effects to FDA at 1-800-FDA-1088. Where should I keep my medication? The vaccine is only given by your care team. It will not be stored at home. NOTE: This sheet is a summary. It may not cover all possible information. If you have questions about this medicine, talk to your doctor, pharmacist, or health care provider.  2024 Elsevier/Gold Standard (2021-08-01 00:00:00)

## 2023-08-16 NOTE — Progress Notes (Signed)
 [facility] OFFICE PROGRESS NOTE   Diagnosis: Multiple myeloma  INTERVAL HISTORY:   Carrie Huber returns as scheduled.  She feels well.  No recent infection.  She was last treated with Zometa  02/15/2023.  No tooth pain or loosening.  No new complaint.  Objective:  Vital signs in last 24 hours:  Blood pressure (!) 140/77, pulse 72, temperature 98.1 F (36.7 C), resp. rate 18, height 5' 7 (1.702 m), weight 212 lb 6.4 oz (96.3 kg), SpO2 98%.   Resp: Clear bilaterally Cardio: Regular rate and rhythm GI: No hepatosplenomegaly Vascular: The left lower leg is larger than the right side no erythema or edema  Lab Results:  Lab Results  Component Value Date   WBC 4.3 08/16/2023   HGB 13.5 08/16/2023   HCT 40.4 08/16/2023   MCV 89.8 08/16/2023   PLT 184 08/16/2023   NEUTROABS 1.7 08/16/2023    CMP  Lab Results  Component Value Date   NA 141 08/16/2023   K 4.1 08/16/2023   CL 106 08/16/2023   CO2 20 (L) 08/16/2023   GLUCOSE 102 (H) 08/16/2023   BUN 17 08/16/2023   CREATININE 1.06 (H) 08/16/2023   CALCIUM  10.4 (H) 08/16/2023   PROT 7.8 08/16/2023   ALBUMIN 4.3 08/16/2023   AST 25 08/16/2023   ALT 17 08/16/2023   ALKPHOS 70 08/16/2023   BILITOT 0.7 08/16/2023   GFRNONAA 56 (L) 08/16/2023   GFRAA 60 (L) 11/11/2019    Medications: I have reviewed the patient's current medications.   Assessment/Plan: Multiple myeloma, IgG kappa, status post 7 cycles of Revlimid /Decadron and Velcade with marked clinical and laboratory improvement. She is status post melphalan conditioning, followed by an autologous stem cell infusion 09/10/2008. She began maintenance Revlimid  01/04/2009. She has persistent mild elevation of the kappa and lambda light chains. Revlimid  placed on hold at time of TTP diagnosis 08/04/2016 SPEP with no M spike observed; stable mild elevation of lambda light chains 09/26/2016 mild elevation of lambda light chains, IgG in normal range Bone survey  09/09/2017- stable findings of multiple myeloma.  No new osseous abnormalities.  Stable multiple thoracic and lumbar spine compression fractures.  Stable moderate degenerative disc disease at C5-6.  Bone survey 02/09/2019-stable, no new lytic lesions Bone survey 05/10/2020- stable, no new lytic lesions; slight interval increase in endplate depression of T4 superior endplate fracture, nonspecific. Bone survey 08/25/2021-stable lytic lesion anterior calvarium, multiple stable compression deformities in the thoracolumbar spine.  No new lesions are identified. Bone survey 07/23/2022-stable frontal calvarial lytic lesion.  No new lesions. Neutropenia while on Revlimid  at a dose of 10 mg daily. The Revlimid  dose was reduced to 5 mg in September 2011 due to persistent neutropenia and a hospital admission with pneumonia/sepsis.  . Admission 11/03/2009 with left lung pneumonia and sepsis syndrome. No specific organism was cultured during the hospital admission. She completed outpatient Levaquin therapy.   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   Thoracic spine compression fracture, status post a kyphoplasty 12/05/2007. A metastatic bone survey was unchanged on 06/21/2016. Tiny pulmonary embolism on a CT 11/20/2007, now maintained off of Coumadin.   Chronic tachycardia on repeat office visits at the [facility].   History of hypertension. 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 Prednisone  started 08/07/2016  Prednisone  taper to 40 mg daily beginning 08/14/2016 Plasmapheresis 08/15/2016 Plasmapheresis 08/17/2016 Prednisone  taper to 30 mg daily beginning 08/21/2016 Prednisone  tapered to 20 mg daily  beginning 09/03/2016 Prednisone  tapered to 15 mg daily 09/11/2016 Prednisone  tapered to 10 mg daily 09/26/2016 Prednisone  tapered to 5 mg daily beginning 11/02/2016 Prednisone  tapered to 2.5 mg daily for 7 days beginning 11/28/2016, then 2.5 mg every other day for 5 doses, then discontinued   10. Altered mental status-most likely secondary to TTP, resolved   11. Proteinuria on 24-hour urine 08/04/2016 (2.3 grams). Seen by Dr. Vernia Good during hospitalization June 2018. Renal ultrasound 08/09/2016 with no acute abnormality seen. She is followed by Dr. Vernia Good    Disposition: Carrie. Huber appears stable.  She is in clinical remission from multiple myeloma and TTP.  We will follow-up on the myeloma panel from today.  She continues every 12-month Zometa .  She is due for a bone survey and will complete this within the next week.  She will be treated with Zometa  today.  The calcium  level is just above the upper normal range today.  She will return for a chemistry panel in 1 month.  She will remain up-to-date on influenza and pneumonia vaccines.   Carrie. Huber will return for an office visit and Zometa  in 6 months.  I refer her to the cancer center nutritionist for help with a weight loss diet.   Coni Deep, MD  08/16/2023  12:36 PM

## 2023-08-19 ENCOUNTER — Telehealth: Payer: Self-pay | Admitting: Oncology

## 2023-08-19 LAB — KAPPA/LAMBDA LIGHT CHAINS
Kappa free light chain: 23.7 mg/L — ABNORMAL HIGH (ref 3.3–19.4)
Kappa, lambda light chain ratio: 0.92 (ref 0.26–1.65)
Lambda free light chains: 25.8 mg/L (ref 5.7–26.3)

## 2023-08-19 LAB — MULTIPLE MYELOMA PANEL, SERUM
Albumin SerPl Elph-Mcnc: 3.6 g/dL (ref 2.9–4.4)
Albumin/Glob SerPl: 1.1 (ref 0.7–1.7)
Alpha 1: 0.2 g/dL (ref 0.0–0.4)
Alpha2 Glob SerPl Elph-Mcnc: 0.8 g/dL (ref 0.4–1.0)
B-Globulin SerPl Elph-Mcnc: 1.2 g/dL (ref 0.7–1.3)
Gamma Glob SerPl Elph-Mcnc: 1.2 g/dL (ref 0.4–1.8)
Globulin, Total: 3.4 g/dL (ref 2.2–3.9)
IgA: 402 mg/dL (ref 64–422)
IgG (Immunoglobin G), Serum: 1174 mg/dL (ref 586–1602)
IgM (Immunoglobulin M), Srm: 58 mg/dL (ref 26–217)
Total Protein ELP: 7 g/dL (ref 6.0–8.5)

## 2023-08-19 NOTE — Telephone Encounter (Signed)
 Patient has been scheduled for follow-up visit per 08/16/23 LOS.  Pt noted appt details on personal planner/calendar.

## 2023-09-13 ENCOUNTER — Telehealth: Payer: Self-pay | Admitting: Nutrition

## 2023-09-13 NOTE — Telephone Encounter (Signed)
 Received message from nutrition, [facility]. Referral placed as wt loss consultation and our nurtrionists do no manage this. Sending referral to [facility] to help navigate and message. LMOVM for patient to make aware that we are cancelling this appt and I will route made referral to the appropriate place. If she would like to call them, the number is [phone].

## 2023-09-16 ENCOUNTER — Other Ambulatory Visit: Payer: Self-pay

## 2023-09-17 ENCOUNTER — Telehealth: Payer: Self-pay | Admitting: Oncology

## 2023-09-18 ENCOUNTER — Encounter: Payer: Self-pay | Admitting: Nurse Practitioner

## 2023-09-23 ENCOUNTER — Inpatient Hospital Stay: Payer: Self-pay | Attending: Oncology

## 2023-09-23 ENCOUNTER — Ambulatory Visit: Payer: Self-pay | Admitting: Oncology

## 2023-09-23 ENCOUNTER — Encounter: Payer: Self-pay | Admitting: Nutrition

## 2023-09-23 DIAGNOSIS — C9000 Multiple myeloma not having achieved remission: Secondary | ICD-10-CM | POA: Insufficient documentation

## 2023-09-23 DIAGNOSIS — C9001 Multiple myeloma in remission: Secondary | ICD-10-CM

## 2023-09-23 LAB — BASIC METABOLIC PANEL - CANCER CENTER ONLY
Anion gap: 11 (ref 5–15)
BUN: 15 mg/dL (ref 8–23)
CO2: 20 mmol/L — ABNORMAL LOW (ref 22–32)
Calcium: 9.6 mg/dL (ref 8.9–10.3)
Chloride: 109 mmol/L (ref 98–111)
Creatinine: 1.21 mg/dL — ABNORMAL HIGH (ref 0.44–1.00)
GFR, Estimated: 48 mL/min — ABNORMAL LOW (ref >60)
Glucose, Bld: 102 mg/dL — ABNORMAL HIGH (ref 70–99)
Potassium: 3.7 mmol/L (ref 3.5–5.1)
Sodium: 141 mmol/L (ref 135–145)

## 2023-09-23 NOTE — Telephone Encounter (Signed)
 Patient gave verbal understanding had no further questions or concerns.

## 2023-09-23 NOTE — Telephone Encounter (Signed)
-----   Message from Arley Hof sent at 09/23/2023  1:57 PM EDT ----- Please call patient, the calcium  level is normal, follow-up as scheduled  ----- Message ----- From: Rebecka, [facility] [place] Sent: 09/23/2023  11:32 AM EDT To: Arley KATHEE Hof, MD

## 2023-12-21 ENCOUNTER — Encounter: Payer: Self-pay | Admitting: Nurse Practitioner

## 2023-12-21 ENCOUNTER — Ambulatory Visit (HOSPITAL_BASED_OUTPATIENT_CLINIC_OR_DEPARTMENT_OTHER)
Admission: RE | Admit: 2023-12-21 | Discharge: 2023-12-21 | Disposition: A | Discharge status: Home / Self Care | Source: Ambulatory Visit | Attending: Oncology | Admitting: Oncology

## 2023-12-21 DIAGNOSIS — C9001 Multiple myeloma in remission: Secondary | ICD-10-CM | POA: Diagnosis present

## 2024-02-17 ENCOUNTER — Inpatient Hospital Stay: Payer: Self-pay | Attending: Oncology | Admitting: Oncology

## 2024-02-17 ENCOUNTER — Inpatient Hospital Stay: Payer: Self-pay

## 2024-02-17 VITALS — BP 145/90 | HR 73 | Temp 97.8°F | Resp 18 | Ht 67.0 in | Wt 205.0 lb

## 2024-02-17 VITALS — BP 135/76 | HR 64 | Temp 97.7°F | Resp 18

## 2024-02-17 DIAGNOSIS — C9001 Multiple myeloma in remission: Secondary | ICD-10-CM

## 2024-02-17 DIAGNOSIS — C9000 Multiple myeloma not having achieved remission: Secondary | ICD-10-CM | POA: Diagnosis present

## 2024-02-17 LAB — CBC WITH DIFFERENTIAL (CANCER CENTER ONLY)
Abs Immature Granulocytes: 0.01 K/uL (ref 0.00–0.07)
Basophils Absolute: 0 K/uL (ref 0.0–0.1)
Basophils Relative: 1 %
Eosinophils Absolute: 0.1 K/uL (ref 0.0–0.5)
Eosinophils Relative: 2 %
HCT: 37.7 % (ref 36.0–46.0)
Hemoglobin: 12.5 g/dL (ref 12.0–15.0)
Immature Granulocytes: 0 %
Lymphocytes Relative: 55 %
Lymphs Abs: 2.4 K/uL (ref 0.7–4.0)
MCH: 30.2 pg (ref 26.0–34.0)
MCHC: 33.2 g/dL (ref 30.0–36.0)
MCV: 91.1 fL (ref 80.0–100.0)
Monocytes Absolute: 0.5 K/uL (ref 0.1–1.0)
Monocytes Relative: 10 %
Neutro Abs: 1.4 K/uL — ABNORMAL LOW (ref 1.7–7.7)
Neutrophils Relative %: 32 %
Platelet Count: 154 K/uL (ref 150–400)
RBC: 4.14 MIL/uL (ref 3.87–5.11)
RDW: 13.5 % (ref 11.5–15.5)
WBC Count: 4.3 K/uL (ref 4.0–10.5)
nRBC: 0 % (ref 0.0–0.2)

## 2024-02-17 LAB — CMP (CANCER CENTER ONLY)
ALT: 12 U/L (ref 0–44)
AST: 21 U/L (ref 15–41)
Albumin: 3.9 g/dL (ref 3.5–5.0)
Alkaline Phosphatase: 63 U/L (ref 38–126)
Anion gap: 10 (ref 5–15)
BUN: 14 mg/dL (ref 8–23)
CO2: 23 mmol/L (ref 22–32)
Calcium: 9.9 mg/dL (ref 8.9–10.3)
Chloride: 108 mmol/L (ref 98–111)
Creatinine: 1.07 mg/dL — ABNORMAL HIGH (ref 0.44–1.00)
GFR, Estimated: 55 mL/min — ABNORMAL LOW (ref >60)
Glucose, Bld: 94 mg/dL (ref 70–99)
Potassium: 4.1 mmol/L (ref 3.5–5.1)
Sodium: 141 mmol/L (ref 135–145)
Total Bilirubin: 0.6 mg/dL (ref 0.0–1.2)
Total Protein: 7.1 g/dL (ref 6.5–8.1)

## 2024-02-17 MED ORDER — SODIUM CHLORIDE 0.9 % IV SOLN: INTRAVENOUS | Status: DC

## 2024-02-17 MED ORDER — ZOLEDRONIC ACID 4 MG/100ML IV SOLN
4.0000 mg | Freq: Once | INTRAVENOUS | Status: AC
  Administered 2024-02-17: 11:00:00 4 mg via INTRAVENOUS
  Filled 2024-02-17: qty 100

## 2024-02-17 NOTE — Patient Instructions (Signed)

## 2024-02-17 NOTE — Progress Notes (Signed)
 Patient seen by Dr. Arley Hof today  Vitals are within treatment parameters:Yes OK to treat w/BP 145/90  Labs are within treatment parameters: Yes   Treatment plan has been signed: Yes   Per physician team, Patient is ready for treatment and there are NO modifications to the treatment plan. Zometa  today

## 2024-02-17 NOTE — Progress Notes (Signed)
 [facility] OFFICE PROGRESS NOTE   Diagnosis: Multiple myeloma  INTERVAL HISTORY:   Carrie Huber returns as scheduled.  She feels well.  No recent infection.  No pain.  No dyspnea or cough.  She last received Zometa  08/16/2023.  No jaw or tooth pain/loosening.  Objective:  Vital signs in last 24 hours:  Blood pressure (!) 145/90, pulse 73, temperature 97.8 F (36.6 C), temperature source Temporal, resp. rate 18, height 5' 7 (1.702 m), weight 205 lb (93 kg), SpO2 100%.    Resp: End inspiratory rhonchi at the left posterior base, no respiratory distress Cardio: Regular rate and rhythm GI: No hepatosplenomegaly Vascular: No leg edema, left lower leg is slightly larger than the right side   Lab Results:  Lab Results  Component Value Date   WBC 4.3 02/17/2024   HGB 12.5 02/17/2024   HCT 37.7 02/17/2024   MCV 91.1 02/17/2024   PLT 154 02/17/2024   NEUTROABS 1.4 (L) 02/17/2024    CMP  Lab Results  Component Value Date   NA 141 02/17/2024   K 4.1 02/17/2024   CL 108 02/17/2024   CO2 23 02/17/2024   GLUCOSE 94 02/17/2024   BUN 14 02/17/2024   CREATININE 1.07 (H) 02/17/2024   CALCIUM  9.9 02/17/2024   PROT 7.1 02/17/2024   ALBUMIN 3.9 02/17/2024   AST 21 02/17/2024   ALT 12 02/17/2024   ALKPHOS 63 02/17/2024   BILITOT 0.6 02/17/2024   GFRNONAA 55 (L) 02/17/2024   GFRAA 60 (L) 11/11/2019     Medications: I have reviewed the patient's current medications.   Assessment/Plan: Multiple myeloma, IgG kappa, status post 7 cycles of Revlimid /Decadron and Velcade with marked clinical and laboratory improvement. She is status post melphalan conditioning, followed by an autologous stem cell infusion 09/10/2008. She began maintenance Revlimid  01/04/2009. She has persistent mild elevation of the kappa and lambda light chains. Revlimid  placed on hold at time of TTP diagnosis 08/04/2016 SPEP with no M spike observed; stable mild elevation of lambda light  chains 09/26/2016 mild elevation of lambda light chains, IgG in normal range Bone survey 09/09/2017- stable findings of multiple myeloma.  No new osseous abnormalities.  Stable multiple thoracic and lumbar spine compression fractures.  Stable moderate degenerative disc disease at C5-6.  Bone survey 02/09/2019-stable, no new lytic lesions Bone survey 05/10/2020- stable, no new lytic lesions; slight interval increase in endplate depression of T4 superior endplate fracture, nonspecific. Bone survey 08/25/2021-stable lytic lesion anterior calvarium, multiple stable compression deformities in the thoracolumbar spine.  No new lesions are identified. Bone survey 07/23/2022-stable frontal calvarial lytic lesion.  No new lesions. Bone survey 12/22/2023: Stable lytic lesion in the frontal bone, no new lesion Neutropenia while on Revlimid  at a dose of 10 mg daily. The Revlimid  dose was reduced to 5 mg in September 2011 due to persistent neutropenia and a hospital admission with pneumonia/sepsis.  . Admission 11/03/2009 with left lung pneumonia and sepsis syndrome. No specific organism was cultured during the hospital admission. She completed outpatient Levaquin therapy.   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   Thoracic spine compression fracture, status post a kyphoplasty 12/05/2007. A metastatic bone survey was unchanged on 06/21/2016. Tiny pulmonary embolism on a CT 11/20/2007, now maintained off of Coumadin.   Chronic tachycardia on repeat office visits at the [facility].   History of hypertension. Admission 08/03/2016 with severe anemia/thrombocytopenia-clinical presentation and laboratory studies consistent with a diagnosis  of TTP; Adamts 13 activity less than 2% Daily plasmapheresis initiated 08/04/2016, daily treatment #6 08/09/2016; every other day plasmapheresis beginning 08/11/2016. Prednisone  started 08/07/2016  Prednisone  taper to 40 mg  daily beginning 08/14/2016 Plasmapheresis 08/15/2016 Plasmapheresis 08/17/2016 Prednisone  taper to 30 mg daily beginning 08/21/2016 Prednisone  tapered to 20 mg daily beginning 09/03/2016 Prednisone  tapered to 15 mg daily 09/11/2016 Prednisone  tapered to 10 mg daily 09/26/2016 Prednisone  tapered to 5 mg daily beginning 11/02/2016 Prednisone  tapered to 2.5 mg daily for 7 days beginning 11/28/2016, then 2.5 mg every other day for 5 doses, then discontinued   10. Altered mental status-most likely secondary to TTP, resolved   11. Proteinuria on 24-hour urine 08/04/2016 (2.3 grams). Seen by Dr. Betsey during hospitalization June 2018. Renal ultrasound 08/09/2016 with no acute abnormality seen. She is followed by Dr. Betsey     Disposition: Carrie Huber remains in clinical remission from multiple myeloma.  She continues every 6 months Zometa .  She will receive Zometa  today.  I recommended she remain up-to-date on pneumonia and influenza vaccines.  She will obtain a pneumococcal 20 or 21 vaccine.  Carrie Huber will return for an office visit and Zometa  in 6 months.  Carrie Hof, MD  02/17/2024  9:29 AM

## 2024-02-18 LAB — KAPPA/LAMBDA LIGHT CHAINS
Kappa free light chain: 24.2 mg/L — ABNORMAL HIGH (ref 3.3–19.4)
Kappa, lambda light chain ratio: 0.88 (ref 0.26–1.65)
Lambda free light chains: 27.6 mg/L — ABNORMAL HIGH (ref 5.7–26.3)

## 2024-02-18 LAB — IGG: IgG (Immunoglobin G), Serum: 1200 mg/dL (ref 586–1602)

## 2024-02-19 LAB — PROTEIN ELECTROPHORESIS, SERUM
A/G Ratio: 1 (ref 0.7–1.7)
Albumin ELP: 3.3 g/dL (ref 2.9–4.4)
Alpha-1-Globulin: 0.2 g/dL (ref 0.0–0.4)
Alpha-2-Globulin: 0.8 g/dL (ref 0.4–1.0)
Beta Globulin: 1.3 g/dL (ref 0.7–1.3)
Gamma Globulin: 1.2 g/dL (ref 0.4–1.8)
Globulin, Total: 3.4 g/dL (ref 2.2–3.9)
M-Spike, %: 0.1 g/dL — ABNORMAL HIGH
Total Protein ELP: 6.7 g/dL (ref 6.0–8.5)

## 2024-02-21 ENCOUNTER — Telehealth: Payer: Self-pay | Admitting: *Deleted

## 2024-02-21 DIAGNOSIS — C9000 Multiple myeloma not having achieved remission: Secondary | ICD-10-CM | POA: Diagnosis not present

## 2024-02-21 DIAGNOSIS — C9001 Multiple myeloma in remission: Secondary | ICD-10-CM

## 2024-02-21 NOTE — Telephone Encounter (Signed)
 Called Carrie Huber and made her aware of very low protein on SPEP and MD wants to recheck in 3 months. She agrees to this plan as well. Added serum immunofixation (IFE) to lab drawn on 12/15 per MD request. Lab called to add on. Scheduling message sent.

## 2024-02-24 ENCOUNTER — Ambulatory Visit: Payer: Self-pay | Admitting: Oncology

## 2024-02-24 LAB — IMMUNOFIXATION ELECTROPHORESIS

## 2024-02-25 ENCOUNTER — Encounter: Payer: Self-pay | Admitting: Nurse Practitioner

## 2024-03-26 ENCOUNTER — Telehealth: Payer: Self-pay | Admitting: Oncology

## 2024-03-26 NOTE — Telephone Encounter (Signed)
 Scheduled appointment per 12/19 scheduling message. Talked with the patient and she is aware of the made appointment.

## 2024-05-18 ENCOUNTER — Inpatient Hospital Stay
